# Patient Record
Sex: Male | Born: 1970 | Race: Black or African American | Hispanic: No | Smoking: Former smoker
Health system: Southern US, Community
[De-identification: ages and names within clinical notes are randomized; demographics above are authoritative.]

## PROBLEM LIST (undated history)

## (undated) DIAGNOSIS — Z8669 Personal history of other diseases of the nervous system and sense organs: Secondary | ICD-10-CM

## (undated) DIAGNOSIS — F329 Major depressive disorder, single episode, unspecified: Secondary | ICD-10-CM

## (undated) DIAGNOSIS — F419 Anxiety disorder, unspecified: Secondary | ICD-10-CM

## (undated) DIAGNOSIS — F32A Depression, unspecified: Secondary | ICD-10-CM

## (undated) DIAGNOSIS — T7840XA Allergy, unspecified, initial encounter: Secondary | ICD-10-CM

## (undated) DIAGNOSIS — K219 Gastro-esophageal reflux disease without esophagitis: Secondary | ICD-10-CM

## (undated) DIAGNOSIS — G4733 Obstructive sleep apnea (adult) (pediatric): Secondary | ICD-10-CM

## (undated) DIAGNOSIS — E7800 Pure hypercholesterolemia, unspecified: Secondary | ICD-10-CM

## (undated) DIAGNOSIS — F3342 Major depressive disorder, recurrent, in full remission: Secondary | ICD-10-CM

## (undated) DIAGNOSIS — I10 Essential (primary) hypertension: Secondary | ICD-10-CM

## (undated) DIAGNOSIS — R079 Chest pain, unspecified: Secondary | ICD-10-CM

## (undated) HISTORY — DX: Major depressive disorder, single episode, unspecified: F32.9

## (undated) HISTORY — DX: Depression, unspecified: F32.A

## (undated) HISTORY — DX: Major depressive disorder, recurrent, in full remission: F33.42

## (undated) HISTORY — DX: Allergy, unspecified, initial encounter: T78.40XA

## (undated) HISTORY — DX: Anxiety disorder, unspecified: F41.9

## (undated) HISTORY — DX: Obstructive sleep apnea (adult) (pediatric): G47.33

## (undated) HISTORY — PX: TONSILLECTOMY: SUR1361

## (undated) HISTORY — DX: Chest pain, unspecified: R07.9

## (undated) HISTORY — DX: Gastro-esophageal reflux disease without esophagitis: K21.9

## (undated) HISTORY — DX: Personal history of other diseases of the nervous system and sense organs: Z86.69

---

## 1998-03-12 ENCOUNTER — Encounter: Admission: RE | Admit: 1998-03-12 | Discharge: 1998-03-12 | Payer: Self-pay | Admitting: *Deleted

## 1998-03-14 ENCOUNTER — Encounter: Admission: RE | Admit: 1998-03-14 | Discharge: 1998-03-14 | Payer: Self-pay | Admitting: *Deleted

## 2000-07-05 HISTORY — PX: FOOT SURGERY: SHX648

## 2010-08-11 ENCOUNTER — Ambulatory Visit: Payer: Self-pay | Admitting: Internal Medicine

## 2011-08-10 ENCOUNTER — Ambulatory Visit: Payer: Self-pay | Admitting: Internal Medicine

## 2012-07-25 ENCOUNTER — Ambulatory Visit: Payer: Self-pay | Admitting: Internal Medicine

## 2015-02-03 ENCOUNTER — Emergency Department
Admission: EM | Admit: 2015-02-03 | Discharge: 2015-02-03 | Disposition: A | Discharge status: Home / Self Care | Attending: Emergency Medicine | Admitting: Emergency Medicine

## 2015-02-03 ENCOUNTER — Other Ambulatory Visit: Payer: Self-pay

## 2015-02-03 ENCOUNTER — Encounter: Payer: Self-pay | Admitting: *Deleted

## 2015-02-03 ENCOUNTER — Emergency Department

## 2015-02-03 DIAGNOSIS — R110 Nausea: Secondary | ICD-10-CM | POA: Insufficient documentation

## 2015-02-03 DIAGNOSIS — I10 Essential (primary) hypertension: Secondary | ICD-10-CM | POA: Diagnosis not present

## 2015-02-03 DIAGNOSIS — R079 Chest pain, unspecified: Secondary | ICD-10-CM | POA: Diagnosis not present

## 2015-02-03 HISTORY — DX: Essential (primary) hypertension: I10

## 2015-02-03 HISTORY — DX: Pure hypercholesterolemia, unspecified: E78.00

## 2015-02-03 LAB — BASIC METABOLIC PANEL
Anion gap: 10 (ref 5–15)
BUN: 6 mg/dL (ref 6–20)
CO2: 24 mmol/L (ref 22–32)
Calcium: 8.5 mg/dL — ABNORMAL LOW (ref 8.9–10.3)
Chloride: 99 mmol/L — ABNORMAL LOW (ref 101–111)
Creatinine, Ser: 1.04 mg/dL (ref 0.61–1.24)
GFR calc Af Amer: >60 mL/min (ref >60)
GFR calc non Af Amer: >60 mL/min (ref >60)
Glucose, Bld: 97 mg/dL (ref 65–99)
Potassium: 3.2 mmol/L — ABNORMAL LOW (ref 3.5–5.1)
Sodium: 133 mmol/L — ABNORMAL LOW (ref 135–145)

## 2015-02-03 LAB — CBC
HCT: 39.5 % — ABNORMAL LOW (ref 40.0–52.0)
Hemoglobin: 12.8 g/dL — ABNORMAL LOW (ref 13.0–18.0)
MCH: 21.5 pg — ABNORMAL LOW (ref 26.0–34.0)
MCHC: 32.4 g/dL (ref 32.0–36.0)
MCV: 66.3 fL — ABNORMAL LOW (ref 80.0–100.0)
Platelets: 293 10*3/uL (ref 150–440)
RBC: 5.96 MIL/uL — ABNORMAL HIGH (ref 4.40–5.90)
RDW: 15.5 % — ABNORMAL HIGH (ref 11.5–14.5)
WBC: 13.2 10*3/uL — ABNORMAL HIGH (ref 3.8–10.6)

## 2015-02-03 LAB — TROPONIN I: Troponin I: <0.03 ng/mL (ref <0.031)

## 2015-02-03 MED ORDER — CLONIDINE HCL 0.1 MG PO TABS
0.1000 mg | ORAL_TABLET | Freq: Once | ORAL | Status: AC
  Administered 2015-02-03: 0.1 mg via ORAL
  Filled 2015-02-03: qty 1

## 2015-02-03 MED ORDER — AZILSARTAN-CHLORTHALIDONE 40-25 MG PO TABS: 1.0000 | ORAL_TABLET | Freq: Every day | ORAL | Status: DC

## 2015-02-03 MED ORDER — RANITIDINE HCL 75 MG PO TABS
75.0000 mg | ORAL_TABLET | Freq: Two times a day (BID) | ORAL | Status: DC
Start: 2015-02-03 — End: 2018-03-27

## 2015-02-03 NOTE — Discharge Instructions (Signed)

## 2015-02-03 NOTE — ED Notes (Signed)
Patient with no complaints at this time. Respirations even and unlabored. Skin warm/dry. Discharge instructions reviewed with patient at this time. Patient given opportunity to voice concerns/ask questions. Patient discharged at this time and left Emergency Department with steady gait.   

## 2015-02-03 NOTE — ED Notes (Signed)
Pt states that for several months he has had mid chest/left sided chest pain that has been "on and off". Pt has had nausea with the cp. Denies dizziness or sob.

## 2015-02-03 NOTE — ED Provider Notes (Signed)
[facility] Emergency Department Provider Note  ____________________________________________  Time seen: Approximately 9:20 PM  I have reviewed the triage vital signs and the nursing notes.   HISTORY  Chief Complaint Chest Pain    HPI Joseph Wilcox is a 44 y.o. male with a history of hypertension and high cholesterol who presents with a chief complaint of chest pain which is been ongoing for the past several months. He says that the pain is sharp and can last anywhere from several minutes to several hours. It is on the left lower part of his chest. It is not exertional or worsened by movement. He says that he has been working out and engaging in physical activity without any chest pain or shortness of breath. He says that it can come on at random times, such as when he is sitting watching television. He also says that he thinks he has a history of reflux. He tries taking Tums for relief of his pain, but still wakes up in the middle the night with a burning type pain usually in his throat. He says that this is worsened when he drinks orange juice or has tomato sauce. Has had nausea but no vomiting. No diaphoresis. No shortness of breath. No history of cardiac disease in his family. Does have a remote smoking history but has not smoked in 2 years. Says that he sees Dr. Doy Hutching at the [place] [facility]. However, he has not taken his medications in one week because he says he is out of them. He is asymptomatic at this time. He said he came to the emergency department today because his coworkers as well as his wife encouraged him to because of his chest pain which has been ongoing for a long time. He has no history of cardiac disease in his family.  Past Medical History  Diagnosis Date  . Hypertension   . High cholesterol     There are no active problems to display for this patient.   Past Surgical History  Procedure Laterality Date  . Foot surgery    . Tonsillectomy       Current Outpatient Rx  Name  Route  Sig  Dispense  Refill  . Azilsartan-Chlorthalidone (EDARBYCLOR) 40-12.5 MG TABS   Oral   Take by mouth.           Allergies Review of patient's allergies indicates no known allergies.  No family history on file.  Social History History  Substance Use Topics  . Smoking status: Never Smoker   . Smokeless tobacco: Not on file  . Alcohol Use: Yes    Review of Systems Constitutional: No fever/chills Eyes: No visual changes. ENT: No sore throat. Cardiovascular: As above Respiratory: Denies shortness of breath. Gastrointestinal: No abdominal pain.  , no vomiting.  No diarrhea.  No constipation. Genitourinary: Negative for dysuria. Musculoskeletal: Negative for back pain. Skin: Negative for rash. Neurological: Negative for headaches, focal weakness or numbness.  10-point ROS otherwise negative.  ____________________________________________   PHYSICAL EXAM:  VITAL SIGNS: ED Triage Vitals  Enc Vitals Group     BP 02/03/15 1848 151/106 mmHg     Pulse Rate 02/03/15 1848 75     Resp 02/03/15 1858 18     Temp 02/03/15 1848 98.2 F (36.8 C)     Temp Source 02/03/15 1848 Oral     SpO2 02/03/15 1848 95 %     Weight 02/03/15 1848 240 lb (108.863 kg)     Height 02/03/15 1848 6'  1" (1.854 m)     Head Cir --      Peak Flow --      Pain Score 02/03/15 1846 7     Pain Loc --      Pain Edu? --      Excl. in [place]? --     Constitutional: Alert and oriented. Well appearing and in no acute distress. Eyes: Conjunctivae are normal. PERRL. EOMI. Head: Atraumatic. Nose: No congestion/rhinnorhea. Mouth/Throat: Mucous membranes are moist.  Oropharynx non-erythematous. Neck: No stridor.   Cardiovascular: Normal rate, regular rhythm. Grossly normal heart sounds.  Good peripheral circulation. reproducible chest pain over the left lateral chest. Air is no crepitus.   Respiratory: Normal respiratory effort.  No retractions. Lungs  CTAB. Gastrointestinal: Soft and nontender. No distention. No abdominal bruits. No CVA tenderness. Musculoskeletal: No lower extremity tenderness nor edema.  No joint effusions. Neurologic:  Normal speech and language. No gross focal neurologic deficits are appreciated. No gait instability. Skin:  Skin is warm, dry and intact. No rash noted. Psychiatric: Mood and affect are normal. Speech and behavior are normal.  ____________________________________________   LABS (all labs ordered are listed, but only abnormal results are displayed)  Labs Reviewed  BASIC METABOLIC PANEL - Abnormal; Notable for the following:    Sodium 133 (*)    Potassium 3.2 (*)    Chloride 99 (*)    Calcium 8.5 (*)    All other components within normal limits  CBC - Abnormal; Notable for the following:    WBC 13.2 (*)    RBC 5.96 (*)    Hemoglobin 12.8 (*)    HCT 39.5 (*)    MCV 66.3 (*)    MCH 21.5 (*)    RDW 15.5 (*)    All other components within normal limits  TROPONIN I   ____________________________________________  EKG  ED ECG REPORT I, Doran Stabler, the attending physician, personally viewed and interpreted this ECG.   Date: 02/03/2015  EKG Time: 1847  Rate: 80  Rhythm: normal sinus rhythm  Axis: Normal axis  Intervals: Incomplete right bundle-branch block.   ST&T Change:  No ST elevations or depressions. No abnormal T-wave inversions.  ____________________________________________  RADIOLOGYminimal chronic bronchitic changes. No acute abdomen. I personally reviewed these images. ____________________________________________   PROCEDURES    ____________________________________________   INITIAL IMPRESSION / ASSESSMENT AND PLAN / ED COURSE  Pertinent labs & imaging results that were available during my care of the patient were reviewed by me and considered in my medical decision making (see chart for details). patient chest pain atypical for cardiac chest pain. We will  refill the patient's medication as well as given a prescription for antacids. Encouraged the patient that he needs to follow-up with his primary care doctor as well as cardiology. Given numbers on the discharge instructions. Heart score of 2 which makes the patient appropriate for outpatient follow-up. _________________________________________   FINAL CLINICAL IMPRESSION(S) / ED DIAGNOSES  Acute chest pain. Initial visit.    Orbie Pyo, MD 02/03/15 2138

## 2015-06-04 ENCOUNTER — Ambulatory Visit: Payer: Self-pay | Admitting: Unknown Physician Specialty

## 2015-06-14 ENCOUNTER — Ambulatory Visit
Admission: EM | Admit: 2015-06-14 | Discharge: 2015-06-14 | Disposition: A | Discharge status: Home / Self Care | Attending: Family Medicine | Admitting: Family Medicine

## 2015-06-14 ENCOUNTER — Encounter: Payer: Self-pay | Admitting: Gynecology

## 2015-06-14 DIAGNOSIS — H6691 Otitis media, unspecified, right ear: Secondary | ICD-10-CM | POA: Diagnosis not present

## 2015-06-14 DIAGNOSIS — J209 Acute bronchitis, unspecified: Secondary | ICD-10-CM

## 2015-06-14 DIAGNOSIS — J0111 Acute recurrent frontal sinusitis: Secondary | ICD-10-CM

## 2015-06-14 DIAGNOSIS — H6592 Unspecified nonsuppurative otitis media, left ear: Secondary | ICD-10-CM | POA: Diagnosis not present

## 2015-06-14 MED ORDER — ALBUTEROL SULFATE HFA 108 (90 BASE) MCG/ACT IN AERS: 1.0000 | INHALATION_SPRAY | Freq: Four times a day (QID) | RESPIRATORY_TRACT | Status: DC | PRN

## 2015-06-14 MED ORDER — BENZONATATE 200 MG PO CAPS: 200.0000 mg | ORAL_CAPSULE | Freq: Three times a day (TID) | ORAL | Status: DC | PRN

## 2015-06-14 MED ORDER — FLUTICASONE PROPIONATE 50 MCG/ACT NA SUSP: 1.0000 | Freq: Two times a day (BID) | NASAL | Status: DC

## 2015-06-14 MED ORDER — AMOXICILLIN-POT CLAVULANATE 875-125 MG PO TABS: 1.0000 | ORAL_TABLET | Freq: Two times a day (BID) | ORAL | Status: AC

## 2015-06-14 MED ORDER — IPRATROPIUM-ALBUTEROL 0.5-2.5 (3) MG/3ML IN SOLN
3.0000 mL | Freq: Four times a day (QID) | RESPIRATORY_TRACT | Status: DC
  Administered 2015-06-14: 3 mL via RESPIRATORY_TRACT

## 2015-06-14 MED ORDER — SALINE SPRAY 0.65 % NA SOLN: 2.0000 | NASAL | Status: DC

## 2015-06-14 MED ORDER — PREDNISONE 20 MG PO TABS: 40.0000 mg | ORAL_TABLET | Freq: Every day | ORAL | Status: AC

## 2015-06-14 NOTE — ED Provider Notes (Signed)
CSN: [id number]     Arrival date & time 06/14/15  1242 History   First MD Initiated Contact with Patient 06/14/15 1406     Chief Complaint  Patient presents with  . 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  Hx asthma as a child no inhaler at home now.  Denied sick contacts headache right temple chills at night started Monday 5 Dec along with vomiting after coughing and decreased appetite has tried mucinex without an relief of symptoms and blood pressure has been up  FHx: HTN  PCM Dr Golden Pop  The history is provided by the patient.    Past Medical History  Diagnosis Date  . Hypertension   . High cholesterol    Past Surgical History  Procedure Laterality Date  . Foot surgery    . Tonsillectomy     History reviewed. No pertinent family history. Social History  Substance Use Topics  . Smoking status: Never Smoker   . Smokeless tobacco: None  . Alcohol Use: Yes    Review of Systems  Constitutional: Positive for chills and appetite change. Negative for fever, diaphoresis, activity change, fatigue and unexpected weight change.  HENT: Positive for congestion, postnasal drip and sinus pressure. Negative for dental problem, drooling, ear discharge, ear pain, facial swelling, hearing loss, mouth sores, nosebleeds, rhinorrhea, sneezing, sore throat, tinnitus, trouble swallowing and voice change.   Eyes: Negative for photophobia, pain, discharge, redness, itching and visual disturbance.  Respiratory: Positive for cough, chest tightness and wheezing. Negative for choking, shortness of breath and stridor.   Cardiovascular: Negative for chest pain, palpitations and leg swelling.  Gastrointestinal: Positive for vomiting. Negative for nausea, abdominal pain, diarrhea, constipation, blood in stool  and abdominal distention.  Endocrine: Negative for cold intolerance and heat intolerance.  Genitourinary: Negative for dysuria.  Musculoskeletal: Negative for myalgias, back pain, joint swelling, arthralgias, gait problem, neck pain and neck stiffness.  Skin: Negative for color change, pallor, rash and wound.  Allergic/Immunologic: Positive for environmental allergies. Negative for food allergies and immunocompromised state.  Neurological: Positive for headaches. Negative for dizziness, tremors, seizures, syncope, facial asymmetry, speech difficulty, weakness, light-headedness and numbness.  Hematological: Negative for adenopathy. Does not bruise/bleed easily.  Psychiatric/Behavioral: Positive for sleep disturbance. Negative for behavioral problems, confusion and agitation.    Allergies  Review of patient's allergies indicates no known allergies.  Home Medications   Prior to Admission medications   Medication Sig Start Date End Date Taking? Authorizing Provider  Azilsartan-Chlorthalidone (EDARBYCLOR) 40-25 MG TABS Take 1 tablet by mouth daily. 02/03/15  Yes Orbie Pyo, MD  albuterol (PROVENTIL HFA;VENTOLIN HFA) 108 (90 BASE) MCG/ACT inhaler Inhale 1-2 puffs into the lungs every 6 (six) hours as needed for wheezing or shortness of breath. 06/14/15   Olen Cordial, NP  amoxicillin-clavulanate (AUGMENTIN) 875-125 MG tablet Take 1 tablet by mouth every 12 (twelve) hours. 06/16/15 06/25/15  Olen Cordial, NP  benzonatate (TESSALON) 200 MG capsule Take 1 capsule (200 mg total) by mouth 3 (three) times daily as needed for cough. 06/14/15   Olen Cordial, NP  fluticasone (FLONASE) 50 MCG/ACT nasal spray Place 1 spray into both nostrils 2 (two) times daily. 06/14/15 07/05/15  Olen Cordial, NP  predniSONE (DELTASONE) 20 MG tablet Take 2 tablets (40 mg total) by mouth daily with breakfast. 06/15/15 06/19/15  Olen Cordial, NP  sodium chloride (OCEAN)  0.65 % SOLN nasal  spray Place 2 sprays into both nostrils every 2 (two) hours while awake. 06/14/15   Olen Cordial, NP   Meds Ordered and Administered this Visit   Medications - No data to display  BP 145/103 mmHg  Pulse 70  Temp(Src) 98.4 F (36.9 C) (Oral)  Resp 18  Ht 6\' 1"  (1.854 m)  Wt 235 lb (106.595 kg)  BMI 31.01 kg/m2  SpO2 100% No data found.   Physical Exam  Constitutional: He is oriented to person, place, and time. Vital signs are normal. He appears well-developed and well-nourished. He is active and cooperative.  Non-toxic appearance. He does not have a sickly appearance. He appears ill. No distress.  HENT:  Head: Normocephalic and atraumatic.  Right Ear: Hearing, external ear and ear canal normal. Tympanic membrane is injected, erythematous and bulging. Tympanic membrane is not perforated. A middle ear effusion is present.  Left Ear: Hearing, external ear and ear canal normal. Tympanic membrane is bulging. Tympanic membrane is not injected, not perforated and not erythematous. A middle ear effusion is present.  Nose: Mucosal edema and rhinorrhea present. No nose lacerations, sinus tenderness, nasal deformity, septal deviation or nasal septal hematoma. No epistaxis.  No foreign bodies. Right sinus exhibits frontal sinus tenderness. Right sinus exhibits no maxillary sinus tenderness. Left sinus exhibits frontal sinus tenderness. Left sinus exhibits no maxillary sinus tenderness.  Mouth/Throat: Uvula is midline and mucous membranes are normal. Mucous membranes are not pale, not dry and not cyanotic. He does not have dentures. No oral lesions. No trismus in the jaw. Normal dentition. No dental abscesses, uvula swelling, lacerations or dental caries. Posterior oropharyngeal edema and posterior oropharyngeal erythema present. No oropharyngeal exudate or tonsillar abscesses.  Cobblestoning posterior pharynx; right TM bulging with erythema/injection slight opacity air fluid level; bilateral nasal  turbinates with edema/bogginess clear/yellow discharge/congestion; left TM slight opacity air fluid level bulging  Eyes: Conjunctivae, EOM and lids are normal. Pupils are equal, round, and reactive to light. Right eye exhibits no chemosis, no discharge, no exudate and no hordeolum. No foreign body present in the right eye. Left eye exhibits no chemosis, no discharge, no exudate and no hordeolum. No foreign body present in the left eye. Right conjunctiva is not injected. Right conjunctiva has no hemorrhage. Left conjunctiva is not injected. Left conjunctiva has no hemorrhage. No scleral icterus. Right eye exhibits normal extraocular motion and no nystagmus. Left eye exhibits normal extraocular motion and no nystagmus. Right pupil is round and reactive. Left pupil is round and reactive. Pupils are equal.  Neck: Trachea normal and normal range of motion. Neck supple. No tracheal tenderness, no spinous process tenderness and no muscular tenderness present. No rigidity. No tracheal deviation, no edema, no erythema and normal range of motion present. No thyroid mass and no thyromegaly present.  Cardiovascular: Normal rate, regular rhythm, S1 normal, S2 normal, normal heart sounds and intact distal pulses.  PMI is not displaced.  Exam reveals no gallop and no friction rub.   No murmur heard. Pulmonary/Chest: Effort normal. No stridor. No respiratory distress. He has decreased breath sounds in the right lower field and the left lower field. He has wheezes in the right upper field and the left upper field. He has rhonchi in the right middle field and the left middle field. He has no rales.  Fine inspiratory wheeze; short rhonchi clear with cough negative egophany all fields  Abdominal: Soft. Bowel sounds are normal. He exhibits no shifting dullness, no  distension, no pulsatile liver, no fluid wave, no abdominal bruit, no ascites, no pulsatile midline mass and no mass. There is no tenderness. There is no rigidity, no  guarding, no tenderness at McBurney's point and negative Murphy's sign. Hernia confirmed negative in the ventral area.  Dull to percussion x 4 quads  Musculoskeletal: Normal range of motion. He exhibits no edema or tenderness.       Right shoulder: Normal.       Left shoulder: Normal.       Right elbow: Normal.      Left elbow: Normal.       Right knee: Normal.       Left knee: Normal.       Right ankle: Normal.       Left ankle: Normal.       Right hand: Normal.       Left hand: Normal.  Lymphadenopathy:       Head (right side): No submental, no submandibular, no tonsillar, no preauricular, no posterior auricular and no occipital adenopathy present.       Head (left side): No submental, no submandibular, no tonsillar, no preauricular, no posterior auricular and no occipital adenopathy present.    He has no cervical adenopathy.       Right cervical: No superficial cervical, no deep cervical and no posterior cervical adenopathy present.      Left cervical: No superficial cervical, no deep cervical and no posterior cervical adenopathy present.  Neurological: He is alert and oriented to person, place, and time. He displays no atrophy and no tremor. No cranial nerve deficit or sensory deficit. He exhibits normal muscle tone. He displays no seizure activity. Coordination and gait normal. GCS eye subscore is 4. GCS verbal subscore is 5. GCS motor subscore is 6.  Skin: Skin is warm, dry and intact. No abrasion, no bruising, no burn, no ecchymosis, no laceration, no lesion, no petechiae and no rash noted. He is not diaphoretic. No cyanosis or erythema. No pallor. Nails show no clubbing.  Psychiatric: He has a normal mood and affect. His speech is normal and behavior is normal. Judgment and thought content normal. Cognition and memory are normal.  Nursing note and vitals reviewed.   ED Course  Procedures (including critical care time)  Labs Review Labs Reviewed - No data to display  Imaging  Review No results found. 1454 duoneb administered by RN Tula Nakayama  [phone] feels less chest congestion after duoneb; phlegm looser still has fine wheeze and occasional rhonchi  sp02  98% room air BP 142/85 HR 74   Patient to monitor blood pressure at home and follow up with PCM discussed goal 140/90 or less avoid sudafed/motrin/aleve/advil/naproxen as counteracts his blood pressure medication  Discussed albuterol can cause hypokalemia along with his blood pressure medication discussed potassium rich foods signs/symptoms hypokalemia and to follow up for re-evaluation if these symptoms occur.  Patient verbalized understanding of information/instructions, agreed with plan of care and had no further questions at this time.   MDM   1. Acute recurrent frontal sinusitis   2. Acute bronchitis, unspecified organism   3. Acute right otitis media, recurrence not specified, unspecified otitis media type   4. Otitis media with effusion, left    Start augmentin 875mg  po BID x 10 days for otitis media right.  Supportive treatment.   No evidence of invasive bacterial infection, non toxic and well hydrated.  This is most likely self limiting viral infection.  I do not see  where any further testing or imaging is necessary at this time.   I will suggest supportive care, rest, good hygiene and encourage the patient to take adequate fluids.  The patient is to return to clinic or EMERGENCY ROOM if symptoms worsen or change significantly e.g. ear pain, fever, purulent discharge from ears or bleeding.  Exitcare handout on otitis media with effusion and otitis media given to patient.  Patient verbalized agreement and understanding of treatment plan.    Suspect Viral illness: no evidence of invasive bacterial infection, non toxic and well hydrated.  This is most likely self limiting viral infection.  I do not see where any further testing or imaging is necessary at this time.   I will suggest supportive care, rest, good  hygiene and encourage the patient to take adequate fluids.  Does not require work excuse.  Rx flonase 1 spray each nostril BID prn, nasal saline 1-2 sprays each nostril prn q2h, tylenol 1000mg  po QID prn pain/fever.  Avoid motrin/naproxen as counteracts blood pressure medications.  Tessalon pearles 200mg  po TID prn cough.  Prednisone 40mg  po daily x 5 days history of asthma as child.  Albuterol 1-2 puffs po q4-6h prn cough/wheezing/chest tightness.  Discussed honey with lemon and salt water gargles for comfort also.  The patient is to return to clinic or EMERGENCY ROOM if symptoms worsen or change significantly e.g. fever, lethargy, SOB, wheezing.  Exitcare handout on viral illness given to patient.  Patient verbalized agreement and understanding of treatment plan.    Start flonase if no relief in symptoms x 48 hours please contact me.No evidence of systemic bacterial infection, non toxic and well hydrated.  I do not see where any further testing or imaging is necessary at this time.   I will suggest supportive care, rest, good hygiene and encourage the patient to take adequate fluids.  The patient is to return to clinic or EMERGENCY ROOM if symptoms worsen or change significantly.  Exitcare handout on sinusitis given to patient.  Patient verbalized agreement and understanding of treatment plan and had no further questions at this time.   P2:  Hand washing and cover cough  Rx albuterol inhaler demonstrated use with patient good relief from duoneb and start prednisone 40mg  po with breakfast tomorrow x 5 days.  Bronchitis simple, community acquired, may have started as viral (probably respiratory syncytial, parainfluenza, influenza, or adenovirus), but now evidence of acute purulent bronchitis with resultant bronchial edema and mucus formation.  Viruses are the most common cause of bronchial inflammation in otherwise healthy adults with acute bronchitis.  The appearance of sputum is not predictive of whether a  bacterial infection is present.  Purulent sputum is most often caused by viral infections.  There are a small portion of those caused by non-viral agents being Mycoplamsa pneumonia.  Microscopic examination or C&S of sputum in the healthy adult with acute bronchitis is generally not helpful (usually negative or normal respiratory flora) other considerations being cough from upper respiratory tract infections, sinusitis or allergic syndromes (mild asthma or viral pneumonia).  Differential Diagnosis:  reactive airway disease (asthma, allergic aspergillosis (eosinophilia), chronic bronchitis, respiratory infection (Sinusitis, Common cold, pneumonia), congestive heart failure, reflux esophagitis, bronchogenic tumor, aspiration syndromes and/or exposure irritants/tobacco smoke.  In this case, there is no evidence of any invasive bacterial illness.  Most likely viral etiology so will hold on antibiotic treatment.  Advise supportive care with rest, encourage fluids, good hygiene and watch for any worsening symptoms.  If they were  to develop:  come back to the office or go to the emergency room if after hours.  Without high fever, severe dyspnea, lack of physical findings or other risk factors, I will hold on a chest radiograph and CBC at this time.  I discussed that approximately 50% of patients with acute bronchitis have a cough that lasts up to three weeks, and 25% for over a month.  Tylenol, one to two tablets every four hours as needed for fever or myalgias.   No aspirin.  Patient instructed to follow up in one week or sooner if symptoms worsen. Patient verbalized agreement and understanding of treatment plan.  P2:  hand washing and cover cough   Olen Cordial, NP 06/15/15 [phone]

## 2015-06-14 NOTE — ED Notes (Signed)
Patient c/o cough / congestion / chills / nasal drainage x 2 weeks

## 2015-06-14 NOTE — Discharge Instructions (Signed)
Acute Bronchitis °Bronchitis is inflammation of the airways that extend from the windpipe into the lungs (bronchi). The inflammation often causes mucus to develop. This leads to a cough, which is the most common symptom of bronchitis.  °In acute bronchitis, the condition usually develops suddenly and goes away over time, usually in a couple weeks. Smoking, allergies, and asthma can make bronchitis worse. Repeated episodes of bronchitis may cause further lung problems.  °CAUSES °Acute bronchitis is most often caused by the same virus that causes a cold. The virus can spread from person to person (contagious) through coughing, sneezing, and touching contaminated objects. °SIGNS AND SYMPTOMS  °· Cough.   °· Fever.   °· Coughing up mucus.   °· Body aches.   °· Chest congestion.   °· Chills.   °· Shortness of breath.   °· Sore throat.   °DIAGNOSIS  °Acute bronchitis is usually diagnosed through a physical exam. Your health care provider will also ask you questions about your medical history. Tests, such as chest X-rays, are sometimes done to rule out other conditions.  °TREATMENT  °Acute bronchitis usually goes away in a couple weeks. Oftentimes, no medical treatment is necessary. Medicines are sometimes given for relief of fever or cough. Antibiotic medicines are usually not needed but may be prescribed in certain situations. In some cases, an inhaler may be recommended to help reduce shortness of breath and control the cough. A cool mist vaporizer may also be used to help thin bronchial secretions and make it easier to clear the chest.  °HOME CARE INSTRUCTIONS °· Get plenty of rest.   °· Drink enough fluids to keep your urine clear or pale yellow (unless you have a medical condition that requires fluid restriction). Increasing fluids may help thin your respiratory secretions (sputum) and reduce chest congestion, and it will prevent dehydration.   °· Take medicines only as directed by your health care provider. °· If  you were prescribed an antibiotic medicine, finish it all even if you start to feel better. °· Avoid smoking and secondhand smoke. Exposure to cigarette smoke or irritating chemicals will make bronchitis worse. If you are a smoker, consider using nicotine gum or skin patches to help control withdrawal symptoms. Quitting smoking will help your lungs heal faster.   °· Reduce the chances of another bout of acute bronchitis by washing your hands frequently, avoiding people with cold symptoms, and trying not to touch your hands to your mouth, nose, or eyes.   °· Keep all follow-up visits as directed by your health care provider.   °SEEK MEDICAL CARE IF: °Your symptoms do not improve after 1 week of treatment.  °SEEK IMMEDIATE MEDICAL CARE IF: °· You develop an increased fever or chills.   °· You have chest pain.   °· You have severe shortness of breath. °· You have bloody sputum.   °· You develop dehydration. °· You faint or repeatedly feel like you are going to pass out. °· You develop repeated vomiting. °· You develop a severe headache. °MAKE SURE YOU:  °· Understand these instructions. °· Will watch your condition. °· Will get help right away if you are not doing well or get worse. °  °This information is not intended to replace advice given to you by your health care provider. Make sure you discuss any questions you have with your health care provider. °  °Document Released: 07/29/2004 Document Revised: 07/12/2014 Document Reviewed: 12/12/2012 °Elsevier Interactive Patient Education ©2016 [employer] °Sinusitis, Adult °Sinusitis is redness, soreness, and inflammation of the paranasal sinuses. Paranasal sinuses are air pockets within the   bones of your face. They are located beneath your eyes, in the middle of your forehead, and above your eyes. In healthy paranasal sinuses, mucus is able to drain out, and air is able to circulate through them by way of your nose. However, when your paranasal sinuses are inflamed,  mucus and air can become trapped. This can allow bacteria and other germs to grow and cause infection. °Sinusitis can develop quickly and last only a short time (acute) or continue over a long period (chronic). Sinusitis that lasts for more than 12 weeks is considered chronic. °CAUSES °Causes of sinusitis include: °· Allergies. °· Structural abnormalities, such as displacement of the cartilage that separates your nostrils (deviated septum), which can decrease the air flow through your nose and sinuses and affect sinus drainage. °· Functional abnormalities, such as when the small hairs (cilia) that line your sinuses and help remove mucus do not work properly or are not present. °SIGNS AND SYMPTOMS °Symptoms of acute and chronic sinusitis are the same. The primary symptoms are pain and pressure around the affected sinuses. Other symptoms include: °· Upper toothache. °· Earache. °· Headache. °· Bad breath. °· Decreased sense of smell and taste. °· A cough, which worsens when you are lying flat. °· Fatigue. °· Fever. °· Thick drainage from your nose, which often is green and may contain pus (purulent). °· Swelling and warmth over the affected sinuses. °DIAGNOSIS °Your health care provider will perform a physical exam. During your exam, your health care provider may perform any of the following to help determine if you have acute sinusitis or chronic sinusitis: °· Look in your nose for signs of abnormal growths in your nostrils (nasal polyps). °· Tap over the affected sinus to check for signs of infection. °· View the inside of your sinuses using an imaging device that has a light attached (endoscope). °If your health care provider suspects that you have chronic sinusitis, one or more of the following tests may be recommended: °· Allergy tests. °· Nasal culture. A sample of mucus is taken from your nose, sent to a lab, and screened for bacteria. °· Nasal cytology. A sample of mucus is taken from your nose and examined by  your health care provider to determine if your sinusitis is related to an allergy. °TREATMENT °Most cases of acute sinusitis are related to a viral infection and will resolve on their own within 10 days. Sometimes, medicines are prescribed to help relieve symptoms of both acute and chronic sinusitis. These may include pain medicines, decongestants, nasal steroid sprays, or saline sprays. °However, for sinusitis related to a bacterial infection, your health care provider will prescribe antibiotic medicines. These are medicines that will help kill the bacteria causing the infection. °Rarely, sinusitis is caused by a fungal infection. In these cases, your health care provider will prescribe antifungal medicine. °For some cases of chronic sinusitis, surgery is needed. Generally, these are cases in which sinusitis recurs more than 3 times per year, despite other treatments. °HOME CARE INSTRUCTIONS °· Drink plenty of water. Water helps thin the mucus so your sinuses can drain more easily. °· Use a humidifier. °· Inhale steam 3-4 times a day (for example, sit in the bathroom with the shower running). °· Apply a warm, moist washcloth to your face 3-4 times a day, or as directed by your health care provider. °· Use saline nasal sprays to help moisten and clean your sinuses. °· Take medicines only as directed by your health care provider. °· If   you were prescribed either an antibiotic or antifungal medicine, finish it all even if you start to feel better. °SEEK IMMEDIATE MEDICAL CARE IF: °· You have increasing pain or severe headaches. °· You have nausea, vomiting, or drowsiness. °· You have swelling around your face. °· You have vision problems. °· You have a stiff neck. °· You have difficulty breathing. °  °This information is not intended to replace advice given to you by your health care provider. Make sure you discuss any questions you have with your health care provider. °  °Document Released: 06/21/2005 Document  Revised: 07/12/2014 Document Reviewed: 07/06/2011 °Elsevier Interactive Patient Education ©2016 Else[employer] Inc[employer] °Otitis Media With Effusion °Otitis media with effusion is the presence of fluid in the middle ear. This is a common problem in children, which often follows ear infections. It may be present for weeks or longer after the infection. Unlike an acute ear infection, otitis media with effusion refers only to fluid behind the ear drum and not infection. Children with repeated ear and sinus infections and allergy problems are the most likely to get otitis media with effusion. °CAUSES  °The most frequent cause of the fluid buildup is dysfunction of the eustachian tubes. These are the tubes that drain fluid in the ears to the back of the nose (nasopharynx). °SYMPTOMS  °· The main symptom of this condition is hearing loss. As a result, you or your child may: °· Listen to the TV at a loud volume. °· Not respond to questions. °· Ask "what" often when spoken to. °· Mistake or confuse one sound or word for another. °· There may be a sensation of fullness or pressure but usually not pain. °DIAGNOSIS  °· Your health care provider will diagnose this condition by examining you or your child's ears. °· Your health care provider may test the pressure in you or your child's ear with a tympanometer. °· A hearing test may be conducted if the problem persists. °TREATMENT  °· Treatment depends on the duration and the effects of the effusion. °· Antibiotics, decongestants, nose drops, and cortisone-type drugs (tablets or nasal spray) may not be helpful. °· Children with persistent ear effusions may have delayed language or behavioral problems. Children at risk for developmental delays in hearing, learning, and speech may require referral to a specialist earlier than children not at risk. °· You or your child's health care provider may suggest a referral to an ear, nose, and throat surgeon for treatment. The following may help  restore normal hearing: °· Drainage of fluid. °· Placement of ear tubes (tympanostomy tubes). °· Removal of adenoids (adenoidectomy). °HOME CARE INSTRUCTIONS  °· Avoid secondhand smoke. °· Infants who are breastfed are less likely to have this condition. °· Avoid feeding infants while they are lying flat. °· Avoid known environmental allergens. °· Avoid people who are sick. °SEEK MEDICAL CARE IF:  °· Hearing is not better in 3 months. °· Hearing is worse. °· Ear pain. °· Drainage from the ear. °· Dizziness. °MAKE SURE YOU:  °· Understand these instructions. °· Will watch your condition. °· Will get help right away if you are not doing well or get worse. °  °This information is not intended to replace advice given to you by your health care provider. Make sure you discuss any questions you have with your health care provider. °  °Document Released: 07/29/2004 Document Revised: 07/12/2014 Document Reviewed: 01/16/2013 °Elsevier Interactive Patient Education ©2016 Elsevier Inc. ° °

## 2015-10-09 ENCOUNTER — Encounter: Payer: Self-pay | Admitting: Family Medicine

## 2015-10-09 ENCOUNTER — Ambulatory Visit (INDEPENDENT_AMBULATORY_CARE_PROVIDER_SITE_OTHER): Admitting: Family Medicine

## 2015-10-09 VITALS — BP 140/90 | HR 80 | Resp 16 | Ht 73.0 in | Wt 244.0 lb

## 2015-10-09 DIAGNOSIS — F419 Anxiety disorder, unspecified: Secondary | ICD-10-CM

## 2015-10-09 DIAGNOSIS — R12 Heartburn: Secondary | ICD-10-CM | POA: Diagnosis not present

## 2015-10-09 DIAGNOSIS — F329 Major depressive disorder, single episode, unspecified: Secondary | ICD-10-CM

## 2015-10-09 DIAGNOSIS — F3342 Major depressive disorder, recurrent, in full remission: Secondary | ICD-10-CM

## 2015-10-09 DIAGNOSIS — K219 Gastro-esophageal reflux disease without esophagitis: Secondary | ICD-10-CM | POA: Insufficient documentation

## 2015-10-09 DIAGNOSIS — I10 Essential (primary) hypertension: Secondary | ICD-10-CM | POA: Insufficient documentation

## 2015-10-09 DIAGNOSIS — F32A Depression, unspecified: Secondary | ICD-10-CM | POA: Insufficient documentation

## 2015-10-09 DIAGNOSIS — I159 Secondary hypertension, unspecified: Secondary | ICD-10-CM

## 2015-10-09 DIAGNOSIS — F3341 Major depressive disorder, recurrent, in partial remission: Secondary | ICD-10-CM | POA: Insufficient documentation

## 2015-10-09 MED ORDER — PRAVASTATIN SODIUM 20 MG PO TABS: 20.0000 mg | ORAL_TABLET | Freq: Every day | ORAL | Status: DC

## 2015-10-09 MED ORDER — AZILSARTAN-CHLORTHALIDONE 40-25 MG PO TABS: 1.0000 | ORAL_TABLET | Freq: Every day | ORAL | Status: DC

## 2015-10-09 NOTE — Addendum Note (Signed)
Addended by: Theresia Majors A on: 10/09/2015 03:50 PM   Modules accepted: Orders

## 2015-10-09 NOTE — Progress Notes (Signed)
Name: Joseph Wilcox   MRN: [id number]    DOB: Jan 03, 1971   Date:10/09/2015       Progress Note  Subjective  Chief Complaint  Chief Complaint  Patient presents with  . Establish Care    Transfer from [place] at [facility]  . Anxiety  . Depression    HPI Here to establish care.  Has issues with depression and anxiety over past year.  Getting much worse over past 2 weeks.  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  His in the [employer].  He is feeling very anxious about being deployed back to [place] that is planned in 5 months.  Her has had brief fleeting thought of suicide, but made no plans.  Appetite ok now.  Trouble sleeping 2-3 years, getting worse past few weeks.  Sex drive ok.  Feels anxiuos, and sad and blue. Has had some panic episodes recently. No problem-specific assessment & plan notes found for this encounter.   Past Medical History  Diagnosis Date  . Hypertension   . High cholesterol   . Allergy   . Depression   . GERD (gastroesophageal reflux disease)   . Anxiety   . Hx of migraines     Past Surgical History  Procedure Laterality Date  . Foot surgery  2002    Bone spur  . Tonsillectomy      Family History  Problem Relation Age of Onset  . Diabetes Mother     Social History   Social History  . Marital Status: Unknown    Spouse Name: N/A  . Number of Children: N/A  . Years of Education: N/A   Occupational History  . Not on file.   Social History Main Topics  . Smoking status: Former Smoker    Quit date: 07/05/2010  . Smokeless tobacco: Not on file  . Alcohol Use: Yes  . Drug Use: No  . Sexual Activity: Not on file   Other Topics Concern  . Not on file   Social History Narrative     Current outpatient prescriptions:  .  Azilsartan-Chlorthalidone (EDARBYCLOR) 40-25 MG TABS, Take 1 tablet by mouth daily., Disp: 30 tablet, Rfl: 0 .  pravastatin (PRAVACHOL) 20 MG tablet, Take 20 mg by mouth daily., Disp: , Rfl:   .  [DISCONTINUED] ranitidine (ZANTAC 75) 75 MG tablet, Take 1 tablet (75 mg total) by mouth 2 (two) times daily., Disp: 60 tablet, Rfl: 0  No Known Allergies   Review of Systems  Constitutional: Negative for fever, chills, weight loss and malaise/fatigue.  HENT: Negative for hearing loss.   Eyes: Negative for blurred vision and double vision.  Respiratory: Negative for cough, shortness of breath and wheezing.   Cardiovascular: Negative for chest pain, palpitations and leg swelling.  Gastrointestinal: Negative for heartburn, abdominal pain and blood in stool.  Genitourinary: Negative for dysuria, urgency and frequency.  Musculoskeletal: Negative for myalgias, back pain and neck pain.  Skin: Negative for rash.  Neurological: Negative for weakness and headaches.      Objective  Filed Vitals:   10/09/15 1423 10/09/15 1506  BP: 141/97 140/90  Pulse: 80   Resp: 16   Height: 6\' 1"  (1.854 m)   Weight: 244 lb (110.678 kg)     Physical Exam  Constitutional: He is oriented to person, place, and time and well-developed, well-nourished, and in no distress. No distress.  HENT:  Head: Normocephalic and atraumatic.  Eyes: Conjunctivae and EOM are normal. Pupils are  equal, round, and reactive to light. No scleral icterus.  Neck: Normal range of motion. Neck supple. Carotid bruit is not present. No thyromegaly present.  Cardiovascular: Normal rate and regular rhythm.  Exam reveals friction rub. Exam reveals no gallop.   No murmur heard. Pulmonary/Chest: Effort normal and breath sounds normal. No respiratory distress. He has no wheezes. He has no rales.  Abdominal: Soft. Bowel sounds are normal. He exhibits no distension, no abdominal bruit and no mass. There is no tenderness.  Musculoskeletal: He exhibits no edema.  Lymphadenopathy:    He has no cervical adenopathy.  Neurological: He is alert and oriented to person, place, and time.  Psychiatric:  Affect mildly anxious and depressed.   PHQ-9 score of 20.  GAD score of 21  Vitals reviewed.      No results found for this or any previous visit (from the past 2160 hour(s)).   Assessment & Plan  Problem List Items Addressed This Visit      Cardiovascular and Mediastinum   High blood pressure   Relevant Medications   pravastatin (PRAVACHOL) 20 MG tablet     Other   Heartburn   Depression - Primary   Relevant Orders   Ambulatory referral to Psychiatry   Acute anxiety   Relevant Orders   Ambulatory referral to Psychiatry      Meds ordered this encounter  Medications  . pravastatin (PRAVACHOL) 20 MG tablet    Sig: Take 20 mg by mouth daily.   1. Depression  - Ambulatory referral to Psychiatry  2. Heartburn   3. Secondary hypertension, unspecified Cont meds.  4. Acute anxiety  - Ambulatory referral to Psychiatry

## 2015-10-14 ENCOUNTER — Telehealth: Payer: Self-pay | Admitting: Family Medicine

## 2015-10-14 NOTE — Telephone Encounter (Signed)
Referral has been changed to [place]

## 2015-10-14 NOTE — Telephone Encounter (Signed)
Joseph Wilcox is not taking [employer] so he needs a referral to [facility].  They require 2 office visits worth of notes so he scheduled an appt on May 20th with Dr. Luan Pulling.  His call back number is [phone]

## 2015-10-23 ENCOUNTER — Encounter: Payer: Self-pay | Admitting: Family Medicine

## 2015-10-23 ENCOUNTER — Ambulatory Visit (INDEPENDENT_AMBULATORY_CARE_PROVIDER_SITE_OTHER): Admitting: Family Medicine

## 2015-10-23 VITALS — BP 114/74 | HR 82 | Resp 16 | Ht 72.0 in | Wt 243.0 lb

## 2015-10-23 DIAGNOSIS — F329 Major depressive disorder, single episode, unspecified: Secondary | ICD-10-CM

## 2015-10-23 DIAGNOSIS — F32A Depression, unspecified: Secondary | ICD-10-CM

## 2015-10-23 DIAGNOSIS — F419 Anxiety disorder, unspecified: Secondary | ICD-10-CM | POA: Diagnosis not present

## 2015-10-23 MED ORDER — DULOXETINE HCL 30 MG PO CPEP: 30.0000 mg | ORAL_CAPSULE | Freq: Every day | ORAL | Status: DC

## 2015-10-23 NOTE — Progress Notes (Signed)
Name: Joseph Wilcox   MRN: [id number]    DOB: 02/12/1971   Date:10/23/2015       Progress Note  Subjective  Chief Complaint  Chief Complaint  Patient presents with  . Anxiety    Appt with [facility]  10/30/2015    HPI For f/u of anxiety and depression.  Has not seen Psych yet.  Has appt. At [facility] next week for his official psychiatric evaluation..  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  He has not had any serious suicidal thoughts.  Wishes "everyting would go away".    No problem-specific assessment & plan notes found for this encounter.   Past Medical History  Diagnosis Date  . Hypertension   . High cholesterol   . Allergy   . Depression   . GERD (gastroesophageal reflux disease)   . Anxiety   . Hx of migraines     Past Surgical History  Procedure Laterality Date  . Foot surgery  2002    Bone spur  . Tonsillectomy      Family History  Problem Relation Age of Onset  . Diabetes Mother     Social History   Social History  . Marital Status: Unknown    Spouse Name: N/A  . Number of Children: N/A  . Years of Education: N/A   Occupational History  . Not on file.   Social History Main Topics  . Smoking status: Former Smoker    Quit date: 07/05/2010  . Smokeless tobacco: Not on file  . Alcohol Use: Yes  . Drug Use: No  . Sexual Activity: Not on file   Other Topics Concern  . Not on file   Social History Narrative     Current outpatient prescriptions:  .  Azilsartan-Chlorthalidone (EDARBYCLOR) 40-25 MG TABS, Take 1 tablet by mouth daily., Disp: 30 tablet, Rfl: 6 .  pravastatin (PRAVACHOL) 20 MG tablet, Take 1 tablet (20 mg total) by mouth daily., Disp: 30 tablet, Rfl: 6 .  DULoxetine (CYMBALTA) 30 MG capsule, Take 1 capsule (30 mg total) by mouth daily., Disp: 30 capsule, Rfl: 3 .  [DISCONTINUED] ranitidine (ZANTAC 75) 75 MG tablet, Take 1 tablet (75 mg total) by mouth 2 (two) times daily., Disp: 60 tablet, Rfl:  0  Not on File   Review of Systems  Constitutional: Positive for malaise/fatigue. Negative for fever, chills and weight loss.  HENT: Negative for hearing loss.   Eyes: Negative for blurred vision and double vision.  Respiratory: Negative for cough, shortness of breath and wheezing.   Cardiovascular: Negative for chest pain, palpitations and leg swelling.  Gastrointestinal: Negative for heartburn, abdominal pain and blood in stool.  Genitourinary: Negative for dysuria, urgency and frequency.  Musculoskeletal: Negative for myalgias and joint pain.  Neurological: Negative for dizziness, tingling, tremors, weakness and headaches.  Psychiatric/Behavioral: Positive for depression. The patient is nervous/anxious and has insomnia.       Objective  Filed Vitals:   10/23/15 1608  BP: 114/74  Pulse: 82  Resp: 16  Height: 6' (1.829 m)  Weight: 243 lb (110.224 kg)    Physical Exam  Constitutional: He is oriented to person, place, and time and well-developed, well-nourished, and in no distress. No distress.  HENT:  Head: Normocephalic and atraumatic.  Eyes: Conjunctivae and EOM are normal. Pupils are equal, round, and reactive to light. No scleral icterus.  Neck: Normal range of motion. Neck supple. No thyromegaly present.  Cardiovascular: Normal rate,  regular rhythm and normal heart sounds.  Exam reveals no gallop and no friction rub.   No murmur heard. Pulmonary/Chest: Effort normal and breath sounds normal. No respiratory distress. He has no wheezes. He has no rales.  Abdominal: Soft. Bowel sounds are normal. He exhibits no distension and no mass. There is no tenderness.  Musculoskeletal: He exhibits no edema.  Lymphadenopathy:    He has no cervical adenopathy.  Neurological: He is alert and oriented to person, place, and time.  Psychiatric:  PHQ_-9 score of 19.  Affect mildly depressed today with hx. Of anxiety  Vitals reviewed.      No results found for this or any  previous visit (from the past 2160 hour(s)).   Assessment & Plan  Problem List Items Addressed This Visit      Other   Depression - Primary   Relevant Medications   DULoxetine (CYMBALTA) 30 MG capsule   Acute anxiety   Relevant Medications   DULoxetine (CYMBALTA) 30 MG capsule      Meds ordered this encounter  Medications  . DULoxetine (CYMBALTA) 30 MG capsule    Sig: Take 1 capsule (30 mg total) by mouth daily.    Dispense:  30 capsule    Refill:  3   1. Depression  - DULoxetine (CYMBALTA) 30 MG capsule; Take 1 capsule (30 mg total) by mouth daily.  Dispense: 30 capsule; Refill: 3 Keep appt. With [facility]. next week  2. Acute anxiety  - DULoxetine (CYMBALTA) 30 MG capsule; Take 1 capsule (30 mg total) by mouth daily.  Dispense: 30 capsule; Refill: 3

## 2015-10-30 ENCOUNTER — Ambulatory Visit (INDEPENDENT_AMBULATORY_CARE_PROVIDER_SITE_OTHER): Admitting: Licensed Clinical Social Worker

## 2015-10-30 ENCOUNTER — Encounter: Payer: Self-pay | Admitting: Licensed Clinical Social Worker

## 2015-10-30 DIAGNOSIS — F4010 Social phobia, unspecified: Secondary | ICD-10-CM | POA: Diagnosis not present

## 2015-10-30 DIAGNOSIS — F411 Generalized anxiety disorder: Secondary | ICD-10-CM | POA: Insufficient documentation

## 2015-10-30 DIAGNOSIS — F332 Major depressive disorder, recurrent severe without psychotic features: Secondary | ICD-10-CM | POA: Insufficient documentation

## 2015-10-30 NOTE — Progress Notes (Signed)
Comprehensive Clinical Assessment (CCA) Note  10/30/2015 Joseph Wilcox [id number]  Visit Diagnosis:      ICD-9-CM ICD-10-CM   1. Generalized anxiety disorder, panic attack specifier 300.02 F41.1   2. Severe episode of recurrent major depressive disorder, without psychotic features ([place]) 296.33 F33.2   3. Social anxiety disorder 300.23 F40.10      CCA Part One  Part One has been completed on paper by the patient.  (See scanned document in Chart Review)  CCA Part Two A  Intake/Chief Complaint:  CCA Intake With Chief Complaint CCA Part Two Date: 10/30/15 CCA Part Two Time: 1344 Chief Complaint/Presenting Problem: He is in the [employer] and he was doing training. His anxiety level was going through the roof where he was having panic attacks. He couldn't focus and function that led to withdrawing from school. He has had anxiety all his life but worse this year. Panic attacks are more recent. He always have anxiety and stage fright. He has the heavy breathing and can't remember things. The really bad panic attacks happened in March Patients Currently Reported Symptoms/Problems: Trouble sleeping, trouble focusing, when he walks around the house he has to remember where things are and it is like he has been gone for awhile. Everything gives him anxiety. He works on the computer and phone so every time he has a phone call he gets anxiety, anytime he gets an e-mail he gets anxious. Lack of interest in things that used to interest him.  Collateral Involvement: no Individual's Strengths: Get along with anyone, dedicated, making sure the job gets done, probably to a fault Individual's Preferences: therapy, medication management Individual's Abilities: Dedicated and making sue the job gets done. Patient is nice Type of Services Patient Feels Are Needed: therapy, medication management Initial Clinical Notes/Concerns: Seeing a counselor at work and he feels it is helping, no other prior  treatment  Mental Health Symptoms Depression:  Depression: Change in energy/activity, Difficulty Concentrating, Fatigue, Hopelessness, Increase/decrease in appetite, Irritability, Sleep (too much or little), Tearfulness, Worthlessness (not suicidal but would like for everything to go away, denies SI, Past SA, frustrated when couldn't function at school and felt like punching himself )  Mania:  Mania: N/A  Anxiety:   Anxiety: Difficulty concentrating, Fatigue, Irritability, Restlessness, Sleep, Tension, Worrying. Worrying is daily and interfering with functioning  Psychosis:  Psychosis: N/A  Trauma:  Trauma: N/A  Obsessions:  Obsessions: N/A  Compulsions:  Compulsions: N/A  Inattention:  Inattention: Disorganized, Forgetful, Loses things, Symptoms before age 75, Symptoms present in 2 or more settings (Diagnosed with ADD before age 41, on Ritalin, diagnosed at vocational rehab at 78)  Hyperactivity/Impulsivity:  Hyperactivity/Impulsivity: N/A  Oppositional/Defiant Behaviors:     Borderline Personality:  Emotional Irregularity: N/A  Other Mood/Personality Symptoms:  Other Mood/Personality Symptoms: Panic attacks-feels like shortness of breath, startled, rapid heart beat, jittery, crying, chest pain or discomfort, abdominal distress, light headed, Only a few minutes. Last one 2 weeks ago. Usually some stressor brings it on. One is being in front of people. He always had stage fright but started to problematic . Anytime he is being looked and judged this will bring on anxiety in different social setting.     Mental Status Exam Appearance and self-care  Stature:  Stature: Tall  Weight:  Weight: Average weight  Clothing:  Clothing: Casual  Grooming:  Grooming: Normal  Cosmetic use:  Cosmetic Use: None  Posture/gait:  Posture/Gait: Normal  Motor activity:  Motor Activity: Not Remarkable  Sensorium  Attention:  Attention: Normal  Concentration:  Concentration: Normal  Orientation:  Orientation:  Object, Person, Place, Situation, Time  Recall/memory:  Recall/Memory: Normal  Affect and Mood  Affect:  Affect: Appropriate  Mood:  Mood: Anxious, Depressed  Relating  Eye contact:  Eye Contact: Normal  Facial expression:  Facial Expression: Responsive  Attitude toward examiner:  Attitude Toward Examiner: Cooperative  Thought and Language  Speech flow: Speech Flow: Normal  Thought content:  Thought Content: Appropriate to mood and circumstances  Preoccupation:     Hallucinations:     Organization:     Transport planner of Knowledge:  Fund of Knowledge: Average  Intelligence:  Intelligence: Average  Abstraction:  Abstraction: Normal  Judgement:  Judgement: Fair  Art therapist:  Reality Testing: Realistic  Insight:  Insight: Fair  Decision Making:  Decision Making: Paralyzed  Social Functioning  Social Maturity:  Social Maturity: Responsible  Social Judgement:  Social Judgement: Normal  Stress  Stressors:  Stressors: Work Contractor, unknown where he is going, 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)  Coping Ability:  Coping Ability: Software engineer, Research officer, political party Deficits:     Supports:      Family and Psychosocial History: Family history Marital status: Married Number of Years Married: 65 What types of issues is patient dealing with in the relationship?: none Additional relationship information: Patient lives with wife and 83 year old Are you sexually active?: Yes What is your sexual orientation?: heterosexual Has your sexual activity been affected by drugs, alcohol, medication, or emotional stress?: no Does patient have children?: Yes How many children?: 3 How is patient's relationship with their children?: 21, 20, 16-good relationship  Childhood History:  Childhood History By whom was/is the patient raised?: Mother Additional childhood history information: he thinks it was good. He grew up with mom, aunts raised him, grandfather did not want  anything to do with him, he was born out of wedlock and father a Theme park manager and didn't want anything to do with him. There was no relationship Description of patient's relationship with caregiver when they were a child: mom-great, dad-no relationship.  Patient's description of current relationship with people who raised him/her: mom-great, social issues mean she does not see as much, talks to mom about 2-3 times a year, father sees 2-3 times a year, relationship with father is aright How were you disciplined when you got in trouble as a child/adolescent?: talking to was enough, when he got in trouble he had to hear it from everybody. With grandfather they tried not to discipline because grandfather went overboard.  Does patient have siblings?: Yes Number of Siblings: 2 Description of patient's current relationship with siblings: 2 half brothers on fathers side, younger, it is an okay relationship Did patient suffer any verbal/emotional/physical/sexual abuse as a child?: No Did patient suffer from severe childhood neglect?: No Has patient ever been sexually abused/assaulted/raped as an adolescent or adult?: No Was the patient ever a victim of a crime or a disaster?: No Witnessed domestic violence?: No Has patient been effected by domestic violence as an adult?: No  CCA Part Two B  Employment/Work Situation: Employment / Work Copywriter, advertising Employment situation: Employed Where is patient currently employed?: [employer] How long has patient been employed?: 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 He is all over the place, he puts stuff off because he  feels that he can't deal with then, so does something else and comes back to it. What is the longest time patient has a held a job?: 10 years  Where was the patient employed at that time?:  [employer] Has patient ever been in the [employer]?: Yes (Describe in comment) Has patient ever served in combat?: Yes Patient description of combat service: deployed to [place] 2009-2010 Did You Receive Any Psychiatric Treatment/Services While in the [employer]?: No Are There Guns or Other Weapons in [place]?: Yes Types of Guns/Weapons: 1 gun Are These Psychologist, educational?: Yes  Education: Education School Currently Attending: He was going to a school required from [employer] and he had to drop out Last Grade Completed: 11 (has a GED) Name of [place]: [place] Did Teacher, adult education From [employer]?: Yes Did You Attend College?: Yes What Type of College Degree Do you Have?: Novelty Did You Attend Graduate School?: No What Was Your Major?: business Did You Have Any Special Interests In School?: none Did You Have An Individualized Education Program (IIEP): No Did You Have Any Difficulty At School?: Yes (ADD) Were Any Medications Ever Prescribed For These Difficulties?: Yes Medications Prescribed For School Difficulties?: Ritalin  Religion: Religion/Spirituality Are You A Religious Person?: Yes What is Your Religious Affiliation?: Non-Denominational How Might This Affect Treatment?: affect in a positive way  Leisure/Recreation: Leisure / Recreation Leisure and Hobbies: reading, Chief Operating Officer read, drink coffee, likes to ride around looking at house or real estate, travel, spend time with family, movies  Exercise/Diet: Exercise/Diet Do You Exercise?: Yes What Type of Exercise Do You Do?: Weight Training, Run/Walk How Many Times a Week Do You Exercise?: 1-3 times a week Have You Gained or Lost A Significant Amount of Weight in the Past Six Months?: No Do You Follow a Special Diet?: No Do You Have Any Trouble Sleeping?: Yes Explanation of Sleeping Difficulties: falling asleep, hard to stay asleep,    CCA Part Two C  Alcohol/Drug Use: Alcohol / Drug Use Pain Medications: n/a Prescriptions: see med list Over the Counter: see med list History of alcohol / drug use?: No history of alcohol / drug abuse                      CCA Part Three  ASAM's:  Six Dimensions of Multidimensional Assessment  Dimension 1:  Acute Intoxication and/or Withdrawal Potential:     Dimension 2:  Biomedical Conditions and Complications:     Dimension 3:  Emotional, Behavioral, or Cognitive Conditions and Complications:     Dimension 4:  Readiness to Change:     Dimension 5:  Relapse, Continued use, or Continued Problem Potential:     Dimension 6:  Recovery/Living Environment:      Substance use Disorder (SUD)    Social Function:  Social Functioning Social Maturity: Responsible Social Judgement: Normal  Stress:  Stress Stressors: Work Contractor, unknown where he is going, 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) Coping Ability: Overwhelmed, Exhausted Patient Takes Medications The Way The Doctor Instructed?: Yes Priority Risk: Low Acuity  Risk Assessment- Self-Harm Potential: Risk Assessment For Self-Harm Potential Thoughts of Self-Harm: No current thoughts Method: No plan Availability of Means: Have close by  Risk Assessment -Dangerous to Others Potential: Risk Assessment For Dangerous to Others Potential Method: No Plan Availability of Means: No access or NA Intent: Vague intent or NA Notification Required: No need or identified person  DSM5 Diagnoses: Patient Active Problem List   Diagnosis Date Noted  . Generalized anxiety disorder, panic attack specifier 10/30/2015  . Major depressive disorder, recurrent episode, severe ([place]) 10/30/2015  . Social anxiety disorder 10/30/2015  . Depression 10/09/2015  . Heartburn 10/09/2015  . High blood pressure 10/09/2015  . Acute anxiety 10/09/2015    Patient Centered Plan: Patient is on the following Treatment Plan(s):   Anxiety, Depression and Low Self-Esteem  Recommendations for Services/Supports/Treatments: Recommendations for Services/Supports/Treatments Recommendations For Services/Supports/Treatments: Individual Therapy, Medication Management  Treatment Plan Summary: Patient is a 45 year old married male who is in the [employer] and scheduled for deployment in 5-6 months. His anxiety level was going through the roof where he was having panic attacks. He couldn't focus and function that led to withdrawing from school. He has had anxiety all his life but worse this year. 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 He reports current symptoms as trouble sleeping, trouble focusing, when he walks around the house he has to remember where things are and it is like he has been gone for awhile. Everything gives him anxiety and specifically anxiety related to work duties. He has lack of interest in things that used to interest him. Additional anxiety symptoms include difficulty concentrating, fatigue, irritability, restlessness, tension,and worry daily that is interfering with his functioning. He describes symptoms of a panic attack that include shortness of breath, startled, rapid heart beat, jittery, crying, chest pain or discomfort, abdominal distress, light headed, they last only a few minutes. The last one was 2 weeks ago. Usually some stressor brings it on. One of the stressors is being in front of people. He always had stage fright but recently the panic has started to be problematic . Anytime he is being looked and judged this will bring on anxiety in different social settings. Additional depressive symptoms include difficulty concentrating, fatigue, hopelessness, problems with appetite, Irritability, sleep problems, tearfulness, and worthlessness. Denies SI but would like for everything to go away, denies SI, past SA, and frustrated when couldn't function at school that led to withdrawing and "I felt like  punching myself" but did not act on thoughts. Patient is recommended for individual therapy to work on coping skills for stressors and strategies to manage mental health symptoms as well as medication management.         Referrals to Alternative Service(s): Referred to Alternative Service(s):   Place:   Date:   Time:    Referred to Alternative Service(s):   Place:   Date:   Time:    Referred to Alternative Service(s):   Place:   Date:   Time:    Referred to Alternative Service(s):   Place:   Date:   Time:     Tekesha Almgren A

## 2015-11-11 ENCOUNTER — Ambulatory Visit (INDEPENDENT_AMBULATORY_CARE_PROVIDER_SITE_OTHER): Admitting: Licensed Clinical Social Worker

## 2015-11-11 DIAGNOSIS — F411 Generalized anxiety disorder: Secondary | ICD-10-CM | POA: Diagnosis not present

## 2015-11-11 DIAGNOSIS — F332 Major depressive disorder, recurrent severe without psychotic features: Secondary | ICD-10-CM | POA: Diagnosis not present

## 2015-11-11 DIAGNOSIS — F4010 Social phobia, unspecified: Secondary | ICD-10-CM

## 2015-11-11 NOTE — Progress Notes (Signed)
   THERAPIST PROGRESS NOTE  Session Time: 3:00 PM-3:55 PM  Participation Level: Active  Behavioral Response: CasualAlertDysphoric  Type of Therapy: Individual Therapy  Treatment Goals addressed: Anxiety, Coping and Diagnosis: MDD, recurrent, severe, Generalized Anxiety Disorder, Social Anxiety, improve mood, challenge unhelpful thought patterns, strategies to manage anxiety  Interventions: CBT, Supportive and Other: psychoeducation on panic, coping of panic, mindfulness  Summary: Joseph Wilcox is a 45 y.o. male who presents with saying that it has been about the same. He relates that last Thursday was particularly bad. 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 He describes his symptoms as anxiety being high, tightness in the chest, trouble breathing, and these anxiety symptoms did not subside until Friday. He has trouble sleeping, and even when he went out with family he looked forward to coming home. He is realizing that the work environment is a main cause of stress and people are starting to notice that people are leaving because of the stress in the work environment. He is drained when he gets home. Co-worker is going through it too. Discussed ways to help manage stress such as doing something enjoyable and something he can look forward too.. Patient said he enjoys spending time with family but now he just wants to go home if they go out to do something.  Yesterday depressed and today better so the medicine is helping. He also realizes that he is getting better about little things that use to cause him anxiety. He sees that his thoughts are triggers and anxiety trigger for panic attacks. Patient related that he is always thinking about the future and what he has to do next. He talked to a therapist at work who encouraged him to think about present. He also realizes that he has to push back and let other people do some of the work. Patient discussed possible changes that will make  his work environment better. It is recommended that he not be deployed and this has helped with stress. He also has found when he faces things it isn't as bad as he thought. Discussed that he does use breathing technique when he starts to feel worked up. Patient summarized session and related that he is learning techniques to help with anxiety and panic, strategies to manage depression,  better understanding of panic and letting go of unhelpful thoughts.   Suicidal/Homicidal: No  Therapist Response: Therapist encouraged patent to look at causes of anxiety  that include his stressful environment that contribute to anxiety. Recognizing his work environment as stressful helped validate patient's feelings and patient was also encouraged to implement activities into his weekly schedule that he enjoys. Therapist educated patient on the causes of panic and started techniques to manage panic and prevent symptoms from escalating. This includes not focusing on internal symptoms, not catastrophizing the symptoms, not avoiding, not practicing in safety behaviors and developing a healthy attitude toward panic. Therapist encouraged patient to challenge unhelpful thoughts. Therapist introduced relaxation and mindfulness. Completed treatment plan with patient and his focus is to decrease anxiety and depression.     Plan: Return again in 2week.2.Patient read handout on mindfulness. 3.Patient monitor thoughts and challenge unhelpful thoughts  Diagnosis: Axis I: Generalized Anxiety Disorder, Major Depression, Recurrent severe and Social Anxiety    Axis II: n/a    Daaiel Starlin A, LCSW 11/11/2015

## 2015-11-13 ENCOUNTER — Ambulatory Visit: Admitting: Family Medicine

## 2015-11-19 ENCOUNTER — Encounter: Payer: Self-pay | Admitting: Psychiatry

## 2015-11-19 ENCOUNTER — Ambulatory Visit (INDEPENDENT_AMBULATORY_CARE_PROVIDER_SITE_OTHER): Admitting: Psychiatry

## 2015-11-19 VITALS — BP 122/82 | HR 92 | Temp 97.9°F | Ht 72.0 in | Wt 245.2 lb

## 2015-11-19 DIAGNOSIS — F32A Depression, unspecified: Secondary | ICD-10-CM

## 2015-11-19 DIAGNOSIS — F331 Major depressive disorder, recurrent, moderate: Secondary | ICD-10-CM

## 2015-11-19 DIAGNOSIS — F329 Major depressive disorder, single episode, unspecified: Secondary | ICD-10-CM

## 2015-11-19 DIAGNOSIS — F419 Anxiety disorder, unspecified: Secondary | ICD-10-CM

## 2015-11-19 MED ORDER — DULOXETINE HCL 60 MG PO CPEP: 60.0000 mg | ORAL_CAPSULE | Freq: Every day | ORAL | Status: DC

## 2015-11-19 NOTE — Progress Notes (Signed)
Psychiatric Initial Adult Assessment   Patient Identification: Joseph Wilcox MRN:  [id number] Date of Evaluation:  11/19/2015 Referral Source: [facility]  Chief Complaint:   Chief Complaint    Establish Care; Anxiety; Panic Attack; Stress; Fatigue; Other     Visit Diagnosis:    ICD-9-CM ICD-10-CM   1. MDD (major depressive disorder), recurrent episode, moderate (HCC) 296.32 F33.1   2. Depression 311 F32.9 DULoxetine (CYMBALTA) 60 MG capsule  3. Acute anxiety 300.00 F41.9 DULoxetine (CYMBALTA) 60 MG capsule    History of Present Illness:    Patient is a 45 year old married male who is currently in [employer] presented for initial assessment. He was referred by his primary care physician Dr. Georgiann Cocker. Patient reported that he has been having panic attacks since March. He reported that he has history of depression and anxiety and has been feeling overwhelmed at work. He reported that he feels stressed out and overwhelmed. He went to [place] last year and he did not enjoy this trip. He stated that he was spending most of the time in his hotel room after the conference. He stated that he was also sent to a work-related trip in [place] but he was unable to complete the conference. Patient reported that he has been in the [employer] for the past 17 years. Patient reported that he started having panic attacks for the past couple of months. He feels that his chest is hurting and has breathing difficulties. He was started on Cymbalta 30 mg by his primary care physician Dr. Georgiann Cocker. He also started having fleeting suicidal ideations and sometimes think about shooting himself with a gun. He currently owns a gun and it is locked in his cabinet. He reported that he has access to the gun. He reported that he was having these thoughts for 1-2 times per week. Last time he thought about it was Monday. However he has never acted on his thoughts. He feels that the Cymbalta is helping him and his depression  and suicidal thoughts are getting better. Patient currently denied having any suicidal ideations at this time. He reported that he is going to give away his gun and will call his friend at work as soon as he believes or practices. He does not want to discuss with his wife as she will get worried and anxious. He denied having any perceptual disturbances. He denied using any drugs or alcohol at this time. Associated Signs/Symptoms: Depression Symptoms:  depressed mood, insomnia, fatigue, difficulty concentrating, hopelessness, anxiety, panic attacks, loss of energy/fatigue, disturbed sleep, weight loss, (Hypo) Manic Symptoms:  Distractibility, Irritable Mood, Anxiety Symptoms:  Excessive Worry, Panic Symptoms, Psychotic Symptoms:  none PTSD Symptoms: Negative NA  Past Psychiatric History:   He  reported that he has never seen a psychiatrist in the past. He was never admitted to a psychiatric hospital. He has just started taking the Cymbalta.  He reported that he was also diagnosed with ADHD when he had the evaluation done by the vocational rehabilitation in the past. He was taking Ritalin  However he was not prescribed medication for many years and was looking for the prescription. He is currently following with a therapist Patty at the [facility]. He is seeing her on a weekly basis. He reported that he is improving with his therapy and she is helpful.  Previous Psychotropic Medications:  Recently started on Cymbalta.  Substance Abuse History in the last 12 months:  Yes.   Drinks  alcohol socially.  Consequences of Substance Abuse:  Negative NA  Past Medical History:  Past Medical History  Diagnosis Date  . Hypertension   . High cholesterol   . Allergy   . Depression   . GERD (gastroesophageal reflux disease)   . Anxiety   . Hx of migraines     Past Surgical History  Procedure Laterality Date  . Foot surgery  2002    Bone spur  . Tonsillectomy      Family  Psychiatric History:  Patient denied any family history of psychiatric illness.  Family History:  Family History  Problem Relation Age of Onset  . Diabetes Mother     Social History:   Social History   Social History  . Marital Status: Unknown    Spouse Name: N/A  . Number of Children: N/A  . Years of Education: N/A   Social History Main Topics  . Smoking status: Former Smoker    Quit date: 07/05/2010  . Smokeless tobacco: Never Used  . Alcohol Use: No  . Drug Use: No  . Sexual Activity: Yes    Birth Control/ Protection: None   Other Topics Concern  . None   Social History Narrative    Additional Social History:  She is currently married for the past 17 years. He has 3 children ages 2120 and 45. 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 He was posted in [place] and [place] in the past. 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 He is again going to be required so on in October to [place], [place] or [place] and is worried about the same  Allergies:  No Known Allergies  Metabolic Disorder Labs: No results found for: HGBA1C, MPG No results found for: PROLACTIN No results found for: CHOL, TRIG, HDL, CHOLHDL, VLDL, LDLCALC   Current Medications: Current Outpatient Prescriptions  Medication Sig Dispense Refill  . Azilsartan-Chlorthalidone (EDARBYCLOR) 40-25 MG TABS Take 1 tablet by mouth daily. 30 tablet 6  . DULoxetine (CYMBALTA) 60 MG capsule Take 1 capsule (60 mg total) by mouth daily. 30 capsule 0  . pravastatin (PRAVACHOL) 20 MG tablet Take 1 tablet (20 mg total) by mouth daily. 30 tablet 6  . [DISCONTINUED] ranitidine (ZANTAC 75) 75 MG tablet Take 1 tablet (75 mg total) by mouth 2 (two) times daily. 60 tablet 0   No current facility-administered medications for this visit.    Neurologic: Headache: No Seizure:  No Paresthesias:No  Musculoskeletal: Strength & Muscle Tone: within normal limits Gait & Station: normal Patient leans: N/A  Psychiatric Specialty Exam: ROS  Blood pressure 122/82, pulse 92, temperature 97.9 F (36.6 C), temperature source Tympanic, height 6' (1.829 m), weight 245 lb 3.2 oz (111.222 kg), SpO2 96 %.Body mass index is 33.25 kg/(m^2).  General Appearance: Meticulous  Eye Contact:  Fair  Speech:  Clear and Coherent and Normal Rate  Volume:  Normal  Mood:  Anxious  Affect:  Congruent  Thought Process:  Coherent and Goal Directed  Orientation:  Full (Time, Place, and Person)  Thought Content:  WDL  Suicidal Thoughts:  No  Homicidal Thoughts:  No  Memory:  Immediate;   Fair  Judgement:  Fair  Insight:  Fair  Psychomotor Activity:  Normal  Concentration:  Fair  Recall:  [employer] of Knowledge:Fair  Language: Fair  Akathisia:  No  Handed:  Right  AIMS (if indicated):    Assets:  Communication Skills Desire for Improvement Physical Health Social Support Talents/Skills Transportation  ADL's:  Intact  Cognition: WNL  Sleep:      Treatment Plan Summary: Medication management  Patient reported that he is taking over-the-counter sleeping aid to help him sleep at night. It is effective. He will be started on Cymbalta 60 mg daily to help with his depression and anxiety symptoms. We discussed at length about removing the guns from the house and he reported that he does not want his wife or his family members to be called. Patient will go to his house this morning and will remove the gun. He will call us within a few hours to let us know that he has removed the gun. He will also sign a release of information for [place] to contact Patty  his therapist.  Patient will follow up in 2 weeks or earlier depending on his symptoms   More than 50% of the time spent in psychoeducation, counseling and coordination of care.    This note was generated in part or whole with  voice recognition software. Voice regonition is usually quite accurate but there are transcription errors that can and very often do occur. I apologize for any typographical errors that were not detected and corrected.    Rainey Pines, MD 5/17/20179:40 AM

## 2015-11-25 ENCOUNTER — Ambulatory Visit: Admitting: Licensed Clinical Social Worker

## 2015-11-28 ENCOUNTER — Ambulatory Visit (INDEPENDENT_AMBULATORY_CARE_PROVIDER_SITE_OTHER): Admitting: Licensed Clinical Social Worker

## 2015-11-28 DIAGNOSIS — F331 Major depressive disorder, recurrent, moderate: Secondary | ICD-10-CM

## 2015-12-03 ENCOUNTER — Encounter: Payer: Self-pay | Admitting: Psychiatry

## 2015-12-03 ENCOUNTER — Ambulatory Visit (INDEPENDENT_AMBULATORY_CARE_PROVIDER_SITE_OTHER): Admitting: Psychiatry

## 2015-12-03 VITALS — BP 142/98 | HR 98 | Temp 97.0°F | Ht 72.0 in | Wt 253.2 lb

## 2015-12-03 DIAGNOSIS — F32A Depression, unspecified: Secondary | ICD-10-CM

## 2015-12-03 DIAGNOSIS — F329 Major depressive disorder, single episode, unspecified: Secondary | ICD-10-CM | POA: Diagnosis not present

## 2015-12-03 DIAGNOSIS — F331 Major depressive disorder, recurrent, moderate: Secondary | ICD-10-CM | POA: Diagnosis not present

## 2015-12-03 DIAGNOSIS — F411 Generalized anxiety disorder: Secondary | ICD-10-CM | POA: Diagnosis not present

## 2015-12-03 DIAGNOSIS — F419 Anxiety disorder, unspecified: Secondary | ICD-10-CM | POA: Diagnosis not present

## 2015-12-03 MED ORDER — DULOXETINE HCL 60 MG PO CPEP: 60.0000 mg | ORAL_CAPSULE | Freq: Every day | ORAL | Status: DC

## 2015-12-03 MED ORDER — TRAZODONE HCL 100 MG PO TABS: 100.0000 mg | ORAL_TABLET | Freq: Every evening | ORAL | Status: DC | PRN

## 2015-12-03 NOTE — Progress Notes (Deleted)
[facility] MD/PA/NP OP Progress Note  12/03/2015 9:03 AM Joseph Wilcox  MRN:  [id number]  Chief Complaint:  Chief Complaint    Follow-up; Medication Refill     Subjective:  *** HPI: *** Visit Diagnosis: No diagnosis found.  Past Psychiatric History: ***  Past Medical History:  Past Medical History  Diagnosis Date  . Hypertension   . High cholesterol   . Allergy   . Depression   . GERD (gastroesophageal reflux disease)   . Anxiety   . Hx of migraines     Past Surgical History  Procedure Laterality Date  . Foot surgery  2002    Bone spur  . Tonsillectomy      Family Psychiatric History: ***  Family History:  Family History  Problem Relation Age of Onset  . Diabetes Mother     Social History:  Social History   Social History  . Marital Status: Unknown    Spouse Name: N/A  . Number of Children: N/A  . Years of Education: N/A   Social History Main Topics  . Smoking status: Former Smoker    Quit date: 07/05/2010  . Smokeless tobacco: Never Used  . Alcohol Use: No  . Drug Use: No  . Sexual Activity: Yes    Birth Control/ Protection: None   Other Topics Concern  . None   Social History Narrative    Allergies: No Known Allergies  Metabolic Disorder Labs: No results found for: HGBA1C, MPG No results found for: PROLACTIN No results found for: CHOL, TRIG, HDL, CHOLHDL, VLDL, LDLCALC   Current Medications: Current Outpatient Prescriptions  Medication Sig Dispense Refill  . Azilsartan-Chlorthalidone (EDARBYCLOR) 40-25 MG TABS Take 1 tablet by mouth daily. 30 tablet 6  . DULoxetine (CYMBALTA) 60 MG capsule Take 1 capsule (60 mg total) by mouth daily. 30 capsule 0  . pravastatin (PRAVACHOL) 20 MG tablet Take 1 tablet (20 mg total) by mouth daily. 30 tablet 6  . [DISCONTINUED] ranitidine (ZANTAC 75) 75 MG tablet Take 1 tablet (75 mg total) by mouth 2 (two) times daily. 60 tablet 0   No current facility-administered medications for this visit.     Neurologic: Headache: {BHH YES OR NO:22294} Seizure: {BHH YES OR NO:22294} Paresthesias: {BHH YES OR NO:22294}  Musculoskeletal: Strength & Muscle Tone: {desc; muscle tone:32375} Gait & Station: {PE GAIT ED NATL:22525} Patient leans: {Patient Leans:21022755}  Psychiatric Specialty Exam: ROS  Blood pressure 142/98, pulse 98, temperature 97 F (36.1 C), temperature source Tympanic, height 6' (1.829 m), weight 253 lb 3.2 oz (114.851 kg), SpO2 94 %.Body mass index is 34.33 kg/(m^2).  General Appearance: {Appearance:22683}  Eye Contact:  {BHH EYE CONTACT:22684}  Speech:  {Speech:22685}  Volume:  {Volume (PAA):22686}  Mood:  {BHH MOOD:22306}  Affect:  {Affect (PAA):22687}  Thought Process:  {Thought Process (PAA):22688}  Orientation:  {BHH ORIENTATION (PAA):22689}  Thought Content: {Thought Content:22690}   Suicidal Thoughts:  {ST/HT (PAA):22692}  Homicidal Thoughts:  {ST/HT (PAA):22692}  Memory:  {BHH MEMORY:22881}  Judgement:  {Judgement (PAA):22694}  Insight:  {Insight (PAA):22695}  Psychomotor Activity:  {Psychomotor (PAA):22696}  Concentration:  {Concentration:21399}  Recall:  {BHH GOOD/FAIR/POOR:22877}  Fund of Knowledge: {BHH GOOD/FAIR/POOR:22877}  Language: {BHH GOOD/FAIR/POOR:22877}  Akathisia:  {BHH YES OR NO:22294}  Handed:  {Handed:22697}  AIMS (if indicated):  ***  Assets:  {Assets (PAA):22698}  ADL's:  {BHH TW:9249394  Cognition: {chl bhh cognition:304700322}  Sleep:  ***     Treatment Plan Summary:{CHL AMB BH MD TX OC:[id number]   Rainey Pines, MD 12/03/2015,  9:03 AM  Psychiatric Initial Adult Assessment   Patient Identification: Joseph Wilcox MRN:  [id number] Date of Evaluation:  12/03/2015 Referral Source: [facility]  Chief Complaint:   Chief Complaint    Follow-up; Medication Refill     Visit Diagnosis:    ICD-9-CM ICD-10-CM   1. MDD (major depressive disorder), recurrent episode, moderate (HCC) 296.32 F33.1   2. Generalized  anxiety disorder 300.02 F41.1     History of Present Illness:    Patient is a 45 year old married male who is currently in [employer] presented for initial assessment. He was referred by his primary care physician Dr. Georgiann Cocker. Patient reported that he has been having panic attacks since March. He reported that he has history of depression and anxiety and has been feeling overwhelmed at work. He reported that he feels stressed out and overwhelmed. He went to [place] last year and he did not enjoy this trip. He stated that he was spending most of the time in his hotel room after the conference. He stated that he was also sent to a work-related trip in [place] but he was unable to complete the conference. Patient reported that he has been in the [employer] for the past 17 years. Patient reported that he started having panic attacks for the past couple of months. He feels that his chest is hurting and has breathing difficulties. He was started on Cymbalta 30 mg by his primary care physician Dr. Georgiann Cocker. He also started having fleeting suicidal ideations and sometimes think about shooting himself with a gun. He currently owns a gun and it is locked in his cabinet. He reported that he has access to the gun. He reported that he was having these thoughts for 1-2 times per week. Last time he thought about it was Monday. However he has never acted on his thoughts. He feels that the Cymbalta is helping him and his depression and suicidal thoughts are getting better. Patient currently denied having any suicidal ideations at this time. He reported that he is going to give away his gun and will call his friend at work as soon as he believes or practices. He does not want to discuss with his wife as she will get worried and anxious. He denied having any perceptual disturbances. He denied using any drugs or alcohol at this time. Associated Signs/Symptoms: Depression Symptoms:  depressed  mood, insomnia, fatigue, difficulty concentrating, hopelessness, anxiety, panic attacks, loss of energy/fatigue, disturbed sleep, weight loss, (Hypo) Manic Symptoms:  Distractibility, Irritable Mood, Anxiety Symptoms:  Excessive Worry, Panic Symptoms, Psychotic Symptoms:  none PTSD Symptoms: Negative NA  Past Psychiatric History:   He  reported that he has never seen a psychiatrist in the past. He was never admitted to a psychiatric hospital. He has just started taking the Cymbalta.  He reported that he was also diagnosed with ADHD when he had the evaluation done by the vocational rehabilitation in the past. He was taking Ritalin  However he was not prescribed medication for many years and was looking for the prescription. He is currently following with a therapist Patty at the [facility]. He is seeing her on a weekly basis. He reported that he is improving with his therapy and she is helpful.  Previous Psychotropic Medications:  Recently started on Cymbalta.  Substance Abuse History in the last 12 months:  Yes.   Drinks  alcohol socially.  Consequences of Substance Abuse: Negative NA  Past Medical History:  Past Medical History  Diagnosis Date  .  Hypertension   . High cholesterol   . Allergy   . Depression   . GERD (gastroesophageal reflux disease)   . Anxiety   . Hx of migraines     Past Surgical History  Procedure Laterality Date  . Foot surgery  2002    Bone spur  . Tonsillectomy      Family Psychiatric History:  Patient denied any family history of psychiatric illness.  Family History:  Family History  Problem Relation Age of Onset  . Diabetes Mother     Social History:   Social History   Social History  . Marital Status: Unknown    Spouse Name: N/A  . Number of Children: N/A  . Years of Education: N/A   Social History Main Topics  . Smoking status: Former Smoker    Quit date: 07/05/2010  . Smokeless tobacco: Never Used  . Alcohol  Use: No  . Drug Use: No  . Sexual Activity: Yes    Birth Control/ Protection: None   Other Topics Concern  . None   Social History Narrative    Additional Social History:  She is currently married for the past 17 years. He has 3 children ages 2120 and 64. 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 He was posted in [place] and [place] in the past. 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 He is again going to be required so on in October to [place], [place] or [place] and is worried about the same  Allergies:  No Known Allergies  Metabolic Disorder Labs: No results found for: HGBA1C, MPG No results found for: PROLACTIN No results found for: CHOL, TRIG, HDL, CHOLHDL, VLDL, LDLCALC   Current Medications: Current Outpatient Prescriptions  Medication Sig Dispense Refill  . Azilsartan-Chlorthalidone (EDARBYCLOR) 40-25 MG TABS Take 1 tablet by mouth daily. 30 tablet 6  . DULoxetine (CYMBALTA) 60 MG capsule Take 1 capsule (60 mg total) by mouth daily. 30 capsule 0  . pravastatin (PRAVACHOL) 20 MG tablet Take 1 tablet (20 mg total) by mouth daily. 30 tablet 6  . [DISCONTINUED] ranitidine (ZANTAC 75) 75 MG tablet Take 1 tablet (75 mg total) by mouth 2 (two) times daily. 60 tablet 0   No current facility-administered medications for this visit.    Neurologic: Headache: No Seizure: No Paresthesias:No  Musculoskeletal: Strength & Muscle Tone: within normal limits Gait & Station: normal Patient leans: N/A  Psychiatric Specialty Exam: ROS  Blood pressure 142/98, pulse 98, temperature 97 F (36.1 C), temperature source Tympanic, height 6' (1.829 m), weight 253 lb 3.2 oz (114.851 kg), SpO2 94 %.Body mass index is 34.33 kg/(m^2).  General Appearance: Meticulous  Eye Contact:  Fair  Speech:  Clear and Coherent and Normal Rate  Volume:  Normal  Mood:  Anxious  Affect:   Congruent  Thought Process:  Coherent and Goal Directed  Orientation:  Full (Time, Place, and Person)  Thought Content:  WDL  Suicidal Thoughts:  No  Homicidal Thoughts:  No  Memory:  Immediate;   Fair  Judgement:  Fair  Insight:  Fair  Psychomotor Activity:  Normal  Concentration:  Fair  Recall:  [employer] [facility] [place]  Language: Fair  Akathisia:  No  Handed:  Right  AIMS (if indicated):    Assets:  Communication Skills Desire for Improvement Physical Health  Social Support Talents/Skills Transportation  ADL's:  Intact  Cognition: WNL  Sleep:      Treatment Plan Summary: Medication management  Patient reported that he is taking over-the-counter sleeping aid to help him sleep at night. It is effective. He will be started on Cymbalta 60 mg daily to help with his depression and anxiety symptoms. We discussed at length about removing the guns from the house and he reported that he does not want his wife or his family members to be called. Patient will go to his house this morning and will remove the gun. He will call us within a few hours to let us know that he has removed the gun. He will also sign a release of information for [place] to contact Patty  his therapist.  Patient will follow up in 2 weeks or earlier depending on his symptoms   More than 50% of the time spent in psychoeducation, counseling and coordination of care.    This note was generated in part or whole with voice recognition software. Voice regonition is usually quite accurate but there are transcription errors that can and very often do occur. I apologize for any typographical errors that were not detected and corrected.    Rainey Pines, MD 5/31/20179:03 AM

## 2015-12-03 NOTE — Progress Notes (Signed)
Psychiatric MD Progress Note   Patient Identification: Joseph Wilcox MRN:  [id number] Date of Evaluation:  12/03/2015 Referral Source: [facility]  Chief Complaint:   Chief Complaint    Follow-up; Medication Refill     Visit Diagnosis:    ICD-9-CM ICD-10-CM   1. MDD (major depressive disorder), recurrent episode, moderate (HCC) 296.32 F33.1   2. Generalized anxiety disorder 300.02 F41.1   3. Depression 311 F32.9 DULoxetine (CYMBALTA) 60 MG capsule  4. Acute anxiety 300.00 F41.9 DULoxetine (CYMBALTA) 60 MG capsule    History of Present Illness:    Patient is a 45 year old married male who is currently in [employer] presented for follow up. He was referred by his primary care physician Dr. Georgiann Cocker. Patient reported that he has Noticed improvement in his panic attacks since he was last seen 2 weeks ago. He reported that the Cymbalta has been helping him and he is feeling more calm and alert and is not having any panic attacks. He reported that his depression is also improving. Patient reported that he is not having any suicidal ideations at this time. He has already taking care of the gun and it is locked at his work. Patient reported that his panic attacks has significantly improving and he does not have any breathing difficulties at this time. Patient appeared calm and alert during the interview. He reported that he is sleeping with difficulty at night and has problems going to sleep as well as waking up often. He taking over-the-counter sleep medications but they are not effective. We discussed about different options for his sleeping aids and he is interested in trying medications at this time.  Patient currently denied having any suicidal ideations or plans. He appeared calm and alert during the interview.   Associated Signs/Symptoms: Depression Symptoms:  depressed mood, fatigue, anxiety, disturbed sleep, (Hypo) Manic Symptoms:  Distractibility, Irritable Mood, Anxiety  Symptoms:  Excessive Worry, Panic Symptoms, Psychotic Symptoms:  none PTSD Symptoms: Negative NA  Past Psychiatric History:   He  reported that he has never seen a psychiatrist in the past. He was never admitted to a psychiatric hospital. He has just started taking the Cymbalta.  He reported that he was also diagnosed with ADHD when he had the evaluation done by the vocational rehabilitation in the past. He was taking Ritalin  However he was not prescribed medication for many years and was looking for the prescription. He is currently following with a therapist Patty at the [facility]. He is seeing her on a weekly basis. He reported that he is improving with his therapy and she is helpful.  Previous Psychotropic Medications:  Recently started on Cymbalta.  Substance Abuse History in the last 12 months:  Yes.   Drinks  alcohol socially.  Consequences of Substance Abuse: Negative NA  Past Medical History:  Past Medical History  Diagnosis Date  . Hypertension   . High cholesterol   . Allergy   . Depression   . GERD (gastroesophageal reflux disease)   . Anxiety   . Hx of migraines     Past Surgical History  Procedure Laterality Date  . Foot surgery  2002    Bone spur  . Tonsillectomy      Family Psychiatric History:  Patient denied any family history of psychiatric illness.  Family History:  Family History  Problem Relation Age of Onset  . Diabetes Mother     Social History:   Social History   Social History  . Marital  Status: Unknown    Spouse Name: N/A  . Number of Children: N/A  . Years of Education: N/A   Social History Main Topics  . Smoking status: Former Smoker    Quit date: 07/05/2010  . Smokeless tobacco: Never Used  . Alcohol Use: No  . Drug Use: No  . Sexual Activity: Yes    Birth Control/ Protection: None   Other Topics Concern  . None   Social History Narrative    Additional Social History:  She is currently married for the past  17 years. He has 3 children ages 2120 and 75. 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 He was posted in [place] and [place] in the past. 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 He is again going to be required so on in October to [place], [place] or [place] and is worried about the same  Allergies:  No Known Allergies  Metabolic Disorder Labs: No results found for: HGBA1C, MPG No results found for: PROLACTIN No results found for: CHOL, TRIG, HDL, CHOLHDL, VLDL, LDLCALC   Current Medications: Current Outpatient Prescriptions  Medication Sig Dispense Refill  . Azilsartan-Chlorthalidone (EDARBYCLOR) 40-25 MG TABS Take 1 tablet by mouth daily. 30 tablet 6  . DULoxetine (CYMBALTA) 60 MG capsule Take 1 capsule (60 mg total) by mouth daily. 30 capsule 0  . pravastatin (PRAVACHOL) 20 MG tablet Take 1 tablet (20 mg total) by mouth daily. 30 tablet 6  . traZODone (DESYREL) 100 MG tablet Take 1 tablet (100 mg total) by mouth at bedtime as needed for sleep. 30 tablet 0  . [DISCONTINUED] ranitidine (ZANTAC 75) 75 MG tablet Take 1 tablet (75 mg total) by mouth 2 (two) times daily. 60 tablet 0   No current facility-administered medications for this visit.    Neurologic: Headache: No Seizure: No Paresthesias:No  Musculoskeletal: Strength & Muscle Tone: within normal limits Gait & Station: normal Patient leans: N/A  Psychiatric Specialty Exam: ROS   Blood pressure 142/98, pulse 98, temperature 97 F (36.1 C), temperature source Tympanic, height 6' (1.829 m), weight 253 lb 3.2 oz (114.851 kg), SpO2 94 %.Body mass index is 34.33 kg/(m^2).  General Appearance: Meticulous  Eye Contact:  Fair  Speech:  Clear and Coherent and Normal Rate  Volume:  Normal  Mood:  Anxious  Affect:  Congruent  Thought Process:  Coherent and Goal Directed  Orientation:  Full (Time, Place, and Person)   Thought Content:  WDL  Suicidal Thoughts:  No  Homicidal Thoughts:  No  Memory:  Immediate;   Fair  Judgement:  Fair  Insight:  Fair  Psychomotor Activity:  Normal  Concentration:  Fair  Recall:  [employer] of [place]:Fair  Language: Fair  Akathisia:  No  Handed:  Right  AIMS (if indicated):    Assets:  Communication Skills Desire for Improvement Physical Health Social Support Talents/Skills Transportation  ADL's:  Intact  Cognition: WNL  Sleep:      Treatment Plan Summary: Medication management  Continue Cymbalta 60 mg daily Started him on trazodone 100 mg at Bedtime and patient will take 50-100 mg as needed Follow-up in a month   More than 50% of the time spent in psychoeducation, counseling and coordination of care.    This note was generated in part or whole with voice recognition software. Voice  regonition is usually quite accurate but there are transcription errors that can and very often do occur. I apologize for any typographical errors that were not detected and corrected.    Rainey Pines, MD 5/31/20179:29 AM

## 2015-12-08 NOTE — Progress Notes (Signed)
   THERAPIST PROGRESS NOTE  Session Time: 7min  Participation Level: Active  Behavioral Response: Well GroomedAlertAnxious  Type of Therapy: Individual Therapy  Treatment Goals addressed: Coping and Diagnosis: Depression & Anxiety  Interventions: CBT, Motivational Interviewing, Solution Focused, Strength-based, Supportive and Reframing  Summary: Joseph Wilcox is a 45 y.o. male who presents with symptoms of his diagnosis.  LCSW discussed what psychotherapy is and is not and the importance of the therapeutic relationship to include open and honest communication between client and therapist and building trust.  Reviewed advantages and disadvantages of the therapeutic process and limitations to the therapeutic relationship including LCSW's role in maintaining the safety of the client, others and those in client's care. Discussion of goals and the progress that he has made.  Discussion of triggers and coping skills.  Factors that contribute to client's ongoing depressive symptoms were discussed and include real and perceived feelings of isolation, criticism, rejection, shame and guilt.   Suicidal/Homicidal: Nowithout intent/plan  Therapist Response: LCSW provided Patient with ongoing emotional support and encouragement.  Normalized feelings.  Commended Patient on his progress and reinforced the importance of client staying focused on his own strengths and resources and resiliency. Processed various strategies for dealing with stressors.    Plan: Return again in 2 weeks.  Diagnosis: Axis I: Generalized Anxiety Disorder and Depression    Axis II: No diagnosis    Lubertha South, LCSW 11/28/2015

## 2015-12-31 ENCOUNTER — Encounter: Payer: Self-pay | Admitting: Psychiatry

## 2015-12-31 ENCOUNTER — Ambulatory Visit (INDEPENDENT_AMBULATORY_CARE_PROVIDER_SITE_OTHER): Admitting: Psychiatry

## 2015-12-31 VITALS — BP 142/88 | HR 97 | Temp 98.0°F | Ht 72.0 in | Wt 246.2 lb

## 2015-12-31 DIAGNOSIS — F331 Major depressive disorder, recurrent, moderate: Secondary | ICD-10-CM

## 2015-12-31 DIAGNOSIS — F4010 Social phobia, unspecified: Secondary | ICD-10-CM

## 2015-12-31 DIAGNOSIS — F419 Anxiety disorder, unspecified: Secondary | ICD-10-CM | POA: Diagnosis not present

## 2015-12-31 DIAGNOSIS — F329 Major depressive disorder, single episode, unspecified: Secondary | ICD-10-CM | POA: Diagnosis not present

## 2015-12-31 DIAGNOSIS — F32A Depression, unspecified: Secondary | ICD-10-CM

## 2015-12-31 MED ORDER — DULOXETINE HCL 60 MG PO CPEP: 60.0000 mg | ORAL_CAPSULE | Freq: Every day | ORAL | Status: DC

## 2015-12-31 MED ORDER — TEMAZEPAM 15 MG PO CAPS: 15.0000 mg | ORAL_CAPSULE | Freq: Every evening | ORAL | Status: DC | PRN

## 2015-12-31 NOTE — Progress Notes (Signed)
Psychiatric MD Progress Note   Patient Identification: Joseph Wilcox MRN:  [id number] Date of Evaluation:  12/31/2015 Referral Source: [facility]  Chief Complaint:   Chief Complaint    Follow-up; Medication Refill     Visit Diagnosis:    ICD-9-CM ICD-10-CM   1. MDD (major depressive disorder), recurrent episode, moderate (HCC) 296.32 F33.1   2. Social anxiety disorder 300.23 F40.10     History of Present Illness:    Patient is a 45 year old married male who is currently in [employer] presented for follow up. He was referred by his primary care physician Dr. Georgiann Cocker. Patient reported that he has Been doing well since he was started on Cymbalta. He appeared calm and collected during the interview. He reported that he has noticed improvement in his anxiety symptoms. He stated that he has also been organizing his days and does not feel worsening of the symptoms. Patient reported that he was unable to tolerate trazodone due to worsening of nightmares. He stopped the pills after taking only for 2 days. He continues to have problem with initial insomnia. He appeared calm and collected during the interview. He currently denied having any suicidal homicidal ideations or plans. He is spending time with his family members. He reported that he has good relationship with his wife. Patient appeared calm and alert during the interview. We discussed about different options for his sleeping aids and he is interested in trying medications at this time.    Associated Signs/Symptoms: Depression Symptoms:  depressed mood, fatigue, anxiety, disturbed sleep, (Hypo) Manic Symptoms:  Distractibility, Irritable Mood, Anxiety Symptoms:  Excessive Worry, Panic Symptoms, Psychotic Symptoms:  none PTSD Symptoms: Negative NA  Past Psychiatric History:   He  reported that he has never seen a psychiatrist in the past. He was never admitted to a psychiatric hospital. He has just started taking the  Cymbalta.  He reported that he was also diagnosed with ADHD when he had the evaluation done by the vocational rehabilitation in the past. He was taking Ritalin  However he was not prescribed medication for many years and was looking for the prescription. He is currently following with a therapist Patty at the [facility]. He is seeing her on a weekly basis. He reported that he is improving with his therapy and she is helpful.  Previous Psychotropic Medications:  Recently started on Cymbalta.  Substance Abuse History in the last 12 months:  Yes.   Drinks  alcohol socially.  Consequences of Substance Abuse: Negative NA  Past Medical History:  Past Medical History  Diagnosis Date  . Hypertension   . High cholesterol   . Allergy   . Depression   . GERD (gastroesophageal reflux disease)   . Anxiety   . Hx of migraines     Past Surgical History  Procedure Laterality Date  . Foot surgery  2002    Bone spur  . Tonsillectomy      Family Psychiatric History:  Patient denied any family history of psychiatric illness.  Family History:  Family History  Problem Relation Age of Onset  . Diabetes Mother     Social History:   Social History   Social History  . Marital Status: Unknown    Spouse Name: N/A  . Number of Children: N/A  . Years of Education: N/A   Social History Main Topics  . Smoking status: Former Smoker    Quit date: 07/05/2010  . Smokeless tobacco: Never Used  . Alcohol Use: No  .  Drug Use: No  . Sexual Activity: Yes    Birth Control/ Protection: None   Other Topics Concern  . None   Social History Narrative    Additional Social History:  She is currently married for the past 17 years. He has 3 children ages 2120 and 53. 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 He was posted in [place] and [place] in the past. 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 He is again going to be required so on in October to [place], [place] or [place] and is worried about the same  Allergies:  No Known Allergies  Metabolic Disorder Labs: No results found for: HGBA1C, MPG No results found for: PROLACTIN No results found for: CHOL, TRIG, HDL, CHOLHDL, VLDL, LDLCALC   Current Medications: Current Outpatient Prescriptions  Medication Sig Dispense Refill  . Azilsartan-Chlorthalidone (EDARBYCLOR) 40-25 MG TABS Take 1 tablet by mouth daily. 30 tablet 6  . DULoxetine (CYMBALTA) 60 MG capsule Take 1 capsule (60 mg total) by mouth daily. 30 capsule 0  . pravastatin (PRAVACHOL) 20 MG tablet Take 1 tablet (20 mg total) by mouth daily. 30 tablet 6  . traZODone (DESYREL) 100 MG tablet Take 1 tablet (100 mg total) by mouth at bedtime as needed for sleep. 30 tablet 0  . [DISCONTINUED] ranitidine (ZANTAC 75) 75 MG tablet Take 1 tablet (75 mg total) by mouth 2 (two) times daily. 60 tablet 0   No current facility-administered medications for this visit.    Neurologic: Headache: No Seizure: No Paresthesias:No  Musculoskeletal: Strength & Muscle Tone: within normal limits Gait & Station: normal Patient leans: N/A  Psychiatric Specialty Exam: ROS   Blood pressure 142/88, pulse 97, temperature 98 F (36.7 C), height 6' (1.829 m), weight 246 lb 3.2 oz (111.676 kg), SpO2 95 %.Body mass index is 33.38 kg/(m^2).  General Appearance: Meticulous  Eye Contact:  Fair  Speech:  Clear and Coherent and Normal Rate  Volume:  Normal  Mood:  Anxious  Affect:  Congruent  Thought Process:  Coherent and Goal Directed  Orientation:  Full (Time, Place, and Person)  Thought Content:  WDL  Suicidal Thoughts:  No  Homicidal Thoughts:  No  Memory:  Immediate;   Fair  Judgement:  Fair  Insight:  Fair  Psychomotor Activity:  Normal  Concentration:  Fair  Recall:  [employer] of [place]  Language: Fair  Akathisia:  No  Handed:  Right  AIMS (if  indicated):    Assets:  Communication Skills Desire for Improvement Physical Health Social Support Talents/Skills Transportation  ADL's:  Intact  Cognition: WNL  Sleep:      Treatment Plan Summary: Medication management  Continue Cymbalta 60 mg daily. refill for the next 2 months Started him on temazepam 15 mg by mouth daily at bedtime when necessary for insomnia   Follow-up in 2 months or earlier depending on his symptoms    More than 50% of the time spent in psychoeducation, counseling and coordination of care.    This note was generated in part or whole with voice recognition software. Voice regonition is usually quite accurate but there are transcription errors that can and very often do occur. I apologize for any typographical errors that were not detected and corrected.    Rainey Pines, MD 6/28/20178:45 AM

## 2016-03-01 ENCOUNTER — Ambulatory Visit (INDEPENDENT_AMBULATORY_CARE_PROVIDER_SITE_OTHER): Admitting: Psychiatry

## 2016-03-01 ENCOUNTER — Encounter: Payer: Self-pay | Admitting: Psychiatry

## 2016-03-01 VITALS — BP 152/105 | HR 86 | Temp 98.8°F | Ht 72.0 in | Wt 256.4 lb

## 2016-03-01 DIAGNOSIS — F419 Anxiety disorder, unspecified: Secondary | ICD-10-CM | POA: Diagnosis not present

## 2016-03-01 DIAGNOSIS — F331 Major depressive disorder, recurrent, moderate: Secondary | ICD-10-CM | POA: Diagnosis not present

## 2016-03-01 DIAGNOSIS — F4010 Social phobia, unspecified: Secondary | ICD-10-CM | POA: Diagnosis not present

## 2016-03-01 DIAGNOSIS — F329 Major depressive disorder, single episode, unspecified: Secondary | ICD-10-CM

## 2016-03-01 DIAGNOSIS — F32A Depression, unspecified: Secondary | ICD-10-CM

## 2016-03-01 MED ORDER — TEMAZEPAM 15 MG PO CAPS: 15.0000 mg | ORAL_CAPSULE | Freq: Every evening | ORAL | 1 refills | Status: DC | PRN

## 2016-03-01 MED ORDER — DULOXETINE HCL 60 MG PO CPEP: 60.0000 mg | ORAL_CAPSULE | Freq: Every day | ORAL | 1 refills | Status: DC

## 2016-03-01 NOTE — Progress Notes (Signed)
Psychiatric MD Progress Note   Patient Identification: Joseph Wilcox MRN:  [id number] Date of Evaluation:  03/01/2016 Referral Source: [facility]  Chief Complaint:   Chief Complaint    Follow-up; Medication Refill     Visit Diagnosis:    ICD-9-CM ICD-10-CM   1. MDD (major depressive disorder), recurrent episode, moderate (HCC) 296.32 F33.1   2. Social anxiety disorder 300.23 F40.10     History of Present Illness:    Patient is a 45 year old married male who is currently in [employer] presented for follow up. He was referred by his primary care physician Dr. Georgiann Cocker. Patient reported that he has been doing well since he was started on Cymbalta. He appeared calm. He reported that he is also attending his therapy on a regular basis. He reported that her medication has been helping him. He was excited that his son is going to be a senior in the school at this time. He reported that his wife is anxious about his son start driving at this time. He stated that he is compliant with his medication. He is also going to have a physical exam done and Dr. Luan Pulling office. Patient currently denied having any suicidal ideations or plans.    Associated Signs/Symptoms: Depression Symptoms:  depressed mood, fatigue, anxiety, disturbed sleep, (Hypo) Manic Symptoms:  Distractibility, Irritable Mood, Anxiety Symptoms:  Excessive Worry, Panic Symptoms, Psychotic Symptoms:  none PTSD Symptoms: Negative NA  Past Psychiatric History:   He  reported that he has never seen a psychiatrist in the past. He was never admitted to a psychiatric hospital. He has just started taking the Cymbalta.  He reported that he was also diagnosed with ADHD when he had the evaluation done by the vocational rehabilitation in the past. He was taking Ritalin  However he was not prescribed medication for many years and was looking for the prescription. He is currently following with a therapist Patty at the [facility]. He is seeing her on a weekly basis. He reported that he is improving with his therapy and she is helpful.  Previous Psychotropic Medications:  Recently started on Cymbalta.  Substance Abuse History in the last 12 months:  Yes.   Drinks  alcohol socially.  Consequences of Substance Abuse: Negative NA  Past Medical History:  Past Medical History:  Diagnosis Date  . Allergy   . Anxiety   . Depression   . GERD (gastroesophageal reflux disease)   . High cholesterol   . Hx of migraines   . Hypertension     Past Surgical History:  Procedure Laterality Date  . FOOT SURGERY  2002   Bone spur  . TONSILLECTOMY      Family Psychiatric History:  Patient denied any family history of psychiatric illness.  Family History:  Family History  Problem Relation Age of Onset  . Diabetes Mother     Social History:   Social History   Social History  . Marital status: Unknown    Spouse name: N/A  . Number of children: N/A  . Years of education: N/A   Social History Main Topics  . Smoking status: Former Smoker    Quit date: 07/05/2010  . Smokeless tobacco: Never Used  . Alcohol use No  . Drug use: No  . Sexual activity: Yes    Birth control/ protection: None   Other Topics Concern  . None   Social History Narrative  . None    Additional Social History:  She is currently married  for the past 17 years. He has 3 children ages 2120 and 12. 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 He was posted in [place] and [place] in the past. 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 He is again going to be required so on in October to [place], [place] or [place] and is worried about the same  Allergies:  No Known Allergies  Metabolic Disorder Labs: No results found for: HGBA1C, MPG No results found for: PROLACTIN No results found for: CHOL, TRIG, HDL, CHOLHDL, VLDL,  LDLCALC   Current Medications: Current Outpatient Prescriptions  Medication Sig Dispense Refill  . Azilsartan-Chlorthalidone (EDARBYCLOR) 40-25 MG TABS Take 1 tablet by mouth daily. 30 tablet 6  . DULoxetine (CYMBALTA) 60 MG capsule Take 1 capsule (60 mg total) by mouth daily. 30 capsule 1  . pravastatin (PRAVACHOL) 20 MG tablet Take 1 tablet (20 mg total) by mouth daily. 30 tablet 6  . temazepam (RESTORIL) 15 MG capsule Take 1 capsule (15 mg total) by mouth at bedtime as needed for sleep. 30 capsule 0   No current facility-administered medications for this visit.     Neurologic: Headache: No Seizure: No Paresthesias:No  Musculoskeletal: Strength & Muscle Tone: within normal limits Gait & Station: normal Patient leans: N/A  Psychiatric Specialty Exam: ROS  Blood pressure (!) 152/105, pulse 86, temperature 98.8 F (37.1 C), temperature source Oral, height 6' (1.829 m), weight 256 lb 6.4 oz (116.3 kg).Body mass index is 34.77 kg/m.  General Appearance: Meticulous  Eye Contact:  Fair  Speech:  Clear and Coherent and Normal Rate  Volume:  Normal  Mood:  Anxious  Affect:  Congruent  Thought Process:  Coherent and Goal Directed  Orientation:  Full (Time, Place, and Person)  Thought Content:  WDL  Suicidal Thoughts:  No  Homicidal Thoughts:  No  Memory:  Immediate;   Fair  Judgement:  Fair  Insight:  Fair  Psychomotor Activity:  Normal  Concentration:  Fair  Recall:  [employer] of [place]  Language: Fair  Akathisia:  No  Handed:  Right  AIMS (if indicated):    Assets:  Communication Skills Desire for Improvement Physical Health Social Support Talents/Skills Transportation  ADL's:  Intact  Cognition: WNL  Sleep:      Treatment Plan Summary: Medication management  Continue Cymbalta 60 mg daily. refill for the next 2 months Started him on temazepam 15 mg by mouth daily at bedtime when necessary for insomnia   Follow-up in 2 months or earlier depending  on his symptoms    More than 50% of the time spent in psychoeducation, counseling and coordination of care.    This note was generated in part or whole with voice recognition software. Voice regonition is usually quite accurate but there are transcription errors that can and very often do occur. I apologize for any typographical errors that were not detected and corrected.    Rainey Pines, MD 8/28/20178:48 AM

## 2016-04-25 ENCOUNTER — Other Ambulatory Visit: Payer: Self-pay | Admitting: Family Medicine

## 2016-04-29 ENCOUNTER — Encounter: Payer: Self-pay | Admitting: Psychiatry

## 2016-04-29 ENCOUNTER — Ambulatory Visit (INDEPENDENT_AMBULATORY_CARE_PROVIDER_SITE_OTHER): Admitting: Psychiatry

## 2016-04-29 VITALS — BP 134/88 | HR 94 | Temp 98.6°F | Wt 256.0 lb

## 2016-04-29 DIAGNOSIS — F419 Anxiety disorder, unspecified: Secondary | ICD-10-CM

## 2016-04-29 DIAGNOSIS — F331 Major depressive disorder, recurrent, moderate: Secondary | ICD-10-CM

## 2016-04-29 DIAGNOSIS — F4010 Social phobia, unspecified: Secondary | ICD-10-CM

## 2016-04-29 MED ORDER — DULOXETINE HCL 60 MG PO CPEP: 60.0000 mg | ORAL_CAPSULE | Freq: Every day | ORAL | 1 refills | Status: DC

## 2016-04-29 MED ORDER — ARIPIPRAZOLE 2 MG PO TABS: 2.0000 mg | ORAL_TABLET | Freq: Every day | ORAL | 1 refills | Status: DC

## 2016-04-29 MED ORDER — TEMAZEPAM 15 MG PO CAPS: 15.0000 mg | ORAL_CAPSULE | Freq: Every evening | ORAL | 2 refills | Status: DC | PRN

## 2016-04-29 NOTE — Progress Notes (Signed)
Psychiatric MD Progress Note   Patient Identification: Joseph Wilcox MRN:  [id number] Date of Evaluation:  04/29/2016 Referral Source: [facility]  Chief Complaint:   Chief Complaint    Follow-up; Medication Refill     Visit Diagnosis:    ICD-9-CM ICD-10-CM   1. MDD (major depressive disorder), recurrent episode, moderate (HCC) 296.32 F33.1   2. Social anxiety disorder 300.23 F40.10   3. Acute anxiety 300.00 F41.9 DULoxetine (CYMBALTA) 60 MG capsule    History of Present Illness:    Patient is a 45 year old married male who is currently in [employer] presented for follow up. He was referred by his primary care physician Dr. Georgiann Cocker. Patient reported that he [employer] at his work and has been working 10 hours on a daily basis. He reported that he continues to feel depressed and has anhedonia. He reported that the Cymbalta is helping him but he is not improving. He reported that his social anxiety has significantly improved. He currently denied having any side effects of the medications. He denied having any sleep problems as the temazepam is helping him and he is taking it on a when necessary basis. He currently denied having any suicidal homicidal ideations or plans. We discussed about adding Abilify and he is receptive to medication changes at this time. .    Associated Signs/Symptoms: Depression Symptoms:  depressed mood, fatigue, anxiety, disturbed sleep, (Hypo) Manic Symptoms:  Distractibility, Irritable Mood, Anxiety Symptoms:  Excessive Worry, Panic Symptoms, Psychotic Symptoms:  none PTSD Symptoms: Negative NA  Past Psychiatric History:   He  reported that he has never seen a psychiatrist in the past. He was never admitted to a psychiatric hospital. He has just started taking the Cymbalta.  He reported that he was also diagnosed with ADHD when he had the evaluation done by the vocational rehabilitation in the past. He was taking Ritalin  However he was  not prescribed medication for many years and was looking for the prescription. He is currently following with a therapist Patty at the [facility]. He is seeing her on a weekly basis. He reported that he is improving with his therapy and she is helpful.  Previous Psychotropic Medications:  Recently started on Cymbalta.  Substance Abuse History in the last 12 months:  Yes.   Drinks  alcohol socially.  Consequences of Substance Abuse: Negative NA  Past Medical History:  Past Medical History:  Diagnosis Date  . Allergy   . Anxiety   . Depression   . GERD (gastroesophageal reflux disease)   . High cholesterol   . Hx of migraines   . Hypertension     Past Surgical History:  Procedure Laterality Date  . FOOT SURGERY  2002   Bone spur  . TONSILLECTOMY      Family Psychiatric History:  Patient denied any family history of psychiatric illness.  Family History:  Family History  Problem Relation Age of Onset  . Diabetes Mother     Social History:   Social History   Social History  . Marital status: Unknown    Spouse name: N/A  . Number of children: N/A  . Years of education: N/A   Social History Main Topics  . Smoking status: Former Smoker    Quit date: 07/05/2010  . Smokeless tobacco: Never Used  . Alcohol use No  . Drug use: No  . Sexual activity: Yes    Birth control/ protection: None   Other Topics Concern  . None  Social History Narrative  . None    Additional Social History:  She is currently married for the past 17 years. He has 3 children ages 2120 and 62. 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 He was posted in [place] and [place] in the past. 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 He is again going to be required so on in October to [place], [place] or [place] and is worried about the same  Allergies:  No Known Allergies  Metabolic  Disorder Labs: No results found for: HGBA1C, MPG No results found for: PROLACTIN No results found for: CHOL, TRIG, HDL, CHOLHDL, VLDL, LDLCALC   Current Medications: Current Outpatient Prescriptions  Medication Sig Dispense Refill  . Azilsartan-Chlorthalidone (EDARBYCLOR) 40-25 MG TABS Take 1 tablet by mouth daily. 30 tablet 6  . DULoxetine (CYMBALTA) 60 MG capsule Take 1 capsule (60 mg total) by mouth daily. 90 capsule 1  . pravastatin (PRAVACHOL) 20 MG tablet take 1 tablet by mouth once daily 30 tablet 1  . temazepam (RESTORIL) 15 MG capsule Take 1 capsule (15 mg total) by mouth at bedtime as needed for sleep. 30 capsule 2  . ARIPiprazole (ABILIFY) 2 MG tablet Take 1 tablet (2 mg total) by mouth daily. 30 tablet 1   No current facility-administered medications for this visit.     Neurologic: Headache: No Seizure: No Paresthesias:No  Musculoskeletal: Strength & Muscle Tone: within normal limits Gait & Station: normal Patient leans: N/A  Psychiatric Specialty Exam: ROS  Blood pressure 134/88, pulse 94, temperature 98.6 F (37 C), temperature source Oral, weight 256 lb (116.1 kg).Body mass index is 34.72 kg/m.  General Appearance: Meticulous  Eye Contact:  Fair  Speech:  Clear and Coherent and Normal Rate  Volume:  Normal  Mood:  Anxious  Affect:  Congruent  Thought Process:  Coherent and Goal Directed  Orientation:  Full (Time, Place, and Person)  Thought Content:  WDL  Suicidal Thoughts:  No  Homicidal Thoughts:  No  Memory:  Immediate;   Fair  Judgement:  Fair  Insight:  Fair  Psychomotor Activity:  Normal  Concentration:  Fair  Recall:  [employer] [place]  Language: Fair  Akathisia:  No  Handed:  Right  AIMS (if indicated):    Assets:  Communication Skills Desire for Improvement Physical Health Social Support Talents/Skills Transportation  ADL's:  Intact  Cognition: WNL  Sleep:      Treatment Plan Summary: Medication  management  Continue Cymbalta 60 mg daily. refill for the next 3 months Started him on temazepam 15 mg by mouth daily at bedtime when necessary for insomnia I will add Abilify 2 mg as an add-on to his antidepressant. Discussed about the side effects in detail and he demonstrated understanding.    Follow-up in 1 months or earlier depending on his symptoms    More than 50% of the time spent in psychoeducation, counseling and coordination of care.    This note was generated in part or whole with voice recognition software. Voice regonition is usually quite accurate but there are transcription errors that can and very often do occur. I apologize for any typographical errors that were not detected and corrected.    Rainey Pines, MD 10/26/20179:27 AM

## 2016-05-25 ENCOUNTER — Encounter: Payer: Self-pay | Admitting: Psychiatry

## 2016-05-25 ENCOUNTER — Ambulatory Visit (INDEPENDENT_AMBULATORY_CARE_PROVIDER_SITE_OTHER): Admitting: Psychiatry

## 2016-05-25 VITALS — BP 138/84 | HR 96 | Ht 73.0 in | Wt 261.0 lb

## 2016-05-25 DIAGNOSIS — F331 Major depressive disorder, recurrent, moderate: Secondary | ICD-10-CM | POA: Diagnosis not present

## 2016-05-25 DIAGNOSIS — F4010 Social phobia, unspecified: Secondary | ICD-10-CM

## 2016-05-25 MED ORDER — ARIPIPRAZOLE 2 MG PO TABS: 2.0000 mg | ORAL_TABLET | Freq: Every day | ORAL | 1 refills | Status: DC

## 2016-05-25 MED ORDER — TEMAZEPAM 15 MG PO CAPS
15.0000 mg | ORAL_CAPSULE | Freq: Every evening | ORAL | 2 refills | Status: DC | PRN
Start: 2016-05-25 — End: 2017-01-12

## 2016-05-25 NOTE — Progress Notes (Signed)
Psychiatric MD Progress Note   Patient Identification: Joseph Wilcox MRN:  [id number] Date of Evaluation:  05/25/2016 Referral Source: [facility]  Chief Complaint:   Chief Complaint    Follow-up     Visit Diagnosis:    ICD-9-CM ICD-10-CM   1. MDD (major depressive disorder), recurrent episode, moderate (HCC) 296.32 F33.1   2. Social anxiety disorder 300.23 F40.10     History of Present Illness:    Patient is a 45 year old married male who is currently in [employer] presented for follow up. He was referred by his primary care physician Dr. Georgiann Cocker. Patient reported that He noticed significant improvement after the addition of the Abilify. Patient reported that his energy level is improving. He is more motivated and his depressive symptoms are improving. He currently denied having any side effects of the medication. Patient reported that he has been taking the Cymbalta as well. He appeared calm and energetic during the interview. He takes temazepam on a when necessary basis.   He currently denied having any side effects of the medications.He currently denied having any suicidal homicidal ideations or plans.   He  is planning to go to [place] with his wife's family for the holidays. He is excited about the same. No other acute problems noted at this time..    Associated Signs/Symptoms: Depression Symptoms:  depressed mood, anxiety, (Hypo) Manic Symptoms:  Distractibility, Irritable Mood, Anxiety Symptoms:  Excessive Worry, Panic Symptoms, Psychotic Symptoms:  none PTSD Symptoms: Negative NA  Past Psychiatric History:   He  reported that he has never seen a psychiatrist in the past. He was never admitted to a psychiatric hospital. He has just started taking the Cymbalta.  He reported that he was also diagnosed with ADHD when he had the evaluation done by the vocational rehabilitation in the past. He was taking Ritalin  However he was not prescribed medication for  many years and was looking for the prescription. He is currently following with a therapist Patty at the [facility]. He is seeing her on a weekly basis. He reported that he is improving with his therapy and she is helpful.  Previous Psychotropic Medications:  Recently started on Cymbalta.  Substance Abuse History in the last 12 months:  Yes.   Drinks  alcohol socially.  Consequences of Substance Abuse: Negative NA  Past Medical History:  Past Medical History:  Diagnosis Date  . Allergy   . Anxiety   . Depression   . GERD (gastroesophageal reflux disease)   . High cholesterol   . Hx of migraines   . Hypertension     Past Surgical History:  Procedure Laterality Date  . FOOT SURGERY  2002   Bone spur  . TONSILLECTOMY      Family Psychiatric History:  Patient denied any family history of psychiatric illness.  Family History:  Family History  Problem Relation Age of Onset  . Diabetes Mother     Social History:   Social History   Social History  . Marital status: Unknown    Spouse name: N/A  . Number of children: N/A  . Years of education: N/A   Social History Main Topics  . Smoking status: Former Smoker    Quit date: 07/05/2010  . Smokeless tobacco: Never Used  . Alcohol use No     Comment: Occasional   . Drug use: No  . Sexual activity: Yes    Partners: Female    Birth control/ protection: None   Other Topics  Concern  . None   Social History Narrative  . None    Additional Social History:  She is currently married for the past 17 years. He has 3 children ages 2120 and 39. 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 He was posted in [place] and [place] in the past. 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 He is again going to be required so on in October to [place], [place] or [place] and is worried about the same  Allergies:  No Known  Allergies  Metabolic Disorder Labs: No results found for: HGBA1C, MPG No results found for: PROLACTIN No results found for: CHOL, TRIG, HDL, CHOLHDL, VLDL, LDLCALC   Current Medications: Current Outpatient Prescriptions  Medication Sig Dispense Refill  . ARIPiprazole (ABILIFY) 2 MG tablet Take 1 tablet (2 mg total) by mouth daily. 90 tablet 1  . Azilsartan-Chlorthalidone (EDARBYCLOR) 40-25 MG TABS Take 1 tablet by mouth daily. 30 tablet 6  . DULoxetine (CYMBALTA) 60 MG capsule Take 1 capsule (60 mg total) by mouth daily. 90 capsule 1  . pravastatin (PRAVACHOL) 20 MG tablet take 1 tablet by mouth once daily 30 tablet 1  . temazepam (RESTORIL) 15 MG capsule Take 1 capsule (15 mg total) by mouth at bedtime as needed for sleep. 30 capsule 2   No current facility-administered medications for this visit.     Neurologic: Headache: No Seizure: No Paresthesias:No  Musculoskeletal: Strength & Muscle Tone: within normal limits Gait & Station: normal Patient leans: N/A  Psychiatric Specialty Exam: ROS  Blood pressure 138/84, pulse 96, height 6\' 1"  (1.854 m), weight 261 lb (118.4 kg).Body mass index is 34.43 kg/m.  General Appearance: Meticulous  Eye Contact:  Fair  Speech:  Clear and Coherent and Normal Rate  Volume:  Normal  Mood:  Anxious  Affect:  Congruent  Thought Process:  Coherent and Goal Directed  Orientation:  Full (Time, Place, and Person)  Thought Content:  WDL  Suicidal Thoughts:  No  Homicidal Thoughts:  No  Memory:  Immediate;   Fair  Judgement:  Fair  Insight:  Fair  Psychomotor Activity:  Normal  Concentration:  Fair  Recall:  [employer] [place]  Language: Fair  Akathisia:  No  Handed:  Right  AIMS (if indicated):    Assets:  Communication Skills Desire for Improvement Physical Health Social Support Talents/Skills Transportation  ADL's:  Intact  Cognition: WNL  Sleep:      Treatment Plan Summary: Medication management  Continue  Cymbalta 60 mg daily. refill for the next 3 months Continue temazepam 15 mg by mouth daily at bedtime when necessary for insomnia Continue  Abilify 2 mg as an add-on to his antidepressant.- Refill for the next 3 months.   Follow-up in 3 months or earlier depending on his symptoms    More than 50% of the time spent in psychoeducation, counseling and coordination of care.    This note was generated in part or whole with voice recognition software. Voice regonition is usually quite accurate but there are transcription errors that can and very often do occur. I apologize for any typographical errors that were not detected and corrected.    Rainey Pines, MD 11/21/20179:01 AM

## 2016-06-08 ENCOUNTER — Ambulatory Visit
Admission: EM | Admit: 2016-06-08 | Discharge: 2016-06-08 | Disposition: A | Discharge status: Home / Self Care | Attending: Family Medicine | Admitting: Family Medicine

## 2016-06-08 DIAGNOSIS — M7711 Lateral epicondylitis, right elbow: Secondary | ICD-10-CM

## 2016-06-08 MED ORDER — MELOXICAM 15 MG PO TABS: 15.0000 mg | ORAL_TABLET | Freq: Every day | ORAL | 0 refills | Status: DC

## 2016-06-08 NOTE — Discharge Instructions (Signed)
Mobic daily for the next 1-2 weeks.  Follow up closely with your PCP.  Take care  Dr. Lacinda Axon

## 2016-06-08 NOTE — ED Triage Notes (Signed)
Patient complains of right elbow pain that started 3 weeks ago. Patient has had no known injury to the area. Patient states that he has pain everytime he moves his hands. Patient states that he has some pressure tenderness in one area as well as some pain that radiates into forearm.

## 2016-06-08 NOTE — ED Provider Notes (Signed)
[facility]    CSN: [id number] Arrival date & time: 06/08/16  1625  History   Chief Complaint Chief Complaint  Patient presents with  . Elbow Pain    Right   HPI  45 year old male presents with complaints of right elbow pain.  Patient reports that he's had right elbow pain for the past 3 weeks. Mild to moderate in severity. He does not recall any fall, trauma, injury. He is very physically active as he is in the [employer]. Exacerbated by movement of the hand and wrist. No medications or interventions tried. No associated swelling. No redness. No other associated symptoms. No other complaints this time.  Past Medical History:  Diagnosis Date  . Allergy   . Anxiety   . Depression   . GERD (gastroesophageal reflux disease)   . High cholesterol   . Hx of migraines   . Hypertension     Patient Active Problem List   Diagnosis Date Noted  . Generalized anxiety disorder 10/30/2015  . Major depressive disorder, recurrent episode, severe ([place]) 10/30/2015  . Social anxiety disorder 10/30/2015  . Depression 10/09/2015  . Heartburn 10/09/2015  . High blood pressure 10/09/2015  . Acute anxiety 10/09/2015    Past Surgical History:  Procedure Laterality Date  . FOOT SURGERY  2002   Bone spur  . TONSILLECTOMY      Home Medications    Prior to Admission medications   Medication Sig Start Date End Date Taking? Authorizing Provider  ARIPiprazole (ABILIFY) 2 MG tablet Take 1 tablet (2 mg total) by mouth daily. 05/25/16  Yes Rainey Pines, MD  Azilsartan-Chlorthalidone (EDARBYCLOR) 40-25 MG TABS Take 1 tablet by mouth daily. 10/09/15  Yes Arlis Porta., MD  DULoxetine (CYMBALTA) 60 MG capsule Take 1 capsule (60 mg total) by mouth daily. 04/29/16  Yes Rainey Pines, MD  pravastatin (PRAVACHOL) 20 MG tablet take 1 tablet by mouth once daily 04/26/16  Yes Arlis Porta., MD  temazepam (RESTORIL) 15 MG capsule Take 1 capsule (15 mg total) by mouth at bedtime as needed  for sleep. 05/25/16  Yes Rainey Pines, MD  meloxicam (MOBIC) 15 MG tablet Take 1 tablet (15 mg total) by mouth daily. 06/08/16   Coral Spikes, DO    Family History Family History  Problem Relation Age of Onset  . Diabetes Mother     Social History Social History  Substance Use Topics  . Smoking status: Former Smoker    Quit date: 07/05/2010  . Smokeless tobacco: Never Used  . Alcohol use 0.0 oz/week     Comment: Occasional    Allergies   Patient has no known allergies.  Review of Systems Review of Systems  Constitutional: Negative.   Musculoskeletal:       Right elbow pain.   Physical Exam Triage Vital Signs ED Triage Vitals  Enc Vitals Group     BP 06/08/16 1701 (!) 142/95     Pulse Rate 06/08/16 1701 78     Resp 06/08/16 1701 17     Temp 06/08/16 1701 98.2 F (36.8 C)     Temp Source 06/08/16 1701 Oral     SpO2 06/08/16 1701 99 %     Weight 06/08/16 1700 236 lb (107 kg)     Height 06/08/16 1700 6\' 1"  (1.854 m)     Head Circumference --      Peak Flow --      Pain Score 06/08/16 1700 6     Pain  Loc --      Pain Edu? --      Excl. in [place]? --    Updated Vital Signs BP (!) 142/95 (BP Location: Left Arm)   Pulse 78   Temp 98.2 F (36.8 C) (Oral)   Resp 17   Ht 6\' 1"  (1.854 m)   Wt 236 lb (107 kg)   SpO2 99%   BMI 31.14 kg/m    Physical Exam  Constitutional: He is oriented to person, place, and time. He appears well-developed. No distress.  Pulmonary/Chest: Effort normal.  Musculoskeletal:  Right elbow with tenderness at the lateral epicondyle. No erythema. No swelling.  Neurological: He is alert and oriented to person, place, and time.  Psychiatric: He has a normal mood and affect.  Vitals reviewed.  UC Treatments / Results  Labs (all labs ordered are listed, but only abnormal results are displayed) Labs Reviewed - No data to display  EKG  EKG Interpretation None       Radiology No results found.  Procedures Procedures (including critical  care time)  Medications Ordered in UC Medications - No data to display  Initial Impression / Assessment and Plan / UC Course  I have reviewed the triage vital signs and the nursing notes.  Pertinent labs & imaging results that were available during my care of the patient were reviewed by me and considered in my medical decision making (see chart for details).  Clinical Course   45 year old male presents with signs and symptoms of lateral epicondylitis. Treating with meloxicam and compression.  Final Clinical Impressions(s) / UC Diagnoses   Final diagnoses:  Lateral epicondylitis of right elbow   New Prescriptions Discharge Medication List as of 06/08/2016  5:24 PM    START taking these medications   Details  meloxicam (MOBIC) 15 MG tablet Take 1 tablet (15 mg total) by mouth daily., Starting Tue 06/08/2016, Normal         Coral Spikes, DO 06/08/16 1731

## 2016-06-11 ENCOUNTER — Telehealth: Payer: Self-pay

## 2016-06-11 NOTE — Telephone Encounter (Signed)
Courtesy call back completed today for patient's recent visit at [facility]. Patient did not answer, left message on machine to call back with any questions or concerns.   

## 2016-06-24 ENCOUNTER — Other Ambulatory Visit: Payer: Self-pay | Admitting: Family Medicine

## 2016-07-18 ENCOUNTER — Ambulatory Visit
Admission: EM | Admit: 2016-07-18 | Discharge: 2016-07-18 | Disposition: A | Discharge status: Home / Self Care | Attending: Emergency Medicine | Admitting: Emergency Medicine

## 2016-07-18 ENCOUNTER — Ambulatory Visit (INDEPENDENT_AMBULATORY_CARE_PROVIDER_SITE_OTHER)

## 2016-07-18 DIAGNOSIS — S82841A Displaced bimalleolar fracture of right lower leg, initial encounter for closed fracture: Secondary | ICD-10-CM

## 2016-07-18 MED ORDER — HYDROCODONE-ACETAMINOPHEN 5-325 MG PO TABS: 1.0000 | ORAL_TABLET | Freq: Four times a day (QID) | ORAL | 0 refills | Status: DC | PRN

## 2016-07-18 NOTE — ED Triage Notes (Signed)
Pt jumped from a retaining wall last night approx 6 feet down and landed wrong and it started to hurt immediately. Right ankle

## 2016-07-18 NOTE — ED Provider Notes (Signed)
CSN: [id number]     Arrival date & time 07/18/16  1240 History   None    Chief Complaint  Patient presents with  . Ankle Pain   (Consider location/radiation/quality/duration/timing/severity/associated sxs/prior Treatment) HPI  A 46 year old male who presents with right lateral ankle pain. He states that today he jumped over a retaining wall landing approximately 6 feet down. Sitting immediate pain. He thinks he may have twisted it inward. He was Trying to flag down a Psychologist, sport and exercise. Since the Accident he has had difficulty walking. He does have swelling over the lateral malleolus.      Past Medical History:  Diagnosis Date  . Allergy   . Anxiety   . Depression   . GERD (gastroesophageal reflux disease)   . High cholesterol   . Hx of migraines   . Hypertension    Past Surgical History:  Procedure Laterality Date  . FOOT SURGERY  2002   Bone spur  . TONSILLECTOMY     Family History  Problem Relation Age of Onset  . Diabetes Mother    Social History  Substance Use Topics  . Smoking status: Former Smoker    Quit date: 07/05/2010  . Smokeless tobacco: Never Used  . Alcohol use 0.0 oz/week     Comment: Occasional     Review of Systems  Constitutional: Positive for activity change. Negative for chills, fatigue and fever.  Musculoskeletal: Positive for arthralgias and myalgias.  Skin: Positive for color change. Negative for wound.  All other systems reviewed and are negative.   Allergies  Patient has no known allergies.  Home Medications   Prior to Admission medications   Medication Sig Start Date End Date Taking? Authorizing Provider  ARIPiprazole (ABILIFY) 2 MG tablet Take 1 tablet (2 mg total) by mouth daily. 05/25/16  Yes Rainey Pines, MD  Azilsartan-Chlorthalidone (EDARBYCLOR) 40-25 MG TABS Take 1 tablet by mouth daily. 10/09/15  Yes Arlis Porta., MD  DULoxetine (CYMBALTA) 60 MG capsule Take 1 capsule (60 mg total) by mouth daily. 04/29/16  Yes Rainey Pines, MD   pravastatin (PRAVACHOL) 20 MG tablet take 1 tablet by mouth once daily (PLEASE SCHEDULE APPOINTMENT FOR REFILLS) 06/24/16  Yes Arlis Porta., MD  temazepam (RESTORIL) 15 MG capsule Take 1 capsule (15 mg total) by mouth at bedtime as needed for sleep. 05/25/16  Yes Rainey Pines, MD  HYDROcodone-acetaminophen (NORCO/VICODIN) 5-325 MG tablet Take 1-2 tablets by mouth every 6 (six) hours as needed. 07/18/16   Lorin Picket, PA-C   Meds Ordered and Administered this Visit  Medications - No data to display  BP 131/81 (BP Location: Right Arm)   Pulse 99   Temp 98.8 F (37.1 C) (Oral)   Resp 18   Ht 6\' 1"  (1.854 m)   Wt 236 lb (107 kg)   SpO2 98%   BMI 31.14 kg/m  No data found.   Physical Exam  Constitutional: He is oriented to person, place, and time. He appears well-developed and well-nourished. No distress.  HENT:  Head: Normocephalic and atraumatic.  Eyes: EOM are normal. Pupils are equal, round, and reactive to light. Right eye exhibits no discharge. Left eye exhibits no discharge.  Neck: Normal range of motion. Neck supple.  Musculoskeletal: He exhibits edema and tenderness.  Examination of the right ankle and foot shows swelling over the lateral malleolus. Patient has decreased range of motion due to discomfort. There is also tenderness of the proximal proximal tarsals. Vascular function is intact distally.  Neurological: He is alert and oriented to person, place, and time.  Skin: Skin is warm and dry. He is not diaphoretic.  Psychiatric: He has a normal mood and affect. His behavior is normal. Judgment and thought content normal.  Nursing note and vitals reviewed.   Urgent Care Course   Clinical Course     Procedures (including critical care time)  Labs Review Labs Reviewed - No data to display  Imaging Review Dg Ankle Complete Right  Result Date: 07/18/2016 CLINICAL DATA:  Lateral RIGHT ankle pain after falling yesterday evening when he jumped off a retaining  wall, initial encounter EXAM: RIGHT ANKLE - COMPLETE 3+ VIEW COMPARISON:  None FINDINGS: Osseous mineralization normal. Lateral soft tissue swelling. Ankle joint alignment appears normal. Nondisplaced lateral malleolar fracture. Nondisplaced posterior malleolar fracture also identified. Medial malleolus appears intact. No additional fracture, dislocation, or bone destruction. IMPRESSION: Nondisplaced fractures of the lateral malleolus and posterior malleolus. Electronically Signed   By: Lavonia Dana M.D.   On: 07/18/2016 15:04     Visual Acuity Review  Right Eye Distance:   Left Eye Distance:   Bilateral Distance:    Right Eye Near:   Left Eye Near:    Bilateral Near:     Boot orthosis was applied to the right lower extremity and patient was given crutches with instructions for touchdown ambulation only.    MDM   1. Bimalleolar fracture of right ankle, closed, initial encounter   Undisplaced fracture of the distal fibula and posterior malleolus Discharge Medication List as of 07/18/2016  3:58 PM    Plan: 1. Test/x-ray results and diagnosis reviewed with patient 2. rx as per orders; risks, benefits, potential side effects reviewed with patient 3. Recommend supportive treatment with elevation; was instructed to  the patient. Ice  when he is out of the boot only for non-weightbearing times. We will send him for to orthopedics next week for evaluation and care. I've asked him to use Motrin for pain control and given a few Vicodin 4 times are more painful. He should use crutches at all times when he is Lipitor. 4. F/u prn if symptoms worsen or don't improve     Lorin Picket, PA-C 07/18/16 1652

## 2016-07-20 ENCOUNTER — Other Ambulatory Visit: Payer: Self-pay | Admitting: Family Medicine

## 2016-07-20 ENCOUNTER — Ambulatory Visit: Payer: Self-pay | Admitting: Family Medicine

## 2016-07-20 DIAGNOSIS — S82841A Displaced bimalleolar fracture of right lower leg, initial encounter for closed fracture: Secondary | ICD-10-CM

## 2016-07-21 ENCOUNTER — Other Ambulatory Visit: Payer: Self-pay | Admitting: Family Medicine

## 2016-08-25 ENCOUNTER — Ambulatory Visit: Admitting: Psychiatry

## 2016-09-15 ENCOUNTER — Other Ambulatory Visit: Payer: Self-pay | Admitting: Family Medicine

## 2016-09-15 ENCOUNTER — Ambulatory Visit (INDEPENDENT_AMBULATORY_CARE_PROVIDER_SITE_OTHER): Admitting: Psychiatry

## 2016-09-15 ENCOUNTER — Encounter: Payer: Self-pay | Admitting: Psychiatry

## 2016-09-15 VITALS — BP 171/121 | HR 91 | Temp 98.6°F | Wt 278.0 lb

## 2016-09-15 DIAGNOSIS — F419 Anxiety disorder, unspecified: Secondary | ICD-10-CM

## 2016-09-15 DIAGNOSIS — F331 Major depressive disorder, recurrent, moderate: Secondary | ICD-10-CM | POA: Diagnosis not present

## 2016-09-15 DIAGNOSIS — F4010 Social phobia, unspecified: Secondary | ICD-10-CM

## 2016-09-15 MED ORDER — ARIPIPRAZOLE 2 MG PO TABS: 2.0000 mg | ORAL_TABLET | Freq: Every day | ORAL | 1 refills | Status: DC

## 2016-09-15 MED ORDER — DULOXETINE HCL 60 MG PO CPEP: 60.0000 mg | ORAL_CAPSULE | Freq: Every day | ORAL | 1 refills | Status: DC

## 2016-09-15 NOTE — Telephone Encounter (Signed)
Pt needs a refill on azilsartan chlorthalidone sent to [facility] in [place].  He thinks this is not [facility].  His call back number is [phone]

## 2016-09-15 NOTE — Progress Notes (Signed)
Psychiatric MD Progress Note   Patient Identification: Joseph Wilcox MRN:  [id number] Date of Evaluation:  09/15/2016 Referral Source: [facility]  Chief Complaint:   Chief Complaint    Follow-up; Medication Refill     Visit Diagnosis:    ICD-9-CM ICD-10-CM   1. MDD (major depressive disorder), recurrent episode, moderate (HCC) 296.32 F33.1   2. Social anxiety disorder 300.23 F40.10     History of Present Illness:    Patient is a 46 year old married male who is currently in [employer] presented for follow up. He was referred by his primary care physician Dr. Georgiann Cocker. 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 He reported that he is working hard at this time. He reported that he is compliant with his medications. He stated that he injured his leg few months ago and was in the boots. Now he is improving. He will start doing the physical therapy soon. He reported that he has been compliant with his medications. He currently denied having any suicidal ideations or plans. He appeared calm and energetic as usual. He takes temazepam occasionally at night to help  with sleep.     Associated Signs/Symptoms: Depression Symptoms:  depressed mood, anxiety, (Hypo) Manic Symptoms:  Distractibility, Irritable Mood, Anxiety Symptoms:  Excessive Worry, Panic Symptoms, Psychotic Symptoms:  none PTSD Symptoms: Negative NA  Past Psychiatric History:   He  reported that he has never seen a psychiatrist in the past. He was never admitted to a psychiatric hospital. He has just started taking the Cymbalta.  He reported that he was also diagnosed with ADHD when he had the evaluation done by the vocational rehabilitation in the past. He was taking Ritalin  However he was not prescribed medication for many years and was looking for the prescription. He is currently following with a therapist Patty at the [facility]. He is seeing her on a  weekly basis. He reported that he is improving with his therapy and she is helpful.  Previous Psychotropic Medications:  Recently started on Cymbalta.  Substance Abuse History in the last 12 months:  Yes.   Drinks  alcohol socially.  Consequences of Substance Abuse: Negative NA  Past Medical History:  Past Medical History:  Diagnosis Date  . Allergy   . Anxiety   . Depression   . GERD (gastroesophageal reflux disease)   . High cholesterol   . Hx of migraines   . Hypertension     Past Surgical History:  Procedure Laterality Date  . FOOT SURGERY  2002   Bone spur  . TONSILLECTOMY      Family Psychiatric History:  Patient denied any family history of psychiatric illness.  Family History:  Family History  Problem Relation Age of Onset  . Diabetes Mother     Social History:   Social History   Social History  . Marital status: Unknown    Spouse name: N/A  . Number of children: N/A  . Years of education: N/A   Social History Main Topics  . Smoking status: Former Smoker    Quit date: 07/05/2010  . Smokeless tobacco: Never Used  . Alcohol use 0.0 oz/week     Comment: Occasional   . Drug use: No  . Sexual activity: Yes    Partners: Female    Birth control/ protection: None   Other Topics Concern  . None   Social History Narrative  . None  Additional Social History:  She is currently married for the past 17 years. He has 3 children ages 2120 and 50. 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 He was posted in [place] and [place] in the past. 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 He is again going to be required so on in October to [place], [place] or [place] and is worried about the same  Allergies:  No Known Allergies  Metabolic Disorder Labs: No results found for: HGBA1C, MPG No results found for: PROLACTIN No results found for: CHOL, TRIG,  HDL, CHOLHDL, VLDL, LDLCALC   Current Medications: Current Outpatient Prescriptions  Medication Sig Dispense Refill  . ARIPiprazole (ABILIFY) 2 MG tablet Take 1 tablet (2 mg total) by mouth daily. 90 tablet 1  . Azilsartan-Chlorthalidone (EDARBYCLOR) 40-25 MG TABS Take 1 tablet by mouth daily. 30 tablet 6  . DULoxetine (CYMBALTA) 60 MG capsule Take 1 capsule (60 mg total) by mouth daily. 90 capsule 1  . HYDROcodone-acetaminophen (NORCO/VICODIN) 5-325 MG tablet Take 1-2 tablets by mouth every 6 (six) hours as needed. 10 tablet 0  . pravastatin (PRAVACHOL) 20 MG tablet take 1 tablet by mouth once daily 30 tablet 4  . temazepam (RESTORIL) 15 MG capsule Take 1 capsule (15 mg total) by mouth at bedtime as needed for sleep. 30 capsule 2   No current facility-administered medications for this visit.     Neurologic: Headache: No Seizure: No Paresthesias:No  Musculoskeletal: Strength & Muscle Tone: within normal limits Gait & Station: normal Patient leans: N/A  Psychiatric Specialty Exam: ROS  Blood pressure (!) 171/121, pulse 91, temperature 98.6 F (37 C), temperature source Oral, weight 278 lb (126.1 kg).Body mass index is 36.68 kg/m.  General Appearance: Meticulous  Eye Contact:  Fair  Speech:  Clear and Coherent and Normal Rate  Volume:  Normal  Mood:  Anxious  Affect:  Congruent  Thought Process:  Coherent and Goal Directed  Orientation:  Full (Time, Place, and Person)  Thought Content:  WDL  Suicidal Thoughts:  No  Homicidal Thoughts:  No  Memory:  Immediate;   Fair  Judgement:  Fair  Insight:  Fair  Psychomotor Activity:  Normal  Concentration:  Fair  Recall:  [employer] [place]  Language: Fair  Akathisia:  No  Handed:  Right  AIMS (if indicated):    Assets:  Communication Skills Desire for Improvement Physical Health Social Support Talents/Skills Transportation  ADL's:  Intact  Cognition: WNL  Sleep:      Treatment Plan Summary: Medication  management  Continue Cymbalta 60 mg daily. refill for the next 6 months Continue  Abilify 2 mg as an add-on to his antidepressant.- Refill for the next 6 months. Has enough supply of temazepam.   Follow-up in 6 months or earlier depending on his symptoms    More than 50% of the time spent in psychoeducation, counseling and coordination of care.    This note was generated in part or whole with voice recognition software. Voice regonition is usually quite accurate but there are transcription errors that can and very often do occur. I apologize for any typographical errors that were not detected and corrected.    Rainey Pines, MD 3/14/20189:06 AM

## 2016-09-16 ENCOUNTER — Telehealth: Payer: Self-pay

## 2016-09-16 MED ORDER — AZILSARTAN-CHLORTHALIDONE 40-25 MG PO TABS: 1.0000 | ORAL_TABLET | Freq: Every day | ORAL | 2 refills | Status: DC

## 2016-09-16 NOTE — Telephone Encounter (Signed)
called to make sure that patient notified pt did call pcp and they did refil his medication he going today to pick up.

## 2017-01-12 ENCOUNTER — Ambulatory Visit (INDEPENDENT_AMBULATORY_CARE_PROVIDER_SITE_OTHER): Admitting: Family Medicine

## 2017-01-12 ENCOUNTER — Encounter: Payer: Self-pay | Admitting: Family Medicine

## 2017-01-12 VITALS — BP 156/89 | HR 85 | Temp 98.6°F | Resp 16 | Ht 73.0 in | Wt 280.6 lb

## 2017-01-12 DIAGNOSIS — E669 Obesity, unspecified: Secondary | ICD-10-CM | POA: Diagnosis not present

## 2017-01-12 DIAGNOSIS — R29818 Other symptoms and signs involving the nervous system: Secondary | ICD-10-CM | POA: Diagnosis not present

## 2017-01-12 DIAGNOSIS — K219 Gastro-esophageal reflux disease without esophagitis: Secondary | ICD-10-CM

## 2017-01-12 DIAGNOSIS — I10 Essential (primary) hypertension: Secondary | ICD-10-CM

## 2017-01-12 DIAGNOSIS — Z9989 Dependence on other enabling machines and devices: Secondary | ICD-10-CM

## 2017-01-12 DIAGNOSIS — G4733 Obstructive sleep apnea (adult) (pediatric): Secondary | ICD-10-CM | POA: Insufficient documentation

## 2017-01-12 DIAGNOSIS — Z87891 Personal history of nicotine dependence: Secondary | ICD-10-CM

## 2017-01-12 MED ORDER — AZILSARTAN-CHLORTHALIDONE 40-25 MG PO TABS: 1.0000 | ORAL_TABLET | Freq: Every day | ORAL | 3 refills | Status: DC

## 2017-01-12 NOTE — Assessment & Plan Note (Signed)
Congratulated Encouraged to continue smoking cessation with NRT gum, reviewed proper use and taper plan Unremarkable exam at this time, clinically no red flag symptoms Does not meet criteria for LDCT screening for lung CA Patient and wife concerns about smoking history, interested in testing  Plan: 1. Ordered future CXR can return for imaging at office anytime in next 3 months 2. Recommend scheduling nurse visit for Spirometry in office 3. Follow-up

## 2017-01-12 NOTE — Assessment & Plan Note (Signed)
Suspected chronic GERD with worsening due to trigger foods and former smoker - No GI red flag symptoms. Exam is unremarkable with benign abdomen without pain today - Chronic history of GERD, only intermittent inadequate treatments with OTC H2, PPI - History not suggestive of PUD  Plan: 1. Discussed diagnosis and management / complications of GERD therapy - per patient and wife, preference is to start with more formal GI evaluation, before rx meds 2. Diet modifications reduce GERD 3. Referral to AGI 4. Follow-up 3 months

## 2017-01-12 NOTE — Assessment & Plan Note (Signed)
New problem with clinical concern for suspected obstructive sleep apnea given reported symptoms with witnessed apnea, snoring and sleep disturbance, fatigue but not excessive sleepiness. - Screening: ESS score 0 / STOP-Bang Score 7 - Neck Circumference 19" - Comorbidity with HTN, Mood disorder  Plan: 1. Discussion on initial diagnosis and testing for OSA, risk factors, management, complications 2. Agree to proceed with sleep study testing based on clinical concerns - first advised patient to contact insurance to determine which route for sleep study, Sleep Center vs In Home, notify office once determined, and will place order at that time to initiate testing

## 2017-01-12 NOTE — Progress Notes (Signed)
Subjective:    Patient ID: Joseph Wilcox, male    DOB: Nov 24, 1970, 46 y.o.   MRN: [id number]  Joseph Wilcox is a 46 y.o. male presenting on 01/12/2017 for Hypertension  Patient is accompanied by his wife, Cordarro Spinnato. She provides additional history.  HPI   CHRONIC HTN: Reports not checking BP outside office. Did not follow-up as advised. Ran out of meds. Current Meds - Azilartan-Chlorthalidone 40-25mg  daily (out 3 weeks now, requesting refill)   Previously took regularly when had med Weight gain Denies CP, dyspnea, HA, edema, dizziness / lightheadedness  Suspected OSA This concern primarily raised by his wife, due to witnessed apnea events overnight, choking, gasping for air, snoring. She believes that he seems much more tired and fatigued during day. He is reluctant to admit this but does agree he feels more tired. - No prior sleep study. Asking about ordering one. - Denies excessive sleepiness during day  Obesity BMI >37 Admits significant weight gain over months. Poor lifestyle choices, limited exercise currently.  GERD / Nausae / Vomiting Additional complaint, with chronic history reported >2 years with uncontrolled GERD, describes frequent complaints of heartburn, esophageal burning, epigastric abdominal pain intermittently, sometimes nausea / vomiting. Prior trial on variety of OTC medications with mixed results and limited relief. His wife had prior EGD/Colonoscopy in past, and she is concerned for him that his symptoms never seem to fully resolve. Requesting GI referral today.  Former Smoker / History of Tobacco Abuse Reports smoking history for past >25 years, initially about 1ppd, then decreased last few years to 0.5ppd, he formally quit cigarettes in 2012 approx, then only used regular e-cigs and vaping for past >6 years. - He admits occasional coughing, otherwise no significant symptoms - Interested in screening for lung cancer and testing for COPD or  complications of smoking  History of ADD / Depression / Insomnia Additional complaint today - requesting new ADD med. - Chart review with previously followed by Dr Sharlot Gowda Last visit 09/2016, off Abilify and Temazepam. On Duloxetine still. He used to be treated with Ritalin as well. No longer has access to ADD testing.  ------------------------------------- Epworth Sleepiness Scale Total Score: 0 Sitting and reading - 0 Watching TV - 0 Sitting inactive in a public place - 0 As a passenger in a car for an hour without a break - 0 Lying down to rest in the afternoon when circumstances permit - 0 Sitting and talking to someone - 0 Sitting quietly after a lunch without alcohol - 0 In a car, while stopped for a few minutes in traffic - 0 ----------------------------------- 0-7: It is unlikely that you are abnormally sleepy.  STOP-Bang OSA scoring Snoring yes   Tiredness yes   Observed apneas yes   Pressure HTN yes   BMI > 35 kg/m2 yes   Age > 50  no   Neck (male >17 in; Male >16 in)  yes   Gender male yes   OSA risk low (0-2)  OSA risk intermediate (3-4)  OSA risk high (5+)  Total: 7     Social History  Substance Use Topics  . Smoking status: Former Smoker    Quit date: 07/05/2010  . Smokeless tobacco: Never Used  . Alcohol use 0.0 oz/week     Comment: Occasional     Review of Systems Per HPI unless specifically indicated above     Objective:    BP (!) 156/89 (BP Location: Right Arm, Patient Position: Sitting, Cuff Size: Normal)  Pulse 85   Temp 98.6 F (37 C) (Oral)   Resp 16   Ht 6\' 1"  (1.854 m)   Wt 280 lb 9.6 oz (127.3 kg)   BMI 37.02 kg/m   Wt Readings from Last 3 Encounters:  01/12/17 280 lb 9.6 oz (127.3 kg)  09/15/16 278 lb (126.1 kg)  07/18/16 236 lb (107 kg)    Physical Exam  Constitutional: He is oriented to person, place, and time. He appears well-developed and well-nourished. No distress.  Well-appearing, comfortable, cooperative, obese,  muscular build  HENT:  Head: Normocephalic and atraumatic.  Mouth/Throat: Oropharynx is clear and moist.  Frontal / maxillary sinuses non-tender. Nares patent without purulence or edema. Bilateral TMs clear without erythema, effusion or bulging. Oropharynx clear without erythema, exudates, edema or asymmetry.  S/p tonsillectomy.  Mallampati Score 2 - Complete visualization of uvula  Eyes: Conjunctivae are normal. Right eye exhibits no discharge. Left eye exhibits no discharge.  Neck: Normal range of motion. Neck supple. No thyromegaly present.  Neck circumference 19"  Cardiovascular: Normal rate, regular rhythm, normal heart sounds and intact distal pulses.   No murmur heard. Pulmonary/Chest: Effort normal and breath sounds normal. No respiratory distress. He has no wheezes. He has no rales.  Good air movement. Speaks full sentences. No focal abnormality.  Abdominal: Soft. Bowel sounds are normal. He exhibits no distension and no mass. There is no tenderness. There is no rebound.  Musculoskeletal: Normal range of motion. He exhibits no edema.  Lymphadenopathy:    He has no cervical adenopathy.  Neurological: He is alert and oriented to person, place, and time.  Skin: Skin is warm and dry. No rash noted. He is not diaphoretic. No erythema.  Psychiatric: He has a normal mood and affect. His behavior is normal.  Well groomed, good eye contact, normal speech and thoughts  Nursing note and vitals reviewed.   Results for orders placed or performed during the hospital encounter of 60/73/71  Basic metabolic panel  Result Value Ref Range   Sodium 133 (L) 135 - 145 mmol/L   Potassium 3.2 (L) 3.5 - 5.1 mmol/L   Chloride 99 (L) 101 - 111 mmol/L   CO2 24 22 - 32 mmol/L   Glucose, Bld 97 65 - 99 mg/dL   BUN 6 6 - 20 mg/dL   Creatinine, Ser 1.04 0.61 - 1.24 mg/dL   Calcium 8.5 (L) 8.9 - 10.3 mg/dL   GFR calc non Af Amer >60 >60 mL/min   GFR calc Af Amer >60 >60 mL/min   Anion gap 10 5 - 15    CBC  Result Value Ref Range   WBC 13.2 (H) 3.8 - 10.6 K/uL   RBC 5.96 (H) 4.40 - 5.90 MIL/uL   Hemoglobin 12.8 (L) 13.0 - 18.0 g/dL   HCT 39.5 (L) 40.0 - 52.0 %   MCV 66.3 (L) 80.0 - 100.0 fL   MCH 21.5 (L) 26.0 - 34.0 pg   MCHC 32.4 32.0 - 36.0 g/dL   RDW 15.5 (H) 11.5 - 14.5 %   Platelets 293 150 - 440 K/uL  Troponin I  Result Value Ref Range   Troponin I <0.03 <0.031 ng/mL      Assessment & Plan:   Problem List Items Addressed This Visit    Suspected sleep apnea    New problem with clinical concern for suspected obstructive sleep apnea given reported symptoms with witnessed apnea, snoring and sleep disturbance, fatigue but not excessive sleepiness. - Screening: ESS score 0 /  STOP-Bang Score 7 - Neck Circumference 19" - Comorbidity with HTN, Mood disorder  Plan: 1. Discussion on initial diagnosis and testing for OSA, risk factors, management, complications 2. Agree to proceed with sleep study testing based on clinical concerns - first advised patient to contact insurance to determine which route for sleep study, Sleep Center vs In Home, notify office once determined, and will place order at that time to initiate testing      Obesity (BMI 35.0-39.9 without comorbidity)    Abnormal weight gain, BMI >37 Attributed to poor lifestyle Encouraged improved diet/exercise Follow-up, discuss further options in future      GERD (gastroesophageal reflux disease)    Suspected chronic GERD with worsening due to trigger foods and former smoker - No GI red flag symptoms. Exam is unremarkable with benign abdomen without pain today - Chronic history of GERD, only intermittent inadequate treatments with OTC H2, PPI - History not suggestive of PUD  Plan: 1. Discussed diagnosis and management / complications of GERD therapy - per patient and wife, preference is to start with more formal GI evaluation, before rx meds 2. Diet modifications reduce GERD 3. Referral to AGI 4. Follow-up 3  months      Relevant Orders   Ambulatory referral to Gastroenterology   Former smoker    Congratulated Encouraged to continue smoking cessation with NRT gum, reviewed proper use and taper plan Unremarkable exam at this time, clinically no red flag symptoms Does not meet criteria for LDCT screening for lung CA Patient and wife concerns about smoking history, interested in testing  Plan: 1. Ordered future CXR can return for imaging at office anytime in next 3 months 2. Recommend scheduling nurse visit for Spirometry in office 3. Follow-up      Relevant Orders   DG Chest 2 View   Essential hypertension - Primary    Mildly elevated initial BP, repeat manual check not normal, attributed to off BP med >3 weeks. - Home BP readings: not available  No known complications - anticipate underlying OSA is contributing   Plan:  1. Continue current BP regimen - refilled Azilartan-Chlorthalidone 40-25mg  daily 90 day supply +3 refills (given handwritten rx due to temporary EHR downtime) 2. Encourage improved lifestyle - low sodium diet, regular exercise 3. Start monitor BP outside office, bring readings to next visit, if persistently >140/90 or new symptoms notify office sooner 4. Follow-up 3 months      Relevant Medications   Azilsartan-Chlorthalidone (EDARBYCLOR) 40-25 MG TABS    Other Visit Diagnoses    History of tobacco abuse       Relevant Orders   DG Chest 2 View      Deferred discussion and management of potential ADD at this time, advised will need to review prior ADD testing or proceed with referral to update ADD testing, and consider med options in future.  Meds ordered this encounter  Medications  .       .       . Azilsartan-Chlorthalidone (EDARBYCLOR) 40-25 MG TABS    Sig: Take 1 tablet by mouth daily. Patient needs appointment in April or May to continue to get medication.    Dispense:  90 tablet    Refill:  3    Follow up plan: Return in about 3 months (around  04/14/2017).  Nobie Putnam, DO [facility] 01/12/2017, 11:22 PM

## 2017-01-12 NOTE — Assessment & Plan Note (Signed)
Mildly elevated initial BP, repeat manual check not normal, attributed to off BP med >3 weeks. - Home BP readings: not available  No known complications - anticipate underlying OSA is contributing   Plan:  1. Continue current BP regimen - refilled Azilartan-Chlorthalidone 40-25mg  daily 90 day supply +3 refills (given handwritten rx due to temporary EHR downtime) 2. Encourage improved lifestyle - low sodium diet, regular exercise 3. Start monitor BP outside office, bring readings to next visit, if persistently >140/90 or new symptoms notify office sooner 4. Follow-up 3 months

## 2017-01-12 NOTE — Patient Instructions (Addendum)
Thank you for coming to the clinic today.  1. Refilled HTN med, check BP at home  2. Referral to [facility]  3. Call us back with preference of Sleep Study, sleep center or in home, based on ins - then we will order  Please schedule Nurse Only visit for Spirometry, also Chest X-ray anytime in next 3 months  Please schedule a Follow-up Appointment to: Return in about 3 months (around 04/14/2017).  If you have any other questions or concerns, please feel free to call the clinic or send a message through [facility]. You may also schedule an earlier appointment if necessary.  Additionally, you may be receiving a survey about your experience at our clinic within a few days to 1 week by e-mail or mail. We value your feedback.  Nobie Putnam, DO [place]

## 2017-01-12 NOTE — Assessment & Plan Note (Signed)
Abnormal weight gain, BMI >37 Attributed to poor lifestyle Encouraged improved diet/exercise Follow-up, discuss further options in future

## 2017-01-19 ENCOUNTER — Other Ambulatory Visit: Payer: Self-pay

## 2017-01-19 MED ORDER — PRAVASTATIN SODIUM 20 MG PO TABS: 20.0000 mg | ORAL_TABLET | Freq: Every day | ORAL | 6 refills | Status: DC

## 2017-02-21 ENCOUNTER — Other Ambulatory Visit: Payer: Self-pay | Admitting: Gastroenterology

## 2017-02-21 ENCOUNTER — Encounter: Payer: Self-pay | Admitting: Gastroenterology

## 2017-02-21 ENCOUNTER — Other Ambulatory Visit
Admission: RE | Admit: 2017-02-21 | Discharge: 2017-02-21 | Disposition: A | Discharge status: Home / Self Care | Source: Ambulatory Visit | Attending: Gastroenterology | Admitting: Gastroenterology

## 2017-02-21 ENCOUNTER — Ambulatory Visit (INDEPENDENT_AMBULATORY_CARE_PROVIDER_SITE_OTHER): Admitting: Gastroenterology

## 2017-02-21 VITALS — BP 150/114 | HR 103 | Temp 98.8°F | Ht 73.0 in | Wt 277.2 lb

## 2017-02-21 DIAGNOSIS — D509 Iron deficiency anemia, unspecified: Secondary | ICD-10-CM

## 2017-02-21 DIAGNOSIS — K219 Gastro-esophageal reflux disease without esophagitis: Secondary | ICD-10-CM

## 2017-02-21 LAB — CBC
HCT: 37.4 % — ABNORMAL LOW (ref 40.0–52.0)
Hemoglobin: 12.5 g/dL — ABNORMAL LOW (ref 13.0–18.0)
MCH: 22.4 pg — ABNORMAL LOW (ref 26.0–34.0)
MCHC: 33.5 g/dL (ref 32.0–36.0)
MCV: 66.7 fL — ABNORMAL LOW (ref 80.0–100.0)
Platelets: 350 10*3/uL (ref 150–440)
RBC: 5.61 MIL/uL (ref 4.40–5.90)
RDW: 15.7 % — ABNORMAL HIGH (ref 11.5–14.5)
WBC: 12.5 10*3/uL — ABNORMAL HIGH (ref 3.8–10.6)

## 2017-02-21 LAB — BASIC METABOLIC PANEL
Anion gap: 10 (ref 5–15)
BUN: 11 mg/dL (ref 6–20)
CO2: 29 mmol/L (ref 22–32)
Calcium: 9.5 mg/dL (ref 8.9–10.3)
Chloride: 100 mmol/L — ABNORMAL LOW (ref 101–111)
Creatinine, Ser: 1.26 mg/dL — ABNORMAL HIGH (ref 0.61–1.24)
GFR calc Af Amer: >60 mL/min (ref >60)
GFR calc non Af Amer: >60 mL/min (ref >60)
Glucose, Bld: 91 mg/dL (ref 65–99)
Potassium: 3.5 mmol/L (ref 3.5–5.1)
Sodium: 139 mmol/L (ref 135–145)

## 2017-02-21 LAB — HEPATIC FUNCTION PANEL
ALT: 28 U/L (ref 17–63)
AST: 30 U/L (ref 15–41)
Albumin: 4.3 g/dL (ref 3.5–5.0)
Alkaline Phosphatase: 59 U/L (ref 38–126)
Bilirubin, Direct: <0.1 mg/dL — ABNORMAL LOW (ref 0.1–0.5)
Total Bilirubin: 0.7 mg/dL (ref 0.3–1.2)
Total Protein: 7.2 g/dL (ref 6.5–8.1)

## 2017-02-21 LAB — IRON AND TIBC
Iron: 55 ug/dL (ref 45–182)
Saturation Ratios: 13 % — ABNORMAL LOW (ref 17.9–39.5)
TIBC: 410 ug/dL (ref 250–450)
UIBC: 355 ug/dL

## 2017-02-21 LAB — FERRITIN: Ferritin: 24 ng/mL (ref 24–336)

## 2017-02-21 MED ORDER — OMEPRAZOLE MAGNESIUM 20 MG PO TBEC: 20.0000 mg | DELAYED_RELEASE_TABLET | Freq: Two times a day (BID) | ORAL | 1 refills | Status: DC

## 2017-02-21 NOTE — Progress Notes (Signed)
Joseph Darby, MD [address]  [place]  [place], [place] [address]  Main: [phone]  Fax: [phone]    Gastroenterology Consultation  Referring Provider:     Nobie Wilcox * Primary Care Physician:  Joseph Hauser, DO Primary Gastroenterologist:  Dr. Cephas Wilcox Reason for Consultation:     GERD        HPI:   Joseph Wilcox is a 46 y.o. y/o male referred for consultation & management of GERD by Dr. Parks Wilcox, Joseph Doughty, DO .  Heart burn 5+ years Constant burning in the chest, regurgitation, vomiting, cough, choking spells, wakes up at night , denies dysphagia,  Fatty foods make symptoms worse Prilosec helped but wasn't consistently taking Tums help Gained weight 30lbs in last 65months after being immobilized due to broken leg after he jumped off a retainer wall Denies any other upper GI symptoms nor lower GI symptoms He denies taking NSAIDs He does have microcytic anemia based on labs from 02/2015 Drinks socially, smoking occasional, No IVDA, works for [employer] in [place] Mother GERD, no GI malignancy in the family  GI Procedures: None  Past Medical History:  Diagnosis Date  . Allergy   . Anxiety   . Depression   . GERD (gastroesophageal reflux disease)   . High cholesterol   . Hx of migraines   . Hypertension     Past Surgical History:  Procedure Laterality Date  . FOOT SURGERY  2002   Bone spur  . TONSILLECTOMY      Prior to Admission medications   Medication Sig Start Date End Date Taking? Authorizing Provider  ARIPiprazole (ABILIFY) 2 MG tablet Take 1 tablet (2 mg total) by mouth daily. 09/15/16  Yes Joseph Pines, MD  Azilsartan-Chlorthalidone (EDARBYCLOR) 40-25 MG TABS Take 1 tablet by mouth daily. Patient needs appointment in April or May to continue to get medication. 01/12/17  Yes Joseph Wilcox, Joseph Doughty, DO  DULoxetine (CYMBALTA) 60 MG capsule Take 1 capsule (60 mg total) by mouth daily. 09/15/16   Yes Joseph Pines, MD  pravastatin (PRAVACHOL) 20 MG tablet Take 1 tablet (20 mg total) by mouth daily. 01/19/17  Yes Joseph Wilcox, Joseph Doughty, DO    Family History  Problem Relation Age of Onset  . Diabetes Mother      Social History  Substance Use Topics  . Smoking status: Former Smoker    Quit date: 07/05/2010  . Smokeless tobacco: Never Used  . Alcohol use 0.0 oz/week     Comment: Occasional     Allergies as of 02/21/2017  . (No Known Allergies)    Review of Systems:    All systems reviewed and negative except where noted in HPI.   Physical Exam:  BP (!) 150/114   Pulse (!) 103   Temp 98.8 F (37.1 C) (Oral)   Ht 6\' 1"  (1.854 m)   Wt 125.7 kg (277 lb 3.2 oz)   BMI 36.57 kg/m  No LMP for male patient.  General:   Alert,  Well-developed, well-nourished, pleasant and cooperative in NAD Head:  Normocephalic and atraumatic. Eyes:  Sclera clear, no icterus.   Conjunctiva pink. Ears:  Normal auditory acuity. Nose:  No deformity, discharge, or lesions. Mouth:  No deformity or lesions,oropharynx pink & moist. Neck:  Supple; no masses or thyromegaly. Lungs:  Respirations even and unlabored.  Clear throughout to auscultation.   No wheezes, crackles, or rhonchi. No acute distress. Heart:  Regular rate and rhythm; no murmurs, clicks,  rubs, or gallops. Abdomen:  Normal bowel sounds.  No bruits.  Soft, non-tender and non-distended without masses, hepatosplenomegaly or hernias noted.  No guarding or rebound tenderness.   Rectal: Nor performed Msk:  Symmetrical without gross deformities. Good, equal movement & strength bilaterally. Pulses:  Normal pulses noted. Extremities:  No clubbing or edema.  No cyanosis. Neurologic:  Alert and oriented x3;  grossly normal neurologically. Skin:  Intact without significant lesions or rashes. No jaundice. Lymph Nodes:  No significant cervical adenopathy. Psych:  Alert and cooperative. Normal mood and affect.  Imaging Studies: Ultrasound  abdomen 07/25/2012 normal liver, spleen, CBD, gallbladder  Assessment and Plan:   Joseph Wilcox is a 46 y.o. y/o male with Chronic GERD, on Prilosec intermittently. He does have iron deficiency anemia.  1. Start prilosec 20mg  two times daily before meals 2. Encouraged to lose weight, follow low carb, low fat diet and daily exercise 3. Check CBC, LFTs, BMP, iron studies for microcytic anemia. If anemic, will perform EGD 4. Anti reflux measures   Follow up in 3 months   Joseph Darby, MD   Addendum:  Labs today revealed iron deficiency anemia. I will perform EGD and a colonoscopy, will contact patient  Joseph Darby, MD [address]  [place]  [place], [place] [address]  Main: [phone]  Fax: [phone] Pager: [phone]

## 2017-02-21 NOTE — Patient Instructions (Signed)
1. Start prilosec 20mg  two times daily before meals 2. Encourage to lose weight, follow low carb, low fat diet and daily exercise 3. Check CBC today and if anemic, will perform EGD 4. Anti reflux measures  Please call our office to speak with my nurse Driscilla Grammes at [phone] during business hours from 8am to 4pm if you have any questions/concerns. During after hours, you will be redirected to on call GI physician. For any emergency please call 911 or go the nearest emergency room.    Cephas Darby, MD [address]  [place]  [place], [place] [address]  Main: [phone]  Fax: [phone]

## 2017-02-23 ENCOUNTER — Telehealth: Payer: Self-pay

## 2017-02-23 ENCOUNTER — Other Ambulatory Visit: Payer: Self-pay

## 2017-02-23 DIAGNOSIS — D509 Iron deficiency anemia, unspecified: Secondary | ICD-10-CM

## 2017-02-23 DIAGNOSIS — K219 Gastro-esophageal reflux disease without esophagitis: Secondary | ICD-10-CM

## 2017-02-23 NOTE — Telephone Encounter (Signed)
Patient notified labs indicate iron defic. Anemia and has been asked to start iron 324mg  1-2 times daily.  He has been scheduled colonoscopy for 03/17/17 and EGD for GERD

## 2017-02-23 NOTE — Telephone Encounter (Signed)
Gastroenterology Pre-Procedure Review  Request Date: 03/17/17 Requesting Physician: Dr. Marius Ditch  PATIENT REVIEW QUESTIONS: The patient responded to the following health history questions as indicated:    1. Are you having any GI issues? yes (GERD ) 2. Do you have a personal history of Polyps? no 3. Do you have a family history of Colon Cancer or Polyps? no 4. Diabetes Mellitus? no 5. Joint replacements in the past 12 months?no 6. Major health problems in the past 3 months?no 7. Any artificial heart valves, MVP, or defibrillator?no    MEDICATIONS & ALLERGIES:    Patient reports the following regarding taking any anticoagulation/antiplatelet therapy:   Plavix, Coumadin, Eliquis, Xarelto, Lovenox, Pradaxa, Brilinta, or Effient? no Aspirin? no  Patient confirms/reports the following medications:  Current Outpatient Prescriptions  Medication Sig Dispense Refill  . ARIPiprazole (ABILIFY) 2 MG tablet Take 1 tablet (2 mg total) by mouth daily. 90 tablet 1  . Azilsartan-Chlorthalidone (EDARBYCLOR) 40-25 MG TABS Take 1 tablet by mouth daily. Patient needs appointment in April or May to continue to get medication. 90 tablet 3  . DULoxetine (CYMBALTA) 60 MG capsule Take 1 capsule (60 mg total) by mouth daily. 90 capsule 1  . omeprazole (PRILOSEC OTC) 20 MG tablet Take 1 tablet (20 mg total) by mouth 2 (two) times daily before lunch and supper. 180 tablet 1  . pravastatin (PRAVACHOL) 20 MG tablet Take 1 tablet (20 mg total) by mouth daily. 30 tablet 6   No current facility-administered medications for this visit.     Patient confirms/reports the following allergies:  No Known Allergies  No orders of the defined types were placed in this encounter.   AUTHORIZATION INFORMATION Primary Insurance: 1D#: Group #:  Secondary Insurance: 1D#: Group #:  SCHEDULE INFORMATION: Date: 03/17/17 Time: Location: [place]

## 2017-03-14 ENCOUNTER — Ambulatory Visit (INDEPENDENT_AMBULATORY_CARE_PROVIDER_SITE_OTHER): Admitting: Psychiatry

## 2017-03-14 ENCOUNTER — Encounter: Payer: Self-pay | Admitting: Psychiatry

## 2017-03-14 VITALS — BP 125/84 | HR 90 | Temp 99.6°F | Wt 272.0 lb

## 2017-03-14 DIAGNOSIS — F330 Major depressive disorder, recurrent, mild: Secondary | ICD-10-CM

## 2017-03-14 DIAGNOSIS — F419 Anxiety disorder, unspecified: Secondary | ICD-10-CM

## 2017-03-14 MED ORDER — ARIPIPRAZOLE 2 MG PO TABS: 2.0000 mg | ORAL_TABLET | Freq: Every day | ORAL | 1 refills | Status: DC

## 2017-03-14 MED ORDER — DULOXETINE HCL 60 MG PO CPEP: 60.0000 mg | ORAL_CAPSULE | Freq: Every day | ORAL | 1 refills | Status: DC

## 2017-03-14 NOTE — Progress Notes (Signed)
Psychiatric MD Progress Note   Patient Identification: Joseph Wilcox MRN:  [id number] Date of Evaluation:  03/14/2017 Referral Source: [facility]  Chief Complaint:   Chief Complaint    Follow-up; Medication Refill     Visit Diagnosis:    ICD-10-CM   1. Depression, major, recurrent, mild ([place]) F33.0   2. Acute anxiety F41.9 DULoxetine (CYMBALTA) 60 MG capsule    History of Present Illness:    Patient is a 46 year old married male who is currently in [employer] presented for follow up. d. He reported that he is working hard at this time. He reported that he Is working this weekend. He appeared calm and alert during the interview. He reported that he enjoyed his summer break with his family and did some short trips to the beach. He stated that his depression and anxiety are under control. He sleeps well at night. Patient reported that the medications are helping him and he is not having any acute symptoms at this time. We did discuss about medication compliant. He appeared calm and alert during the interview. He denied having any suicidal homicidal ideations or plans   . He appeared calm and energetic as usual.   Associated Signs/Symptoms: Depression Symptoms:  anxiety, (Hypo) Manic Symptoms:  Labiality of Mood, Anxiety Symptoms:  Panic Symptoms, Psychotic Symptoms:  none PTSD Symptoms: Negative NA  Past Psychiatric History:   He  reported that he has never seen a psychiatrist in the past. He was never admitted to a psychiatric hospital. He has just started taking the Cymbalta.  He reported that he was also diagnosed with ADHD when he had the evaluation done by the vocational rehabilitation in the past. He was taking Ritalin  However he was not prescribed medication for many years and was looking for the prescription. He is currently following with a therapist Patty at the [facility]. He is seeing her on a weekly basis. He reported that he is improving with his therapy  and she is helpful.  Previous Psychotropic Medications:  Recently started on Cymbalta.  Substance Abuse History in the last 12 months:  Yes.   Drinks  alcohol socially.  Consequences of Substance Abuse: Negative NA  Past Medical History:  Past Medical History:  Diagnosis Date  . Allergy   . Anxiety   . Depression   . GERD (gastroesophageal reflux disease)   . High cholesterol   . Hx of migraines   . Hypertension     Past Surgical History:  Procedure Laterality Date  . FOOT SURGERY  2002   Bone spur  . TONSILLECTOMY      Family Psychiatric History:  Patient denied any family history of psychiatric illness.  Family History:  Family History  Problem Relation Age of Onset  . Diabetes Mother     Social History:   Social History   Social History  . Marital status: Unknown    Spouse name: N/A  . Number of children: N/A  . Years of education: N/A   Social History Main Topics  . Smoking status: Former Smoker    Quit date: 07/05/2010  . Smokeless tobacco: Never Used  . Alcohol use 0.0 oz/week     Comment: Occasional   . Drug use: No  . Sexual activity: Yes    Partners: Female    Birth control/ protection: None   Other Topics Concern  . None   Social History Narrative  . None    Additional Social History:  She is currently  married for the past 17 years. He has 3 children ages 2120 and 73. 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 He was posted in [place] and [place] in the past. 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 He is again going to be required so on in October to [place], [place] or [place] and is worried about the same  Allergies:  No Known Allergies  Metabolic Disorder Labs: No results found for: HGBA1C, MPG No results found for: PROLACTIN No results found for: CHOL, TRIG, HDL, CHOLHDL, VLDL, LDLCALC   Current Medications: Current  Outpatient Prescriptions  Medication Sig Dispense Refill  . ARIPiprazole (ABILIFY) 2 MG tablet Take 1 tablet (2 mg total) by mouth daily. 90 tablet 1  . Azilsartan-Chlorthalidone (EDARBYCLOR) 40-25 MG TABS Take 1 tablet by mouth daily. Patient needs appointment in April or May to continue to get medication. 90 tablet 3  . DULoxetine (CYMBALTA) 60 MG capsule Take 1 capsule (60 mg total) by mouth daily. 90 capsule 1  . omeprazole (PRILOSEC OTC) 20 MG tablet Take 1 tablet (20 mg total) by mouth 2 (two) times daily before lunch and supper. 180 tablet 1  . pravastatin (PRAVACHOL) 20 MG tablet Take 1 tablet (20 mg total) by mouth daily. 30 tablet 6   No current facility-administered medications for this visit.     Neurologic: Headache: No Seizure: No Paresthesias:No  Musculoskeletal: Strength & Muscle Tone: within normal limits Gait & Station: normal Patient leans: N/A  Psychiatric Specialty Exam: ROS  Blood pressure 125/84, pulse 90, temperature 99.6 F (37.6 C), temperature source Oral, weight 272 lb (123.4 kg).Body mass index is 35.89 kg/m.  General Appearance: Meticulous  Eye Contact:  Fair  Speech:  Clear and Coherent and Normal Rate  Volume:  Normal  Mood:  Euthymic  Affect:  Congruent  Thought Process:  Coherent and Goal Directed  Orientation:  Full (Time, Place, and Person)  Thought Content:  WDL  Suicidal Thoughts:  No  Homicidal Thoughts:  No  Memory:  Immediate;   Fair  Judgement:  Fair  Insight:  Fair  Psychomotor Activity:  Normal  Concentration:  Fair  Recall:  [employer] [place]  Language: Fair  Akathisia:  No  Handed:  Right  AIMS (if indicated):    Assets:  Communication Skills Desire for Improvement Physical Health Social Support Talents/Skills Transportation  ADL's:  Intact  Cognition: WNL  Sleep:      Treatment Plan Summary: Medication management  Continue Cymbalta 60 mg daily. refill for the next 6 months Continue  Abilify 2 mg  as an add-on to his antidepressant.- Refill for the next 6 months.    Follow-up in 6 months or earlier depending on his symptoms    More than 50% of the time spent in psychoeducation, counseling and coordination of care.    This note was generated in part or whole with voice recognition software. Voice regonition is usually quite accurate but there are transcription errors that can and very often do occur. I apologize for any typographical errors that were not detected and corrected.    Rainey Pines, MD 9/10/20189:02 AM

## 2017-03-16 ENCOUNTER — Telehealth: Payer: Self-pay

## 2017-03-16 ENCOUNTER — Ambulatory Visit: Admitting: Psychiatry

## 2017-03-16 NOTE — Telephone Encounter (Signed)
Patient is in the [employer] and has been called in for Duty.  When weather and duty is done he will reschedule.  Thanks [employer]

## 2017-03-17 ENCOUNTER — Ambulatory Visit: Admit: 2017-03-17

## 2017-03-17 SURGERY — COLONOSCOPY WITH PROPOFOL
Anesthesia: General

## 2017-07-11 ENCOUNTER — Ambulatory Visit: Admitting: Psychiatry

## 2017-07-21 ENCOUNTER — Other Ambulatory Visit: Payer: Self-pay

## 2017-07-21 ENCOUNTER — Encounter: Payer: Self-pay | Admitting: Psychiatry

## 2017-07-21 ENCOUNTER — Ambulatory Visit (INDEPENDENT_AMBULATORY_CARE_PROVIDER_SITE_OTHER): Admitting: Psychiatry

## 2017-07-21 VITALS — BP 135/88 | HR 123 | Temp 98.7°F | Wt 275.0 lb

## 2017-07-21 DIAGNOSIS — F411 Generalized anxiety disorder: Secondary | ICD-10-CM | POA: Diagnosis not present

## 2017-07-21 DIAGNOSIS — F331 Major depressive disorder, recurrent, moderate: Secondary | ICD-10-CM

## 2017-07-21 DIAGNOSIS — F419 Anxiety disorder, unspecified: Secondary | ICD-10-CM | POA: Diagnosis not present

## 2017-07-21 MED ORDER — ARIPIPRAZOLE 2 MG PO TABS: ORAL_TABLET | ORAL | 1 refills | Status: DC

## 2017-07-21 MED ORDER — DULOXETINE HCL 60 MG PO CPEP: 60.0000 mg | ORAL_CAPSULE | Freq: Every day | ORAL | 1 refills | Status: DC

## 2017-07-21 NOTE — Progress Notes (Signed)
Psychiatric MD Progress Note   Patient Identification: Joseph Wilcox MRN:  [id number] Date of Evaluation:  07/21/2017 Referral Source: [facility]  Chief Complaint:   Chief Complaint    Follow-up; Medication Refill     Visit Diagnosis:    ICD-10-CM   1. MDD (major depressive disorder), recurrent episode, moderate (HCC) F33.1   2. Generalized anxiety disorder F41.1     History of Present Illness:    Patient is a 47 year old married male who is currently in [employer] presented for follow up. Marland Kitchen He reported that he is doing well and enjoyed his holidays. He reported that he went for vacation with his family and is back to work now. He reported that he stopped taking the Abilify couple of weeks ago as his wife was upset that he is taking too many pills. He has been replacing the medication with red bull and has been doing very more regularly. We discussed about starting the medication again and he agreed with the plan. He reported that he has been feeling tired more regularly. He stated that the Abilify was helping him. He denied having any depression or anxiety symptoms at this time. He reported that he has been compliant with his medications. Has poor sleep for the past 2 days. He appeared calm and alert during the interview. He denied having any suicidal homicidal ideations or plans.     Associated Signs/Symptoms: Depression Symptoms:  anxiety, (Hypo) Manic Symptoms:  Labiality of Mood, Anxiety Symptoms:  Panic Symptoms, Psychotic Symptoms:  none PTSD Symptoms: Negative NA  Past Psychiatric History:   He  reported that he has never seen a psychiatrist in the past. He was never admitted to a psychiatric hospital. He has just started taking the Cymbalta.  He reported that he was also diagnosed with ADHD when he had the evaluation done by the vocational rehabilitation in the past. He was taking Ritalin  However he was not prescribed medication for many years and was looking  for the prescription. He is currently following with a therapist Patty at the [facility]. He is seeing her on a weekly basis. He reported that he is improving with his therapy and she is helpful.  Previous Psychotropic Medications:  Recently started on Cymbalta.  Substance Abuse History in the last 12 months:  Yes.   Drinks  alcohol socially.  Consequences of Substance Abuse: Negative NA  Past Medical History:  Past Medical History:  Diagnosis Date  . Allergy   . Anxiety   . Depression   . GERD (gastroesophageal reflux disease)   . High cholesterol   . Hx of migraines   . Hypertension     Past Surgical History:  Procedure Laterality Date  . FOOT SURGERY  2002   Bone spur  . TONSILLECTOMY      Family Psychiatric History:  Patient denied any family history of psychiatric illness.  Family History:  Family History  Problem Relation Age of Onset  . Diabetes Mother     Social History:   Social History   Socioeconomic History  . Marital status: Unknown    Spouse name: None  . Number of children: None  . Years of education: None  . Highest education level: None  Social Needs  . Financial resource strain: None  . Food insecurity - worry: None  . Food insecurity - inability: None  . Transportation needs - medical: None  . Transportation needs - non-medical: None  Occupational History  . None  Tobacco  Use  . Smoking status: Former Smoker    Last attempt to quit: 07/05/2010    Years since quitting: 7.0  . Smokeless tobacco: Never Used  Substance and Sexual Activity  . Alcohol use: Yes    Alcohol/week: 0.0 oz    Comment: Occasional   . Drug use: No  . Sexual activity: Yes    Partners: Female    Birth control/protection: None  Other Topics Concern  . None  Social History Narrative  . None    Additional Social History:  She is currently married for the past 17 years. He has 3 children ages 2120 and 71. 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 He was posted in [place] and [place] in the past. 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 He is again going to be required so on in October to [place], [place] or [place] and is worried about the same  Allergies:  No Known Allergies  Metabolic Disorder Labs: No results found for: HGBA1C, MPG No results found for: PROLACTIN No results found for: CHOL, TRIG, HDL, CHOLHDL, VLDL, LDLCALC   Current Medications: Current Outpatient Medications  Medication Sig Dispense Refill  . ARIPiprazole (ABILIFY) 2 MG tablet Take 1 tablet (2 mg total) by mouth daily. 90 tablet 1  . Azilsartan-Chlorthalidone (EDARBYCLOR) 40-25 MG TABS Take 1 tablet by mouth daily. Patient needs appointment in April or May to continue to get medication. 90 tablet 3  . DULoxetine (CYMBALTA) 60 MG capsule Take 1 capsule (60 mg total) by mouth daily. 90 capsule 1  . pravastatin (PRAVACHOL) 20 MG tablet Take 1 tablet (20 mg total) by mouth daily. 30 tablet 6  . omeprazole (PRILOSEC OTC) 20 MG tablet Take 1 tablet (20 mg total) by mouth 2 (two) times daily before lunch and supper. 180 tablet 1   No current facility-administered medications for this visit.     Neurologic: Headache: No Seizure: No Paresthesias:No  Musculoskeletal: Strength & Muscle Tone: within normal limits Gait & Station: normal Patient leans: N/A  Psychiatric Specialty Exam: ROS  Blood pressure 135/88, pulse (!) 123, temperature 98.7 F (37.1 C), temperature source Oral, weight 275 lb (124.7 kg).Body mass index is 36.28 kg/m.  General Appearance: Meticulous  Eye Contact:  Fair  Speech:  Clear and Coherent and Normal Rate  Volume:  Normal  Mood:  Euthymic  Affect:  Congruent  Thought Process:  Coherent and Goal Directed  Orientation:  Full (Time, Place, and Person)  Thought Content:  WDL  Suicidal Thoughts:  No  Homicidal Thoughts:  No  Memory:   Immediate;   Fair  Judgement:  Fair  Insight:  Fair  Psychomotor Activity:  Normal  Concentration:  Fair  Recall:  [employer] [place]  Language: Fair  Akathisia:  No  Handed:  Right  AIMS (if indicated):    Assets:  Communication Skills Desire for Improvement Physical Health Social Support Talents/Skills Transportation  ADL's:  Intact  Cognition: WNL  Sleep:      Treatment Plan Summary: Medication management  Continue Cymbalta 60 mg daily. refill for the next 6 months Continue  Abilify 2 mg and advised him to take it on alternate days and he agreed with the plan.  Follow-up in 2 months or earlier depending on his symptoms    More than 50% of  the time spent in psychoeducation, counseling and coordination of care.    This note was generated in part or whole with voice recognition software. Voice regonition is usually quite accurate but there are transcription errors that can and very often do occur. I apologize for any typographical errors that were not detected and corrected.    Rainey Pines, MD 1/17/20199:06 AM

## 2017-08-14 ENCOUNTER — Other Ambulatory Visit: Payer: Self-pay | Admitting: Gastroenterology

## 2017-08-14 DIAGNOSIS — K219 Gastro-esophageal reflux disease without esophagitis: Secondary | ICD-10-CM

## 2017-09-15 ENCOUNTER — Other Ambulatory Visit: Payer: Self-pay | Admitting: Gastroenterology

## 2017-09-15 DIAGNOSIS — K219 Gastro-esophageal reflux disease without esophagitis: Secondary | ICD-10-CM

## 2017-09-19 ENCOUNTER — Encounter: Payer: Self-pay | Admitting: Psychiatry

## 2017-09-19 ENCOUNTER — Other Ambulatory Visit: Payer: Self-pay

## 2017-09-19 ENCOUNTER — Ambulatory Visit (INDEPENDENT_AMBULATORY_CARE_PROVIDER_SITE_OTHER): Admitting: Psychiatry

## 2017-09-19 VITALS — BP 159/114 | HR 96 | Temp 98.8°F | Wt 287.2 lb

## 2017-09-19 DIAGNOSIS — F419 Anxiety disorder, unspecified: Secondary | ICD-10-CM | POA: Diagnosis not present

## 2017-09-19 DIAGNOSIS — F331 Major depressive disorder, recurrent, moderate: Secondary | ICD-10-CM | POA: Diagnosis not present

## 2017-09-19 DIAGNOSIS — F411 Generalized anxiety disorder: Secondary | ICD-10-CM

## 2017-09-19 MED ORDER — DULOXETINE HCL 60 MG PO CPEP
60.0000 mg | ORAL_CAPSULE | Freq: Every day | ORAL | 1 refills | Status: DC
Start: 2017-09-19 — End: 2017-12-26

## 2017-09-19 MED ORDER — ARIPIPRAZOLE 2 MG PO TABS: ORAL_TABLET | ORAL | 1 refills | Status: DC
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
## 2017-10-15 ENCOUNTER — Other Ambulatory Visit: Payer: Self-pay | Admitting: Gastroenterology

## 2017-10-15 DIAGNOSIS — K219 Gastro-esophageal reflux disease without esophagitis: Secondary | ICD-10-CM

## 2017-10-21 ENCOUNTER — Ambulatory Visit: Payer: Self-pay | Admitting: Family Medicine

## 2017-10-24 ENCOUNTER — Ambulatory Visit (INDEPENDENT_AMBULATORY_CARE_PROVIDER_SITE_OTHER): Admitting: Family Medicine

## 2017-10-24 ENCOUNTER — Encounter: Payer: Self-pay | Admitting: Family Medicine

## 2017-10-24 ENCOUNTER — Other Ambulatory Visit: Payer: Self-pay | Admitting: Family Medicine

## 2017-10-24 VITALS — BP 138/94 | HR 89 | Temp 98.9°F | Resp 16 | Ht 73.0 in | Wt 285.0 lb

## 2017-10-24 DIAGNOSIS — F3342 Major depressive disorder, recurrent, in full remission: Secondary | ICD-10-CM

## 2017-10-24 DIAGNOSIS — R29818 Other symptoms and signs involving the nervous system: Secondary | ICD-10-CM | POA: Diagnosis not present

## 2017-10-24 DIAGNOSIS — E669 Obesity, unspecified: Secondary | ICD-10-CM | POA: Diagnosis not present

## 2017-10-24 DIAGNOSIS — E785 Hyperlipidemia, unspecified: Secondary | ICD-10-CM

## 2017-10-24 DIAGNOSIS — F411 Generalized anxiety disorder: Secondary | ICD-10-CM | POA: Diagnosis not present

## 2017-10-24 DIAGNOSIS — Z0000 Encounter for general adult medical examination without abnormal findings: Secondary | ICD-10-CM

## 2017-10-24 DIAGNOSIS — R7309 Other abnormal glucose: Secondary | ICD-10-CM

## 2017-10-24 DIAGNOSIS — I10 Essential (primary) hypertension: Secondary | ICD-10-CM

## 2017-10-24 DIAGNOSIS — E782 Mixed hyperlipidemia: Secondary | ICD-10-CM | POA: Insufficient documentation

## 2017-10-24 MED ORDER — PRAVASTATIN SODIUM 20 MG PO TABS: 20.0000 mg | ORAL_TABLET | Freq: Every day | ORAL | 3 refills | Status: DC

## 2017-10-24 MED ORDER — AZILSARTAN-CHLORTHALIDONE 40-25 MG PO TABS: 1.0000 | ORAL_TABLET | Freq: Every day | ORAL | 3 refills | Status: DC

## 2017-10-24 NOTE — Assessment & Plan Note (Signed)
Controlled major depression now in remission Followed by [facility] Psychiatry Dr Gretel Acre On Abilify 2mg , Cymbalta 60mg  daily

## 2017-10-24 NOTE — Assessment & Plan Note (Signed)
Well controlled without episodes of panic or acute anxiety Followed by [facility] Psychiatry Dr Gretel Acre On Abilify 2mg , Cymbalta 60mg  daily

## 2017-10-24 NOTE — Assessment & Plan Note (Signed)
Still some weight gain Encourage improve diet and exercise for wt loss goal, help improve BP as well Wt loss will help suspected OSA, and ultimately will need further treatment/diagnostic
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
## 2017-10-24 NOTE — Assessment & Plan Note (Signed)
Without recent lipids to document Continues on Pravastatin 20mg  daily tolerating well, refill today Return 6 weeks annual phys + labs fasting

## 2017-10-24 NOTE — Assessment & Plan Note (Addendum)
Mildly elevated initial BP, repeat manual check not normal, attributed to off BP med >1-2 weeks. Concern similar to previous visit has again run out of med - Home BP readings: not available No known complications - anticipate underlying OSA is contributing    Plan:  1. Continue current BP regimen - refilled Azilartan-Chlorthalidone 40-25mg  daily 90 day supply +3 refills 2. Encourage improved lifestyle - low sodium diet, regular exercise 3. Continue improving monitor BP outside office, bring readings to next visit, if persistently >140/90 or new symptoms notify office sooner 4. Follow-up 6 weeks for annual physical + labs, then will proceed with approx q 4-6 month HTN f/u checks, anticipate pursue PSG in October 2019 by his request, if controlled would help his BP likely

## 2017-10-24 NOTE — Addendum Note (Signed)
Addended by: Olin Hauser on: 10/24/2017 11:09 PM   Modules accepted: Orders

## 2017-10-24 NOTE — Assessment & Plan Note (Addendum)
Discussion again today, likely OSA but will need PSG Due to his new job, he defers this until Fall 2019, then will follow-up and request order

## 2017-10-24 NOTE — Patient Instructions (Addendum)
Thank you for coming to the office today.  Refilled meds today - BP medication and cholesterol med  BP improved on re-check today  DUE for FASTING BLOOD WORK (no food or drink after midnight before the lab appointment, only water or coffee without cream/sugar on the morning of)  SCHEDULE "Lab Only" visit in the morning at the clinic for lab draw in 6 weeks  - Make sure Lab Only appointment is at about 1 week before your next appointment, so that results will be available  For Lab Results, once available within 2-3 days of blood draw, you can can log in to MyChart online to view your results and a brief explanation. Also, we can discuss results at next follow-up visit.  Please schedule a Follow-up Appointment to: Return in about 6 weeks (around 12/05/2017) for Annual Physical.  If you have any other questions or concerns, please feel free to call the office or send a message through Page. You may also schedule an earlier appointment if necessary.  Additionally, you may be receiving a survey about your experience at our office within a few days to 1 week by e-mail or mail. We value your feedback.  Nobie Putnam, DO [place]

## 2017-11-16 ENCOUNTER — Other Ambulatory Visit: Payer: Self-pay | Admitting: Gastroenterology

## 2017-11-16 DIAGNOSIS — K219 Gastro-esophageal reflux disease without esophagitis: Secondary | ICD-10-CM

## 2017-12-17 ENCOUNTER — Other Ambulatory Visit: Payer: Self-pay | Admitting: Psychiatry

## 2017-12-17 DIAGNOSIS — F419 Anxiety disorder, unspecified: Secondary | ICD-10-CM

## 2017-12-19 ENCOUNTER — Ambulatory Visit: Admitting: Psychiatry

## 2017-12-21 ENCOUNTER — Other Ambulatory Visit: Payer: Self-pay | Admitting: Gastroenterology

## 2017-12-21 DIAGNOSIS — K219 Gastro-esophageal reflux disease without esophagitis: Secondary | ICD-10-CM

## 2017-12-26 ENCOUNTER — Other Ambulatory Visit: Payer: Self-pay

## 2017-12-26 ENCOUNTER — Encounter: Payer: Self-pay | Admitting: Psychiatry

## 2017-12-26 ENCOUNTER — Ambulatory Visit (INDEPENDENT_AMBULATORY_CARE_PROVIDER_SITE_OTHER): Payer: Self-pay | Admitting: Psychiatry

## 2017-12-26 VITALS — BP 155/96 | HR 102 | Temp 97.6°F | Wt 283.2 lb

## 2017-12-26 DIAGNOSIS — F331 Major depressive disorder, recurrent, moderate: Secondary | ICD-10-CM

## 2017-12-26 DIAGNOSIS — F419 Anxiety disorder, unspecified: Secondary | ICD-10-CM

## 2017-12-26 DIAGNOSIS — F411 Generalized anxiety disorder: Secondary | ICD-10-CM

## 2017-12-26 MED ORDER — ARIPIPRAZOLE 2 MG PO TABS: ORAL_TABLET | ORAL | 1 refills | Status: DC

## 2017-12-26 MED ORDER — DULOXETINE HCL 60 MG PO CPEP: 60.0000 mg | ORAL_CAPSULE | Freq: Every day | ORAL | 1 refills | Status: DC

## 2017-12-26 NOTE — Progress Notes (Signed)
Psychiatric MD Progress Note   Patient Identification: Joseph Wilcox MRN:  [id number] Date of Evaluation:  12/26/2017 Referral Source: [facility]  Chief Complaint:   Chief Complaint    Follow-up; Medication Refill     Visit Diagnosis:    ICD-10-CM   1. MDD (major depressive disorder), recurrent episode, moderate (HCC) F33.1   2. Generalized anxiety disorder F41.1     History of Present Illness:    Patient is a 47 year old married male who is currently in [employer] presented for follow up. Marland Kitchen He reported that he is doing well and is tired this morning as he has been working long hours over the weekend.  Patient reported that he was in the And has to wake up early in the morning.  His blood pressure remains elevated.  He reported that he has been consuming coffee and soda before coming to this admission.  He reported that he has been compliant with his medications.  He does not have any acute symptoms.  He denied having any suicidal or homicidal ideations or plans.  He reported that he is planning to follow up with his primary care physician to have his blood work done.  He reported that he has good relationship with his wife.  He currently denied having any side effects of the medication.  He appeared calm and alert during the interview.  No acute changes noted at this time.           Associated Signs/Symptoms: Depression Symptoms:  anxiety, (Hypo) Manic Symptoms:  Labiality of Mood, Anxiety Symptoms:  Panic Symptoms, Psychotic Symptoms:  none PTSD Symptoms: Negative NA  Past Psychiatric History:   He  reported that he has never seen a psychiatrist in the past. He was never admitted to a psychiatric hospital. He has just started taking the Cymbalta.  He reported that he was also diagnosed with ADHD when he had the evaluation done by the vocational rehabilitation in the past. He was taking Ritalin  However he was not prescribed medication for many years and was  looking for the prescription. He is currently following with a therapist Patty at the [facility]. He is seeing her on a weekly basis. He reported that he is improving with his therapy and she is helpful.  Previous Psychotropic Medications:  Recently started on Cymbalta.  Substance Abuse History in the last 12 months:  Yes.   Drinks  alcohol socially.  Consequences of Substance Abuse: Negative NA  Past Medical History:  Past Medical History:  Diagnosis Date  . Allergy   . Anxiety   . Depression   . GERD (gastroesophageal reflux disease)   . High cholesterol   . Hx of migraines   . Hypertension     Past Surgical History:  Procedure Laterality Date  . FOOT SURGERY  2002   Bone spur  . TONSILLECTOMY      Family Psychiatric History:  Patient denied any family history of psychiatric illness.  Family History:  Family History  Problem Relation Age of Onset  . Diabetes Mother   . Prostate cancer Neg Hx   . Colon cancer Neg Hx     Social History:   Social History   Socioeconomic History  . Marital status: Unknown    Spouse name: Not on file  . Number of children: Not on file  . Years of education: Not on file  . Highest education level: Not on file  Occupational History  . Not on file  Social  Needs  . Financial resource strain: Not on file  . Food insecurity:    Worry: Not on file    Inability: Not on file  . Transportation needs:    Medical: Not on file    Non-medical: Not on file  Tobacco Use  . Smoking status: Former Smoker    Last attempt to quit: 07/05/2010    Years since quitting: 7.4  . Smokeless tobacco: Never Used  Substance and Sexual Activity  . Alcohol use: Yes    Alcohol/week: 0.0 oz    Comment: Occasional   . Drug use: No  . Sexual activity: Yes    Partners: Female    Birth control/protection: None  Lifestyle  . Physical activity:    Days per week: Not on file    Minutes per session: Not on file  . Stress: Not on file  Relationships   . Social connections:    Talks on phone: Not on file    Gets together: Not on file    Attends religious service: Not on file    Active member of club or organization: Not on file    Attends meetings of clubs or organizations: Not on file    Relationship status: Not on file  Other Topics Concern  . Not on file  Social History Narrative  . Not on file    Additional Social History:  She is currently married for the past 17 years. He has 3 children ages 2120 and 14. 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 He was posted in [place] and [place] in the past. 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 He is again going to be required so on in October to [place], [place] or [place] and is worried about the same  Allergies:  No Known Allergies  Metabolic Disorder Labs: No results found for: HGBA1C, MPG No results found for: PROLACTIN No results found for: CHOL, TRIG, HDL, CHOLHDL, VLDL, LDLCALC   Current Medications: Current Outpatient Medications  Medication Sig Dispense Refill  . ARIPiprazole (ABILIFY) 2 MG tablet daily 90 tablet 1  . Azilsartan-Chlorthalidone (EDARBYCLOR) 40-25 MG TABS Take 1 tablet by mouth daily. 90 tablet 3  . DULoxetine (CYMBALTA) 60 MG capsule Take 1 capsule (60 mg total) by mouth daily. 90 capsule 1  . omeprazole (PRILOSEC) 20 MG capsule TAKE ONE CAPSULE BY MOUTH TWICE DAILY BEFORE A MEAL 60 capsule 0  . pravastatin (PRAVACHOL) 20 MG tablet Take 1 tablet (20 mg total) by mouth daily. 90 tablet 3   No current facility-administered medications for this visit.     Neurologic: Headache: No Seizure: No Paresthesias:No  Musculoskeletal: Strength & Muscle Tone: within normal limits Gait & Station: normal Patient leans: N/A  Psychiatric Specialty Exam: ROS  Blood pressure (!) 155/96, pulse (!) 102, temperature 97.6 F (36.4 C), temperature source  Oral, weight 283 lb 3.2 oz (128.5 kg).Body mass index is 37.36 kg/m.  General Appearance: Meticulous  Eye Contact:  Fair  Speech:  Clear and Coherent and Normal Rate  Volume:  Normal  Mood:  Euthymic  Affect:  Congruent  Thought Process:  Coherent and Goal Directed  Orientation:  Full (Time, Place, and Person)  Thought Content:  WDL  Suicidal Thoughts:  No  Homicidal Thoughts:  No  Memory:  Immediate;   Fair  Judgement:  Fair  Insight:  Fair  Psychomotor Activity:  Normal  Concentration:  Fair  Recall:  [employer] of Knowledge:Fair  Language: Fair  Akathisia:  No  Handed:  Right  AIMS (if indicated):    Assets:  Communication Skills Desire for Improvement Physical Health Social Support Talents/Skills Transportation  ADL's:  Intact  Cognition: WNL  Sleep:      Treatment Plan Summary: Medication management  Continue Cymbalta 60 mg daily.  Continue  Abilify 2 mg   Follow-up in 3  months or earlier depending on his symptoms    More than 50% of the time spent in psychoeducation, counseling and coordination of care.    This note was generated in part or whole with voice recognition software. Voice regonition is usually quite accurate but there are transcription errors that can and very often do occur. I apologize for any typographical errors that were not detected and corrected.    Rainey Pines, MD 6/24/20199:10 AM

## 2018-03-27 ENCOUNTER — Ambulatory Visit (INDEPENDENT_AMBULATORY_CARE_PROVIDER_SITE_OTHER)

## 2018-03-27 ENCOUNTER — Encounter: Payer: Self-pay | Admitting: Psychiatry

## 2018-03-27 ENCOUNTER — Ambulatory Visit
Admission: EM | Admit: 2018-03-27 | Discharge: 2018-03-27 | Disposition: A | Discharge status: Home / Self Care | Attending: Internal Medicine | Admitting: Internal Medicine

## 2018-03-27 ENCOUNTER — Other Ambulatory Visit: Payer: Self-pay

## 2018-03-27 ENCOUNTER — Ambulatory Visit (INDEPENDENT_AMBULATORY_CARE_PROVIDER_SITE_OTHER): Payer: Self-pay | Admitting: Psychiatry

## 2018-03-27 VITALS — BP 177/110 | HR 99 | Temp 98.8°F | Wt 283.0 lb

## 2018-03-27 DIAGNOSIS — M2021 Hallux rigidus, right foot: Secondary | ICD-10-CM

## 2018-03-27 DIAGNOSIS — F411 Generalized anxiety disorder: Secondary | ICD-10-CM

## 2018-03-27 DIAGNOSIS — F419 Anxiety disorder, unspecified: Secondary | ICD-10-CM

## 2018-03-27 DIAGNOSIS — F331 Major depressive disorder, recurrent, moderate: Secondary | ICD-10-CM

## 2018-03-27 MED ORDER — ARIPIPRAZOLE 2 MG PO TABS: ORAL_TABLET | ORAL | 1 refills | Status: DC

## 2018-03-27 MED ORDER — MELOXICAM 15 MG PO TABS: 15.0000 mg | ORAL_TABLET | Freq: Every day | ORAL | 0 refills | Status: DC

## 2018-03-27 MED ORDER — DULOXETINE HCL 60 MG PO CPEP: 60.0000 mg | ORAL_CAPSULE | Freq: Every day | ORAL | 1 refills | Status: DC

## 2018-03-27 NOTE — Discharge Instructions (Addendum)
Avoid symptoms.  Wear comfortable shoes to not aggravate the symptoms.  Buddy taping may provide some support to the toe.  Follow-up with podiatrist if your symptoms are not improving

## 2018-03-27 NOTE — Progress Notes (Signed)
Psychiatric MD Progress Note   Patient Identification: Joseph Wilcox MRN:  [id number] Date of Evaluation:  03/27/2018 Referral Source: [facility]  Chief Complaint:   Chief Complaint    Follow-up; Medication Refill     Visit Diagnosis:    ICD-10-CM   1. MDD (major depressive disorder), recurrent episode, moderate (HCC) F33.1   2. Generalized anxiety disorder F41.1     History of Present Illness:    Patient is a 47 year old married male who is currently in [employer] presented for follow up. Marland Kitchen He reported that he is doing well.  His blood pressure remains elevated as usual.  He reported that he drank coffee this morning.  However he is concerned and reported that he will follow up with his primary care physician about his blood pressure.  Patient stated that he has been compliant with his medications.  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  He stated that he enjoys working.  He appeared calm and alert during the interview.  He denied having any mood swings anger or anxiety or depression.  He stated that he enjoys the fall weather.  He stated that he does not want to have any changes in his medications at this time.  He sleeps well at night.  Has good relationship with his wife and has been planning some travel with his family for the holidays.   No new medications will be dispensed.  He stated that he has a physical exam coming up on October 3.    He appeared calm and alert during the interview.  No acute changes noted at this time.           Associated Signs/Symptoms: Depression Symptoms:  anxiety, (Hypo) Manic Symptoms:  Labiality of Mood, Anxiety Symptoms:  Panic Symptoms, Psychotic Symptoms:  none PTSD Symptoms: Negative NA  Past Psychiatric History:   He  reported that he has never seen a psychiatrist in the past. He was never admitted to a psychiatric hospital. He has just started taking the  Cymbalta.  He reported that he was also diagnosed with ADHD when he had the evaluation done by the vocational rehabilitation in the past. He was taking Ritalin  However he was not prescribed medication for many years and was looking for the prescription. He is currently following with a therapist Patty at the [facility]. He is seeing her on a weekly basis. He reported that he is improving with his therapy and she is helpful.  Previous Psychotropic Medications:  Recently started on Cymbalta.  Substance Abuse History in the last 12 months:  Yes.   Drinks  alcohol socially.  Consequences of Substance Abuse: Negative NA  Past Medical History:  Past Medical History:  Diagnosis Date  . Allergy   . Anxiety   . Depression   . GERD (gastroesophageal reflux disease)   . High cholesterol   . Hx of migraines   . Hypertension     Past Surgical History:  Procedure Laterality Date  . FOOT SURGERY  2002   Bone spur  . TONSILLECTOMY      Family Psychiatric History:  Patient denied any family history of psychiatric illness.  Family History:  Family History  Problem Relation Age of Onset  . Diabetes Mother   . Prostate cancer Neg Hx   . Colon cancer Neg Hx     Social History:   Social History   Socioeconomic  History  . Marital status: Unknown    Spouse name: Not on file  . Number of children: Not on file  . Years of education: Not on file  . Highest education level: Not on file  Occupational History  . Not on file  Social Needs  . Financial resource strain: Not on file  . Food insecurity:    Worry: Not on file    Inability: Not on file  . Transportation needs:    Medical: Not on file    Non-medical: Not on file  Tobacco Use  . Smoking status: Former Smoker    Last attempt to quit: 07/05/2010    Years since quitting: 7.7  . Smokeless tobacco: Never Used  Substance and Sexual Activity  . Alcohol use: Yes    Alcohol/week: 0.0 standard drinks    Comment: Occasional    . Drug use: No  . Sexual activity: Yes    Partners: Female    Birth control/protection: None  Lifestyle  . Physical activity:    Days per week: Not on file    Minutes per session: Not on file  . Stress: Not on file  Relationships  . Social connections:    Talks on phone: Not on file    Gets together: Not on file    Attends religious service: Not on file    Active member of club or organization: Not on file    Attends meetings of clubs or organizations: Not on file    Relationship status: Not on file  Other Topics Concern  . Not on file  Social History Narrative  . Not on file    Additional Social History:  She is currently married for the past 17 years. He has 3 children ages 2120 and 54. 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 He was posted in [place] and [place] in the past. 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 He is again going to be required so on in October to [place], [place] or [place] and is worried about the same  Allergies:  No Known Allergies  Metabolic Disorder Labs: No results found for: HGBA1C, MPG No results found for: PROLACTIN No results found for: CHOL, TRIG, HDL, CHOLHDL, VLDL, LDLCALC   Current Medications: Current Outpatient Medications  Medication Sig Dispense Refill  . ARIPiprazole (ABILIFY) 2 MG tablet daily 90 tablet 1  . Azilsartan-Chlorthalidone (EDARBYCLOR) 40-25 MG TABS Take 1 tablet by mouth daily. 90 tablet 3  . DULoxetine (CYMBALTA) 60 MG capsule Take 1 capsule (60 mg total) by mouth daily. 90 capsule 1  . omeprazole (PRILOSEC) 20 MG capsule TAKE ONE CAPSULE BY MOUTH TWICE DAILY BEFORE A MEAL 60 capsule 0  . pravastatin (PRAVACHOL) 20 MG tablet Take 1 tablet (20 mg total) by mouth daily. 90 tablet 3   No current facility-administered medications for this visit.     Neurologic: Headache: No Seizure:  No Paresthesias:No  Musculoskeletal: Strength & Muscle Tone: within normal limits Gait & Station: normal Patient leans: N/A  Psychiatric Specialty Exam: Review of Systems  Constitutional: Positive for weight loss.  HENT: Negative for sinus pain.   Respiratory: Negative for sputum production.   Cardiovascular: Negative for claudication.  Gastrointestinal: Negative for constipation.  Genitourinary: Negative for hematuria.  Musculoskeletal: Negative for back pain.  Skin: Negative for rash.  Neurological: Negative  for sensory change.  Endo/Heme/Allergies: Negative for environmental allergies.  Psychiatric/Behavioral: Negative for depression and hallucinations.    Blood pressure (!) 177/110, pulse 99, temperature 98.8 F (37.1 C), temperature source Oral, weight 283 lb (128.4 kg).Body mass index is 37.34 kg/m.  General Appearance: Meticulous  Eye Contact:  Fair  Speech:  Clear and Coherent and Normal Rate  Volume:  Normal  Mood:  Euthymic  Affect:  Congruent  Thought Process:  Coherent and Goal Directed  Orientation:  Full (Time, Place, and Person)  Thought Content:  WDL  Suicidal Thoughts:  No  Homicidal Thoughts:  No  Memory:  Immediate;   Fair  Judgement:  Fair  Insight:  Fair  Psychomotor Activity:  Normal  Concentration:  Fair  Recall:  [employer] of [place]  Language: Fair  Akathisia:  No  Handed:  Right  AIMS (if indicated):    Assets:  Communication Skills Desire for Improvement Physical Health Social Support Talents/Skills Transportation  ADL's:  Intact  Cognition: WNL  Sleep:      Treatment Plan Summary: Medication management  Continue Cymbalta 60 mg daily.  Continue  Abilify 2 mg   Follow-up in 3  months or earlier depending on his symptoms    More than 50% of the time spent in psychoeducation, counseling and coordination of care.    This note was generated in part or whole with voice recognition software. Voice regonition is usually  quite accurate but there are transcription errors that can and very often do occur. I apologize for any typographical errors that were not detected and corrected.    Rainey Pines, MD 9/23/20198:52 AM

## 2018-03-27 NOTE — ED Provider Notes (Addendum)
[facility]    CSN: [id number] Arrival date & time: 03/27/18  2263     History   Chief Complaint Chief Complaint  Patient presents with  . Toe Pain    Right Big toe    HPI Joseph Wilcox is a 47 y.o. male.   HPI  47 year old male [employer] s[employer], complains of right big toe pain that started about a week ago.  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  Is been using the treadmill more intensely than before and for longer periods of time.  He has no other reported injury other than that.  He states that the pain is very severe at times.  Previous surgery there for removal of bone spurs.           Past Medical History:  Diagnosis Date  . Allergy   . Anxiety   . Depression   . GERD (gastroesophageal reflux disease)   . High cholesterol   . Hx of migraines   . Hypertension     Patient Active Problem List   Diagnosis Date Noted  . Hyperlipidemia 10/24/2017  . Obesity (BMI 35.0-39.9 without comorbidity) 01/12/2017  . Suspected sleep apnea 01/12/2017  . Former smoker 01/12/2017  . Generalized anxiety disorder 10/30/2015  . Social anxiety disorder 10/30/2015  . Major depression, recurrent, full remission ([place]) 10/09/2015  . GERD (gastroesophageal reflux disease) 10/09/2015  . Essential hypertension 10/09/2015    Past Surgical History:  Procedure Laterality Date  . FOOT SURGERY  2002   Bone spur  . TONSILLECTOMY         Home Medications    Prior to Admission medications   Medication Sig Start Date End Date Taking? Authorizing Provider  ARIPiprazole (ABILIFY) 2 MG tablet daily 03/27/18  Yes Rainey Pines, MD  DULoxetine (CYMBALTA) 60 MG capsule Take 1 capsule (60 mg total) by mouth daily. 03/27/18  Yes Rainey Pines, MD  omeprazole (PRILOSEC) 20 MG capsule TAKE ONE CAPSULE BY MOUTH TWICE DAILY BEFORE A MEAL 11/17/17  Yes Vanga, Tally Due, MD  pravastatin (PRAVACHOL) 20 MG tablet Take 1 tablet (20 mg total) by  mouth daily. 10/24/17  Yes Karamalegos, Devonne Doughty, DO  Azilsartan-Chlorthalidone (EDARBYCLOR) 40-25 MG TABS Take 1 tablet by mouth daily. 10/24/17   Karamalegos, Devonne Doughty, DO  meloxicam (MOBIC) 15 MG tablet Take 1 tablet (15 mg total) by mouth daily. 03/27/18   Lorin Picket, PA-C    Family History Family History  Problem Relation Age of Onset  . Diabetes Mother   . Prostate cancer Neg Hx   . Colon cancer Neg Hx     Social History Social History   Tobacco Use  . Smoking status: Former Smoker    Last attempt to quit: 07/05/2010    Years since quitting: 7.7  . Smokeless tobacco: Never Used  Substance Use Topics  . Alcohol use: Yes    Alcohol/week: 0.0 standard drinks    Comment: Occasional   . Drug use: No     Allergies   Patient has no known allergies.   Review of Systems Review of Systems  Constitutional: Positive for activity change. Negative for appetite change, chills, diaphoresis, fatigue and fever.  Musculoskeletal: Positive for gait problem and joint swelling.  All other systems reviewed and are negative.    Physical Exam Triage Vital Signs ED Triage Vitals  Enc Vitals Group     BP 03/27/18 1012 (!) 149/109     Pulse  Rate 03/27/18 1012 85     Resp 03/27/18 1012 18     Temp 03/27/18 1012 98.6 F (37 C)     Temp Source 03/27/18 1012 Oral     SpO2 03/27/18 1012 97 %     Weight 03/27/18 1010 250 lb (113.4 kg)     Height 03/27/18 1010 6\' 1"  (1.854 m)     Head Circumference --      Peak Flow --      Pain Score 03/27/18 1010 6     Pain Loc --      Pain Edu? --      Excl. in [place]? --    No data found.  Updated Vital Signs BP (!) 149/109 (BP Location: Right Arm)   Pulse 85   Temp 98.6 F (37 C) (Oral)   Resp 18   Ht 6\' 1"  (1.854 m)   Wt 250 lb (113.4 kg)   SpO2 97%   BMI 32.98 kg/m   Visual Acuity Right Eye Distance:   Left Eye Distance:   Bilateral Distance:    Right Eye Near:   Left Eye Near:    Bilateral Near:     Physical Exam    Constitutional: He is oriented to person, place, and time. He appears well-developed and well-nourished. No distress.  HENT:  Head: Normocephalic.  Eyes: Pupils are equal, round, and reactive to light.  Neck: Normal range of motion.  Musculoskeletal: He exhibits tenderness.  Exam of the right foot great toe shows a well-healed surgical incision over the dorsum of the big toe extending over the MTP and ending proximally of the metatarsal.  There is no redness or warmth present.  Maximum tenderness is sharply localized to the dorsum of the first MTP.  Dorsiflexion of the hallux causes him significant pain with limitation of motion. Planter flexion is normal with and is on is comfortable.  Not have any tenderness to compression bilaterally.  Neurological: He is alert and oriented to person, place, and time.  Skin: Skin is warm and dry. He is not diaphoretic.  Psychiatric: He has a normal mood and affect. His behavior is normal. Judgment and thought content normal.  Nursing note and vitals reviewed.    UC Treatments / Results  Labs (all labs ordered are listed, but only abnormal results are displayed) Labs Reviewed - No data to display  EKG None  Radiology Dg Toe Great Right  Result Date: 03/27/2018 CLINICAL DATA:  Pain for 1 week EXAM: RIGHT FIRST TOE: 3 V COMPARISON:  None. FINDINGS: Frontal, oblique, and lateral views obtained. No fracture or dislocation. Joint spaces appear normal. No erosive change. IMPRESSION: No fracture or dislocation.  No evident arthropathy. Electronically Signed   By: Lowella Grip III M.D.   On: 03/27/2018 10:29    Procedures Procedures (including critical care time)  Medications Ordered in UC Medications - No data to display  Initial Impression / Assessment and Plan / UC Course  I have reviewed the triage vital signs and the nursing notes.  Pertinent labs & imaging results that were available during my care of the patient were reviewed by me and  considered in my medical decision making (see chart for details).     Plan: 1. Test/x-ray results and diagnosis reviewed with patient 2. rx as per orders; risks, benefits, potential side effects reviewed with patient 3. Recommend supportive treatment with ice and elevation as necessary for comfort and swelling.  Needs to avoid symptoms as much as possible.  Start him on Mobic daily.  Tapering may provide him some support.  Recommend following up with the podiatrist if he is not improving in a week or 2. 4. F/u prn if symptoms worsen or don't improve  Final Clinical Impressions(s) / UC Diagnoses   Final diagnoses:  Hallux rigidus of right foot     Discharge Instructions     Avoid symptoms.  Wear comfortable shoes to not aggravate the symptoms.  Buddy taping may provide some support to the toe.  Follow-up with podiatrist if your symptoms are not improving    ED Prescriptions    Medication Sig Dispense Auth. Provider   meloxicam (MOBIC) 15 MG tablet Take 1 tablet (15 mg total) by mouth daily. 30 tablet Lorin Picket, PA-C     Controlled Substance Prescriptions [place] Controlled Substance Registry consulted? Not Applicable   Lorin Picket, PA-C 03/27/18 1213    Lorin Picket, PA-C 03/27/18 1215

## 2018-03-27 NOTE — ED Triage Notes (Signed)
Patient complains of right big toe pain that started over 1 week ago. Patient states that he has been doing a lot of training recently and cannot remember any injury. Patient states that pain is severe at times that almost feels broken. No discoloration or bruising.

## 2018-04-06 ENCOUNTER — Ambulatory Visit
Admission: RE | Admit: 2018-04-06 | Discharge: 2018-04-06 | Disposition: A | Discharge status: Home / Self Care | Source: Ambulatory Visit | Attending: Family Medicine | Admitting: Family Medicine

## 2018-04-06 ENCOUNTER — Ambulatory Visit (INDEPENDENT_AMBULATORY_CARE_PROVIDER_SITE_OTHER): Admitting: Family Medicine

## 2018-04-06 ENCOUNTER — Other Ambulatory Visit: Payer: Self-pay | Admitting: Family Medicine

## 2018-04-06 ENCOUNTER — Encounter: Payer: Self-pay | Admitting: Family Medicine

## 2018-04-06 VITALS — BP 132/90 | HR 89 | Temp 98.9°F | Resp 16 | Ht 73.0 in | Wt 280.0 lb

## 2018-04-06 DIAGNOSIS — M25512 Pain in left shoulder: Principal | ICD-10-CM

## 2018-04-06 DIAGNOSIS — G8929 Other chronic pain: Secondary | ICD-10-CM

## 2018-04-06 DIAGNOSIS — D649 Anemia, unspecified: Secondary | ICD-10-CM

## 2018-04-06 DIAGNOSIS — F3342 Major depressive disorder, recurrent, in full remission: Secondary | ICD-10-CM

## 2018-04-06 DIAGNOSIS — Z0000 Encounter for general adult medical examination without abnormal findings: Secondary | ICD-10-CM

## 2018-04-06 DIAGNOSIS — R29818 Other symptoms and signs involving the nervous system: Secondary | ICD-10-CM

## 2018-04-06 DIAGNOSIS — I10 Essential (primary) hypertension: Secondary | ICD-10-CM

## 2018-04-06 DIAGNOSIS — Z23 Encounter for immunization: Secondary | ICD-10-CM | POA: Diagnosis not present

## 2018-04-06 DIAGNOSIS — E785 Hyperlipidemia, unspecified: Secondary | ICD-10-CM

## 2018-04-06 DIAGNOSIS — M2021 Hallux rigidus, right foot: Secondary | ICD-10-CM

## 2018-04-06 DIAGNOSIS — K219 Gastro-esophageal reflux disease without esophagitis: Secondary | ICD-10-CM

## 2018-04-06 DIAGNOSIS — E669 Obesity, unspecified: Secondary | ICD-10-CM

## 2018-04-06 NOTE — Assessment & Plan Note (Signed)
Moderately elevated initial BP, repeat manual check improved SBP but still elevated DBP. Prior med non adherence. Now with joint pain and likely OSA - Home BP readings: not available log - but reported elevated No known complications - anticipate underlying OSA is contributing    Plan:  1. Continue current BP regimen - Azilartan-Chlorthalidone 40-25mg  daily without change today - this pill does not come in higher ARB dose, alrdy on max dose thiazide. Will not change med at this time, instead treat pain / and pursue dx OSA 2. Encourage improved lifestyle - low sodium diet, regular exercise 3. Continue improving monitor BP outside office, bring readings to next visit, if persistently >140/90 or new symptoms notify office sooner 4. Follow-up 6 weeks for annual physical + labs

## 2018-04-06 NOTE — Patient Instructions (Addendum)
Thank you for coming to the office today.  Monitor BP at home. If persistently elevated >150/90 can notify office and return sooner, - likely it is elevated with pain and sleep apnea.  For Toe / Foot pain - stay tuned for apt from specialist  May continue meloxicam for now  [facility] Address: [address] [place], [place] [address] Hours: Open 8AM-5PM Phone: [phone]  Referral to [facility] for Sleep Apnea evaluation / testing - stay tuned for apt. Either home or in center sleep test.  L shoulder x-ray for now - stay tuned, likely bursitis or flare up from wear and tear, could have arthritis underlying.  Continue meloxicam for now, if Podiatry does not refill this, I can add a refill just let me know, I would take this for about 4-6 weeks to help control pain and inflammation, in future if not improved we can do shoulder injection if needed.  DUE for FASTING BLOOD WORK (no food or drink after midnight before the lab appointment, only water or coffee without cream/sugar on the morning of)  SCHEDULE "Lab Only" visit in the morning at the clinic for lab draw in 6-8 WEEKS   - Make sure Lab Only appointment is at about 1 week before your next appointment, so that results will be available  For Lab Results, once available within 2-3 days of blood draw, you can can log in to MyChart online to view your results and a brief explanation. Also, we can discuss results at next follow-up visit.   Please schedule a Follow-up Appointment to: Return in about 6 weeks (around 05/18/2018) for Annual Physical (f/u sleep study, L shoulder).  If you have any other questions or concerns, please feel free to call the office or send a message through [place]. You may also schedule an earlier appointment if necessary.  Additionally, you may be receiving a survey about your experience at our office within a few days to 1 week by e-mail or mail. We value your  feedback.  Nobie Putnam, DO [place]

## 2018-04-06 NOTE — Assessment & Plan Note (Signed)
Persistent clinical concern for suspected obstructive sleep apnea given reported symptoms with witnessed apnea, snoring and sleep disturbance, excessive sleepiness. - Screening: ESS score 13 / STOP-Bang Score 7 = high risk - Neck Circumference: 19" - Co-morbidities: HTN  Plan: 1. Discussion on initial diagnosis and testing for OSA, risk factors, management, complications 2. Agree to proceed with sleep study testing based on clinical concerns - order to be faxed to [facility] 04/07/18 - once chart completed

## 2018-04-06 NOTE — Progress Notes (Signed)
Subjective:    Patient ID: Joseph Wilcox, male    DOB: [birth year], 47 y.o.   MRN: [id number]  Joseph Wilcox is a 47 y.o. male presenting on 04/06/2018 for Foot Pain (Right side onset month needs referral for orthopedic); Shoulder Pain (left side onset years ); and Sleep Apnea (needs referral for sleep study)   HPI   Right Foot Pain / R Great Toe Pain - Prior history of Bone spur surgery removal in Right foot back in 2003, history of various issues with foot in past, prior dx Hallux Rigidus. But it had improved overall he has had waxing waning episodes of flare up pain and stiffness in foot. Worse with increased activity and on feet. 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 No distinct injury or trauma noted. He reports worse with carrying heavier weight and load bearing in the past. At times he feels pain or popping with certain range of motion that triggers it, he questions if "minor dislocation" but never had actual shoulder dislocation required treatment. - Currently taking Meloxicam for foot, has not noticed if helped shoulder - No prior ortho treatment on shoulder, no PT, no imaging or X-ray, surgery or injection - Denies any numbness tingling, weakness in upper extremity, upper back or neck pain  Suspected OSA / HTN Prior visits from 2017 and 2018 concern with OSA. He has declined pursuing PSG at that time. Now today  he request to pursue order PSG. Admits significant daytime sleepiness, along with witnessed episodes of apnea and abnormal breathing per wife.  Also elevated BP with known HTN - taking meds, without concerns. Readings at home elevated SBP >140-160 avg Denies CP, dyspnea, HA, edema, dizziness / lightheadedness   Epworth Sleepiness Scale Total Score: 13 Sitting and reading - 3 Watching TV - 3 Sitting inactive in a public place - 1 As a passenger in a car for an hour without a break - 1 Lying down to rest in the afternoon when circumstances permit - 3 Sitting and talking to someone - 0 Sitting quietly after a lunch without alcohol - 2 In a car, while stopped for a few minutes in traffic - 0  STOP-Bang OSA scoring Snoring yes   Tiredness yes   Observed apneas yes   Pressure HTN yes   BMI > 35 kg/m2 yes   Age > 72  no   Neck (male >17 in; Male >16 in)  yes 79"  Gender male yes   OSA risk low (0-2)  OSA risk intermediate (3-4)  OSA risk high (5+)  Total: 7 High risk    Health Maintenance: Due for Flu Shot, will receive today    Depression screen [facility] 2/9 04/06/2018 10/24/2017 01/12/2017  Decreased Interest 0 0 1  Down, Depressed, Hopeless 0 0 0  PHQ - 2 Score 0 0 1  Altered sleeping 0 0 1  Tired, decreased energy 0 0  1  Change in appetite 0 0 0  Feeling bad or failure about yourself  0 0 0  Trouble concentrating 0 0 3  Moving slowly or fidgety/restless 0 - 0  Suicidal thoughts 0 0 0  PHQ-9 Score 0 0 6  Difficult doing work/chores Not difficult at all Not difficult at all -    Social History   Tobacco Use  . Smoking status: Former Smoker    Last attempt to quit: 07/05/2010    Years since quitting: 7.7  . Smokeless tobacco: Former Network engineer Use Topics  . Alcohol use: Yes    Alcohol/week: 0.0 standard drinks    Comment: Occasional   . Drug use: No    Review of Systems Per HPI unless specifically indicated above     Objective:    BP 132/90 (BP Location:  Left Arm, Cuff Size: Normal)   Pulse 89   Temp 98.9 F (37.2 C) (Oral)   Resp 16   Ht 6\' 1"  (1.854 m)   Wt 280 lb (127 kg)   BMI 36.94 kg/m   Wt Readings from Last 3 Encounters:  04/06/18 280 lb (127 kg)  03/27/18 250 lb (113.4 kg)  10/24/17 285 lb (129.3 kg)    Physical Exam  Constitutional: He is oriented to person, place, and time. He appears well-developed and well-nourished. No distress.  Well-appearing, comfortable, cooperative, overweight/obese  HENT:  Head: Normocephalic and atraumatic.  Mouth/Throat: Oropharynx is clear and moist.  Eyes: Conjunctivae are normal. Right eye exhibits no discharge. Left eye exhibits no discharge.  Neck: Normal range of motion. Neck supple. No thyromegaly present.  Cardiovascular: Normal rate, regular rhythm, normal heart sounds and intact distal pulses.  No murmur heard. Pulmonary/Chest: Effort normal and breath sounds normal. No respiratory distress. He has no wheezes. He has no rales.  Musculoskeletal: Normal range of motion. He exhibits no edema.  Right Foot Tender to dorsal first MTP region Tolerate ambulation  Left Shoulder Inspection: Normal appearance bilateral symmetrical Palpation: Mild tender to palpation over anterior ROM: Reduced active ROM internal rotation, abduction, intact forward flex Special Testing: Rotator cuff testing negative for weakness with supraspinatus full can but some provoked symptoms with position change on empty can test, Hawkin's AC impingement POSITIVE for pain, AC joint str testing normal Strength: Normal strength 5/5 flex/ext, ext rot / int rot, grip, rotator cuff str testing. Neurovascular: Distally intact pulses, sensation to light touch   Lymphadenopathy:    He has no cervical adenopathy.  Neurological: He is alert and oriented to person, place, and time.  Skin: Skin is warm and dry. No rash noted. He is not diaphoretic. No erythema.  Psychiatric: He has a normal mood and affect. His behavior is  normal.  Well groomed, good eye contact, normal speech and thoughts  Nursing note and vitals reviewed.    I have personally reviewed the radiology report from 04/06/18 L shoulder x-ray.  CLINICAL DATA: Chronic LEFT shoulder pain. No known injury. Initial encounter.  EXAM: LEFT SHOULDER - 2+ VIEW  COMPARISON: None.  FINDINGS: There is no evidence of fracture or dislocation. There is no evidence of arthropathy or other focal bone abnormality. Soft tissues are unremarkable.  IMPRESSION: Negative.   Electronically Signed By: Margarette Canada M.D. On: 04/06/2018 13:56  Results for orders placed or performed during the hospital encounter of 02/21/17  Ferritin  Result Value Ref Range   Ferritin 24 24 - 336 ng/mL  Iron and TIBC  Result Value Ref Range  Iron 55 45 - 182 ug/dL   TIBC 410 250 - 450 ug/dL   Saturation Ratios 13 (L) 17.9 - 39.5 %   UIBC 355 ug/dL  Hepatic function panel  Result Value Ref Range   Total Protein 7.2 6.5 - 8.1 g/dL   Albumin 4.3 3.5 - 5.0 g/dL   AST 30 15 - 41 U/L   ALT 28 17 - 63 U/L   Alkaline Phosphatase 59 38 - 126 U/L   Total Bilirubin 0.7 0.3 - 1.2 mg/dL   Bilirubin, Direct <0.1 (L) 0.1 - 0.5 mg/dL   Indirect Bilirubin NOT CALCULATED 0.3 - 0.9 mg/dL  CBC  Result Value Ref Range   WBC 12.5 (H) 3.8 - 10.6 K/uL   RBC 5.61 4.40 - 5.90 MIL/uL   Hemoglobin 12.5 (L) 13.0 - 18.0 g/dL   HCT 37.4 (L) 40.0 - 52.0 %   MCV 66.7 (L) 80.0 - 100.0 fL   MCH 22.4 (L) 26.0 - 34.0 pg   MCHC 33.5 32.0 - 36.0 g/dL   RDW 15.7 (H) 11.5 - 14.5 %   Platelets 350 150 - 440 K/uL  Basic metabolic panel  Result Value Ref Range   Sodium 139 135 - 145 mmol/L   Potassium 3.5 3.5 - 5.1 mmol/L   Chloride 100 (L) 101 - 111 mmol/L   CO2 29 22 - 32 mmol/L   Glucose, Bld 91 65 - 99 mg/dL   BUN 11 6 - 20 mg/dL   Creatinine, Ser 1.26 (H) 0.61 - 1.24 mg/dL   Calcium 9.5 8.9 - 10.3 mg/dL   GFR calc non Af Amer >60 >60 mL/min   GFR calc Af Amer >60 >60 mL/min    Anion gap 10 5 - 15      Assessment & Plan:   Problem List Items Addressed This Visit    Essential hypertension    Moderately elevated initial BP, repeat manual check improved SBP but still elevated DBP. Prior med non adherence. Now with joint pain and likely OSA - Home BP readings: not available log - but reported elevated No known complications - anticipate underlying OSA is contributing    Plan:  1. Continue current BP regimen - Azilartan-Chlorthalidone 40-25mg  daily without change today - this pill does not come in higher ARB dose, alrdy on max dose thiazide. Will not change med at this time, instead treat pain / and pursue dx OSA 2. Encourage improved lifestyle - low sodium diet, regular exercise 3. Continue improving monitor BP outside office, bring readings to next visit, if persistently >140/90 or new symptoms notify office sooner 4. Follow-up 6 weeks for annual physical + labs      Suspected sleep apnea    Persistent clinical concern for suspected obstructive sleep apnea given reported symptoms with witnessed apnea, snoring and sleep disturbance, excessive sleepiness. - Screening: ESS score 13 / STOP-Bang Score 7 = high risk - Neck Circumference: 19" - Co-morbidities: HTN  Plan: 1. Discussion on initial diagnosis and testing for OSA, risk factors, management, complications 2. Agree to proceed with sleep study testing based on clinical concerns - order to be faxed to [facility] 04/07/18 - once chart completed        Other Visit Diagnoses    Chronic left shoulder pain    -  Primary  Consistent with subacute on chronic L-shoulder bursitis vs rotator cuff tendinopathy with some reduced active ROM but without significant evidence of muscle tear (no weakness). Some known lifting and repetitive  use contributing. No clear etiology of injury.  - No imaging on chart  Plan: 1. Check L shoulder x-ray today given chronicity of symptoms - reviewed results, patient to  be notified next day with results, negative x-ray - Continue Meloxicam NSAID already on - refill if request - likely goal 4-6 weeks then PRN Relative rest but keep shoulder mobile, avoid heavy lifting May try heating pad PRN Follow-up 6 weeks if not improved for re-evaluation, consider referral to Physical Therapy, and or subacromial steroid injection     Relevant Orders   DG Shoulder Left (Completed)   Needs flu shot       Relevant Orders   Flu Vaccine QUAD 6+ mos PF IM (Fluarix Quad PF) (Completed)   Hallux rigidus of right foot     Referral for new patient to [facility], for evaluation and management of subacute on chronic Right great toe / foot pain, prior dx hallux rigidus, old history of bone spur surgery 2003, he is active training in [employer], concern possible overuse or stress injury as well, will benefit from imaging and further interventions, improved on meloxicam, requesting 2nd opinion to proceed with his training.     Relevant Orders   Ambulatory referral to Podiatry      No orders of the defined types were placed in this encounter.  Orders Placed This Encounter  Procedures  . DG Shoulder Left    Standing Status:   Future    Number of Occurrences:   1    Standing Expiration Date:   06/07/2019    Order Specific Question:   Reason for Exam (SYMPTOM  OR DIAGNOSIS REQUIRED)    Answer:   chronic left shoulder pain, gradual worsening anterior and superior aspect, impingement symptoms    Order Specific Question:   Preferred imaging location?    Answer:   [facility]    Order Specific Question:   Radiology Contrast Protocol - do NOT remove file path    Answer:   [email or web address]  . Flu Vaccine QUAD 6+ mos PF IM (Fluarix Quad PF)  . Ambulatory referral to Podiatry    Referral Priority:   Routine    Referral Type:   Consultation    Referral Reason:   Specialty Services Required    Requested Specialty:    Podiatry    Number of Visits Requested:   1    Follow up plan: Return in about 6 weeks (around 05/18/2018) for Annual Physical (f/u sleep study, L shoulder).  Future labs re-ordered for 05/12/18  Nobie Putnam, [facility] 04/06/2018, 9:42 PM

## 2018-05-02 ENCOUNTER — Ambulatory Visit (INDEPENDENT_AMBULATORY_CARE_PROVIDER_SITE_OTHER)

## 2018-05-02 ENCOUNTER — Encounter: Payer: Self-pay | Admitting: Podiatry

## 2018-05-02 ENCOUNTER — Ambulatory Visit (INDEPENDENT_AMBULATORY_CARE_PROVIDER_SITE_OTHER): Admitting: Podiatry

## 2018-05-02 DIAGNOSIS — M205X1 Other deformities of toe(s) (acquired), right foot: Secondary | ICD-10-CM | POA: Diagnosis not present

## 2018-05-02 MED ORDER — METHYLPREDNISOLONE 4 MG PO TBPK: ORAL_TABLET | ORAL | 0 refills | Status: DC

## 2018-05-03 ENCOUNTER — Encounter: Payer: Self-pay | Admitting: Family Medicine

## 2018-05-03 NOTE — Progress Notes (Signed)
   HPI: 47 year old male presenting today as a new patient with a chief complaint of intermittent sharp, stabbing, burning and pulling pain to the right foot and right hallux that began several years ago. He states the pain has worsened over the past year. He states the pain radiates to the arch and burns with excessive walking. He reports associated swelling of the foot. He reports difficulty bending the hallux. He has been taking Meloxicam with some relief. He reports h/o surgery to the right hallux. Patient is here for further evaluation and treatment.   Past Medical History:  Diagnosis Date  . Allergy   . Anxiety   . Depression   . GERD (gastroesophageal reflux disease)   . High cholesterol   . Hx of migraines      Physical Exam: General: The patient is alert and oriented x3 in no acute distress.  Dermatology: Skin is warm, dry and supple bilateral lower extremities. Negative for open lesions or macerations.  Vascular: Palpable pedal pulses bilaterally. No edema or erythema noted. Capillary refill within normal limits.  Neurological: Epicritic and protective threshold grossly intact bilaterally.   Musculoskeletal Exam: Pain on palpation with limited range of motion noted to the first MPJ right foot.  Radiographic Exam: Degenerative changes noted with joint space narrowing first MPJ. There also appears to be extra-articular spurring noted about the joint.   Assessment: 1. Hallus limitus right  2. H/o cheilectomy right    Plan of Care:  1. Patient evaluated. X-Rays reviewed.  2. Injection of 0.5 mLs Celestone Soluspan injected into the 1st MPJ of the right foot.  3. Prescription for Medrol Dose Pak provided to patient. Then resume taking Meloxicam daily.  4. Note for work provided. No physical activity until 07/05/2018.  5. Return to clinic in 6 weeks.   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      Edrick Kins, DPM [facility]  Dr. Edrick Kins, DPM    [address], [place] [address]                Office [phone]  Fax [phone]

## 2018-05-12 ENCOUNTER — Other Ambulatory Visit: Payer: Self-pay

## 2018-05-15 ENCOUNTER — Other Ambulatory Visit

## 2018-05-15 DIAGNOSIS — F3342 Major depressive disorder, recurrent, in full remission: Secondary | ICD-10-CM

## 2018-05-15 DIAGNOSIS — I10 Essential (primary) hypertension: Secondary | ICD-10-CM

## 2018-05-15 DIAGNOSIS — D649 Anemia, unspecified: Secondary | ICD-10-CM

## 2018-05-15 DIAGNOSIS — E785 Hyperlipidemia, unspecified: Secondary | ICD-10-CM

## 2018-05-15 DIAGNOSIS — Z0000 Encounter for general adult medical examination without abnormal findings: Secondary | ICD-10-CM

## 2018-05-15 DIAGNOSIS — R29818 Other symptoms and signs involving the nervous system: Secondary | ICD-10-CM

## 2018-05-16 LAB — CBC WITH DIFFERENTIAL/PLATELET
Basophils Absolute: 19 cells/uL (ref 0–200)
Basophils Relative: 0.2 %
Eosinophils Absolute: 144 cells/uL (ref 15–500)
Eosinophils Relative: 1.5 %
HCT: 38.6 % (ref 38.5–50.0)
Hemoglobin: 12.6 g/dL — ABNORMAL LOW (ref 13.2–17.1)
Lymphs Abs: 3043 cells/uL (ref 850–3900)
MCH: 21.7 pg — ABNORMAL LOW (ref 27.0–33.0)
MCHC: 32.6 g/dL (ref 32.0–36.0)
MCV: 66.6 fL — ABNORMAL LOW (ref 80.0–100.0)
MPV: 9.5 fL (ref 7.5–12.5)
Monocytes Relative: 6.1 %
Neutro Abs: 5808 cells/uL (ref 1500–7800)
Neutrophils Relative %: 60.5 %
Platelets: 367 10*3/uL (ref 140–400)
RBC: 5.8 10*6/uL (ref 4.20–5.80)
RDW: 17.6 % — ABNORMAL HIGH (ref 11.0–15.0)
Total Lymphocyte: 31.7 %
WBC mixed population: 586 cells/uL (ref 200–950)
WBC: 9.6 10*3/uL (ref 3.8–10.8)

## 2018-05-16 LAB — COMPLETE METABOLIC PANEL WITH GFR
AG Ratio: 1.5 (calc) (ref 1.0–2.5)
ALT: 23 U/L (ref 9–46)
AST: 21 U/L (ref 10–40)
Albumin: 4.3 g/dL (ref 3.6–5.1)
Alkaline phosphatase (APISO): 66 U/L (ref 40–115)
BUN: 11 mg/dL (ref 7–25)
CO2: 32 mmol/L (ref 20–32)
Calcium: 9.4 mg/dL (ref 8.6–10.3)
Chloride: 98 mmol/L (ref 98–110)
Creat: 1.21 mg/dL (ref 0.60–1.35)
GFR, Est African American: 84 mL/min/{1.73_m2} (ref >=60)
GFR, Est Non African American: 72 mL/min/{1.73_m2} (ref >=60)
Globulin: 2.8 g/dL (calc) (ref 1.9–3.7)
Glucose, Bld: 91 mg/dL (ref 65–99)
Potassium: 3.8 mmol/L (ref 3.5–5.3)
Sodium: 139 mmol/L (ref 135–146)
Total Bilirubin: 0.7 mg/dL (ref 0.2–1.2)
Total Protein: 7.1 g/dL (ref 6.1–8.1)

## 2018-05-16 LAB — LIPID PANEL
Cholesterol: 306 mg/dL — ABNORMAL HIGH (ref <200)
HDL: 53 mg/dL (ref >40)
LDL Cholesterol (Calc): 212 mg/dL (calc) — ABNORMAL HIGH
Non-HDL Cholesterol (Calc): 253 mg/dL (calc) — ABNORMAL HIGH (ref <130)
Total CHOL/HDL Ratio: 5.8 (calc) — ABNORMAL HIGH (ref <5.0)
Triglycerides: 217 mg/dL — ABNORMAL HIGH (ref <150)

## 2018-05-16 LAB — TSH: TSH: 1.28 mIU/L (ref 0.40–4.50)

## 2018-05-16 LAB — HEMOGLOBIN A1C
Hgb A1c MFr Bld: 5.9 % of total Hgb — ABNORMAL HIGH (ref <5.7)
Mean Plasma Glucose: 123 (calc)
eAG (mmol/L): 6.8 (calc)

## 2018-05-16 LAB — T4, FREE: Free T4: 1.1 ng/dL (ref 0.8–1.8)

## 2018-05-16 LAB — CBC MORPHOLOGY

## 2018-05-19 ENCOUNTER — Encounter: Payer: Self-pay | Admitting: Family Medicine

## 2018-05-21 ENCOUNTER — Encounter: Payer: Self-pay | Admitting: Family Medicine

## 2018-05-21 DIAGNOSIS — R7309 Other abnormal glucose: Secondary | ICD-10-CM | POA: Insufficient documentation

## 2018-05-25 ENCOUNTER — Ambulatory Visit (INDEPENDENT_AMBULATORY_CARE_PROVIDER_SITE_OTHER): Admitting: Family Medicine

## 2018-05-25 ENCOUNTER — Encounter: Payer: Self-pay | Admitting: Family Medicine

## 2018-05-25 VITALS — BP 140/96 | HR 113 | Temp 98.4°F | Resp 16 | Ht 73.0 in | Wt 282.0 lb

## 2018-05-25 DIAGNOSIS — E782 Mixed hyperlipidemia: Secondary | ICD-10-CM

## 2018-05-25 DIAGNOSIS — Z9989 Dependence on other enabling machines and devices: Secondary | ICD-10-CM

## 2018-05-25 DIAGNOSIS — I10 Essential (primary) hypertension: Secondary | ICD-10-CM | POA: Diagnosis not present

## 2018-05-25 DIAGNOSIS — G4733 Obstructive sleep apnea (adult) (pediatric): Secondary | ICD-10-CM

## 2018-05-25 DIAGNOSIS — Z0000 Encounter for general adult medical examination without abnormal findings: Secondary | ICD-10-CM

## 2018-05-25 DIAGNOSIS — D581 Hereditary elliptocytosis: Secondary | ICD-10-CM | POA: Insufficient documentation

## 2018-05-25 DIAGNOSIS — E669 Obesity, unspecified: Secondary | ICD-10-CM

## 2018-05-25 DIAGNOSIS — D573 Sickle-cell trait: Secondary | ICD-10-CM | POA: Insufficient documentation

## 2018-05-25 DIAGNOSIS — R7309 Other abnormal glucose: Secondary | ICD-10-CM

## 2018-05-25 MED ORDER — PRAVASTATIN SODIUM 20 MG PO TABS: 20.0000 mg | ORAL_TABLET | Freq: Every day | ORAL | 3 refills | Status: DC

## 2018-05-25 NOTE — Patient Instructions (Addendum)
Thank you for coming to the office today.  1. Chemistry - Normal results, including electrolytes, kidney and liver function. - Normal fasting blood sugar   2. Hemoglobin A1c (Diabetes screening) - 5.9, elevated sugar, now in range of Pre-Diabetes (>5.7 to 6.4)   3. TSH Thyroid Function Tests - Normal.  4. CBC Blood Counts - Persistent anemia, and low MCV with peripheral blood smear showing - Elliptocytes. - We can discuss further in office, may warrant further consultation with Hematology specialist.  5. Cholesterol - Abnormal cholesterol results.  - Significantly elevated LDL bad cholesterol - 212, concern that Pravastatin is not effective - may need to switch med.  Keep track of BP regularly if possible few times a week, write readings down and call if elevated >140/90 persistently may need to add Amlodipine  Please schedule a Follow-up Appointment to: Return in about 6 months (around 11/23/2018) for 6 months - PreDM A1c, HTN, OSA f/u.  If you have any other questions or concerns, please feel free to call the office or send a message through [place]. You may also schedule an earlier appointment if necessary.  Additionally, you may be receiving a survey about your experience at our office within a few days to 1 week by e-mail or mail. We value your feedback.  Nobie Putnam, DO [place]

## 2018-05-25 NOTE — Progress Notes (Signed)
Subjective:    Patient ID: Joseph Wilcox, male    DOB: 1970-07-19, 47 y.o.   MRN: [id number]  Joseph Wilcox is a 47 y.o. male presenting on 05/25/2018 for Annual Exam   HPI   Here for Annual Physical and Lab Review  Elevated A1c vs PreDM Last A1c 5.9, no prior comparison. No significant fam history DM Not limiting sugars, but plans to try improving this. Regularly active, exercise  OSA on CPAP Now has CPAP recently started wearing within past week, adjusting, not wearing all night yet. Wakes up at times.  CHRONIC HTN: Reports not checking BP at home Current Meds - Edarbyclor 40-25mg  daily   Reports good compliance, took meds today. Tolerating well, w/o complaints.  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 MCV 60s. Peripheral smear shows elliptocytes. Known sickle cell trait, no prior problems.   Additional complaint L shoulder still painful - same pain as before limited benefit on NSAID, cannot sleep on L side, limited mobility with shoulder. Wakes up at times. Previously had x-ray few month ago did not show arthritis.   Health Maintenance: UTD Flu vaccine 04/06/18  Depression screen [facility] 2/9 05/25/2018 04/06/2018 10/24/2017  Decreased Interest 0 0 0  Down, Depressed, Hopeless 0 0 0  PHQ - 2 Score 0 0 0  Altered sleeping 0 0 0  Tired, decreased energy 0 0 0  Change in appetite 0 0 0  Feeling bad or failure about yourself  0 0 0  Trouble concentrating 0 0 0  Moving slowly or fidgety/restless 0 0 -  Suicidal thoughts 0 0 0  PHQ-9 Score 0 0 0  Difficult doing work/chores - Not difficult at all Not difficult at all     Past Medical History:  Diagnosis Date  . Allergy   . Anxiety   . Depression   . GERD (gastroesophageal reflux disease)   . High cholesterol   . Hx of migraines   . Major depression, recurrent, full remission ([place]) 10/09/2015   Past Surgical History:  Procedure Laterality Date  . FOOT SURGERY  2002   Bone spur  . TONSILLECTOMY     Social History   Socioeconomic History  . Marital status: Unknown    Spouse name: Not on file  . Number of children: Not on file  . Years of education: Not on file  . Highest education level: Not on file  Occupational History  . Not on file  Social Needs  . Financial resource strain: Not on file  . Food insecurity:    Worry: Not on file    Inability: Not on file  . Transportation needs:    Medical: Not on file    Non-medical: Not on file  Tobacco Use  . Smoking status: Former Smoker    Last attempt to quit: 07/05/2010    Years since quitting: 7.8  . Smokeless tobacco: Former Network engineer and Sexual Activity  . Alcohol use: Yes    Alcohol/week: 0.0 standard drinks    Comment: Occasional   . Drug use: No  . Sexual activity: Yes    Partners: Female    Birth control/protection: None  Lifestyle  . Physical  activity:    Days per week: Not on file    Minutes per session: Not on file  . Stress: Not on file  Relationships  . Social connections:    Talks on phone: Not on file    Gets together: Not on file    Attends religious service: Not on file    Active member of club or organization: Not on file    Attends meetings of clubs or organizations: Not on file    Relationship status: Not on file  . Intimate partner violence:    Fear of current or ex partner: Not on file    Emotionally abused: Not on file    Physically abused: Not on file    Forced sexual activity: Not on file  Other Topics Concern  . Not on file  Social History Narrative  . Not on file   Family History  Problem Relation Age of Onset  . Diabetes Mother   .  Prostate cancer Neg Hx   . Colon cancer Neg Hx    Current Outpatient Medications on File Prior to Visit  Medication Sig  . ARIPiprazole (ABILIFY) 2 MG tablet daily  . Azilsartan-Chlorthalidone (EDARBYCLOR) 40-25 MG TABS Take 1 tablet by mouth daily.  . DULoxetine (CYMBALTA) 60 MG capsule Take 1 capsule (60 mg total) by mouth daily.  . meloxicam (MOBIC) 15 MG tablet Take 1 tablet (15 mg total) by mouth daily.  Marland Kitchen omeprazole (PRILOSEC) 20 MG capsule TAKE ONE CAPSULE BY MOUTH TWICE DAILY BEFORE A MEAL   No current facility-administered medications on file prior to visit.     Review of Systems  Constitutional: Negative for activity change, appetite change, chills, diaphoresis, fatigue and fever.  HENT: Negative for congestion and hearing loss.   Eyes: Negative for visual disturbance.  Respiratory: Negative for apnea, cough, choking, chest tightness, shortness of breath and wheezing.   Cardiovascular: Negative for chest pain, palpitations and leg swelling.  Gastrointestinal: Negative for abdominal pain, anal bleeding, blood in stool, constipation, diarrhea, nausea and vomiting.  Endocrine: Negative for cold intolerance.  Genitourinary: Negative for decreased urine volume, difficulty urinating, dysuria, frequency, hematuria, testicular pain and urgency.  Musculoskeletal: Positive for arthralgias (L shoulder pain). Negative for back pain and neck pain.  Skin: Negative for rash.  Allergic/Immunologic: Negative for environmental allergies.  Neurological: Negative for dizziness, weakness, light-headedness, numbness and headaches.  Hematological: Negative for adenopathy.  Psychiatric/Behavioral: Negative for behavioral problems, dysphoric mood and sleep disturbance. The patient is not nervous/anxious.    Per HPI unless specifically indicated above     Objective:    BP (!) 140/96 (BP Location: Left Arm, Cuff Size: Normal)   Pulse (!) 113   Temp 98.4 F (36.9 C) (Oral)   Resp 16   Ht 6\' 1"   (1.854 m)   Wt 282 lb (127.9 kg)   BMI 37.21 kg/m   Wt Readings from Last 3 Encounters:  05/25/18 282 lb (127.9 kg)  04/06/18 280 lb (127 kg)  03/27/18 250 lb (113.4 kg)    Physical Exam  Constitutional: He is oriented to person, place, and time. He appears well-developed and well-nourished. No distress.  Well-appearing, comfortable, cooperative, muscular build  HENT:  Head: Normocephalic and atraumatic.  Mouth/Throat: Oropharynx is clear and moist.  Frontal / maxillary sinuses non-tender. Nares patent without purulence or edema. Bilateral TMs clear without erythema, effusion or bulging. Oropharynx clear without erythema, exudates, edema or asymmetry.  Eyes: Pupils are equal, round, and reactive to light. Conjunctivae and  EOM are normal. Right eye exhibits no discharge. Left eye exhibits no discharge.  Neck: Normal range of motion. Neck supple. No thyromegaly present.  Cardiovascular: Normal rate, regular rhythm, normal heart sounds and intact distal pulses.  No murmur heard. Pulmonary/Chest: Effort normal and breath sounds normal. No respiratory distress. He has no wheezes. He has no rales.  Abdominal: Soft. Bowel sounds are normal. He exhibits no distension and no mass. There is no tenderness.  Musculoskeletal: He exhibits no edema or tenderness.  Upper / Lower Extremities: - Normal muscle tone, strength bilateral upper extremities 5/5, lower extremities 5/5  L shoulder - limited range of motion forward flex and abduction. Intact internal rotation. Rotator cuff strength intact but some pain reproduced.  Lymphadenopathy:    He has no cervical adenopathy.  Neurological: He is alert and oriented to person, place, and time.  Distal sensation intact to light touch all extremities  Skin: Skin is warm and dry. No rash noted. He is not diaphoretic. No erythema.  Psychiatric: He has a normal mood and affect. His behavior is normal.  Well groomed, good eye contact, normal speech and thoughts   Nursing note and vitals reviewed.  Results for orders placed or performed in visit on 05/15/18  T4, free  Result Value Ref Range   Free T4 1.1 0.8 - 1.8 ng/dL  TSH  Result Value Ref Range   TSH 1.28 0.40 - 4.50 mIU/L  Lipid panel  Result Value Ref Range   Cholesterol 306 (H) <200 mg/dL   HDL 53 >40 mg/dL   Triglycerides 217 (H) <150 mg/dL   LDL Cholesterol (Calc) 212 (H) mg/dL (calc)   Total CHOL/HDL Ratio 5.8 (H) <5.0 (calc)   Non-HDL Cholesterol (Calc) 253 (H) <130 mg/dL (calc)  COMPLETE METABOLIC PANEL WITH GFR  Result Value Ref Range   Glucose, Bld 91 65 - 99 mg/dL   BUN 11 7 - 25 mg/dL   Creat 1.21 0.60 - 1.35 mg/dL   GFR, Est Non African American 72 > OR = 60 mL/min/1.80m2   GFR, Est African American 84 > OR = 60 mL/min/1.75m2   BUN/Creatinine Ratio NOT APPLICABLE 6 - 22 (calc)   Sodium 139 135 - 146 mmol/L   Potassium 3.8 3.5 - 5.3 mmol/L   Chloride 98 98 - 110 mmol/L   CO2 32 20 - 32 mmol/L   Calcium 9.4 8.6 - 10.3 mg/dL   Total Protein 7.1 6.1 - 8.1 g/dL   Albumin 4.3 3.6 - 5.1 g/dL   Globulin 2.8 1.9 - 3.7 g/dL (calc)   AG Ratio 1.5 1.0 - 2.5 (calc)   Total Bilirubin 0.7 0.2 - 1.2 mg/dL   Alkaline phosphatase (APISO) 66 40 - 115 U/L   AST 21 10 - 40 U/L   ALT 23 9 - 46 U/L  CBC with Differential/Platelet  Result Value Ref Range   WBC 9.6 3.8 - 10.8 Thousand/uL   RBC 5.80 4.20 - 5.80 Million/uL   Hemoglobin 12.6 (L) 13.2 - 17.1 g/dL   HCT 38.6 38.5 - 50.0 %   MCV 66.6 (L) 80.0 - 100.0 fL   MCH 21.7 (L) 27.0 - 33.0 pg   MCHC 32.6 32.0 - 36.0 g/dL   RDW 17.6 (H) 11.0 - 15.0 %   Platelets 367 140 - 400 Thousand/uL   MPV 9.5 7.5 - 12.5 fL   Neutro Abs 5,808 1,500 - 7,800 cells/uL   Lymphs Abs 3,043 850 - 3,900 cells/uL   WBC mixed population 586 200 -  950 cells/uL   Eosinophils Absolute 144 15 - 500 cells/uL   Basophils Absolute 19 0 - 200 cells/uL   Neutrophils Relative % 60.5 %   Total Lymphocyte 31.7 %   Monocytes Relative 6.1 %   Eosinophils  Relative 1.5 %   Basophils Relative 0.2 %   Smear Review    Hemoglobin A1c  Result Value Ref Range   Hgb A1c MFr Bld 5.9 (H) <5.7 % of total Hgb   Mean Plasma Glucose 123 (calc)   eAG (mmol/L) 6.8 (calc)  CBC MORPHOLOGY  Result Value Ref Range   CBC MORPHOLOGY  NORMAL      Assessment & Plan:   Problem List Items Addressed This Visit    Elevated hemoglobin A1c    Mild elevated A1c 5.9, in range of PreDM  Plan:  1. Not on any therapy currently  2. Encourage improved lifestyle - low carb, low sugar diet, reduce portion size, continue improving regular exercise 3. 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 Prior med non adherence. Now with joint pain and likely OSA No known complications    Plan:  1. Continue current BP regimen - Azilartan-Chlorthalidone 40-25mg  daily without change today - Defer because JUST started CPAP now this week, and will treat L shoulder pain 2. Encourage improved lifestyle - low sodium diet, regular exercise 3. Continue improving monitor BP outside office, bring readings to next visit, if persistently >140/90 or new symptoms notify office sooner 4. Follow-up 6 months - next add on if not improved within 2-4 weeks is Amlodipine CCB patient to call back with BP readings      Relevant Medications   pravastatin (PRAVACHOL) 20 MG tablet   Mixed hyperlipidemia    Uncontrolled cholesterol on off statin due to non adherence Last lipid panel 05/2018  Plan: 1. RESTART Pravastatin 20mg  daily 2. Encourage improved lifestyle - low carb/cholesterol, reduce portion size, continue improving exercise      Relevant Medications   pravastatin (PRAVACHOL) 20 MG tablet   Obesity (BMI  35.0-39.9 without comorbidity)    Wt gain 2 lbs in 1-2 months Encourage lifestyle diet exercise      OSA on CPAP    Improved control now of newly dx chronic OSA on CPAP - Good adherence to CPAP nightly but unable to wear whole night, tolerating still - Continue current CPAP therapy, patient seems to be benefiting from therapy  Try wearing mask during evening some before bed to adjust      Sickle cell trait (HCC)    History of sickle cell trait Asymptomatic Has labs consistent with microcytic anemia Also elliptocytosis on smear       Other Visit Diagnoses    Annual physical exam    -  Primary      Updated Health Maintenance information -Reviewed recent lab results with patient Encouraged improvement to lifestyle with diet and exercise - Goal of weight loss   Meds ordered this encounter  Medications  . pravastatin (PRAVACHOL) 20 MG tablet    Sig: Take 1 tablet (20 mg total) by mouth daily.    Dispense:  90 tablet    Refill:  3    Follow  up plan: Return in about 6 months (around 11/23/2018) for 6 months - PreDM A1c, HTN, OSA f/u.  Nobie Putnam, DO [facility] 05/25/2018, 9:07 AM

## 2018-05-26 ENCOUNTER — Telehealth: Payer: Self-pay | Admitting: Family Medicine

## 2018-05-26 DIAGNOSIS — M25512 Pain in left shoulder: Principal | ICD-10-CM

## 2018-05-26 DIAGNOSIS — G8929 Other chronic pain: Secondary | ICD-10-CM

## 2018-05-26 NOTE — Assessment & Plan Note (Addendum)
Moderately elevated initial BP, repeat manual check improved SBP but still elevated DBP. Prior med non adherence. Now with joint pain and likely OSA No known complications    Plan:  1. Continue current BP regimen - Azilartan-Chlorthalidone 40-25mg  daily without change today - Defer because JUST started CPAP now this week, and will treat L shoulder pain 2. Encourage improved lifestyle - low sodium diet, regular exercise 3. Continue improving monitor BP outside office, bring readings to next visit, if persistently >140/90 or new symptoms notify office sooner 4. Follow-up 6 months - next add on if not improved within 2-4 weeks is Amlodipine CCB patient to call back with BP readings

## 2018-05-26 NOTE — Assessment & Plan Note (Signed)
Improved control now of newly dx chronic OSA on CPAP - Good adherence to CPAP nightly but unable to wear whole night, tolerating still - Continue current CPAP therapy, patient seems to be benefiting from therapy  Try wearing mask during evening some before bed to adjust

## 2018-05-26 NOTE — Assessment & Plan Note (Signed)
Mild elevated A1c 5.9, in range of PreDM  Plan:  1. Not on any therapy currently  2. Encourage improved lifestyle - low carb, low sugar diet, reduce portion size, continue improving regular exercise 3. Follow-up 6 mo PreDM A1c

## 2018-05-26 NOTE — Assessment & Plan Note (Signed)
Uncontrolled cholesterol on off statin due to non adherence Last lipid panel 05/2018  Plan: 1. RESTART Pravastatin 20mg  daily 2. Encourage improved lifestyle - low carb/cholesterol, reduce portion size, continue improving exercise

## 2018-05-26 NOTE — Assessment & Plan Note (Signed)
Secondary to sickle cell trait and other inherited microcytic anemia as well likely Follow-up if not improving or symptoms, can refer to Heme

## 2018-05-26 NOTE — Assessment & Plan Note (Signed)
Wt gain 2 lbs in 1-2 months Encourage lifestyle diet exercise

## 2018-05-26 NOTE — Assessment & Plan Note (Signed)
History of sickle cell trait Asymptomatic Has labs consistent with microcytic anemia Also elliptocytosis on smear

## 2018-05-26 NOTE — Telephone Encounter (Signed)
Called back to request Orthopedic referral for L Shoulder chronic pain, not improving. Will send to [facility].  Nobie Putnam, DO [facility] 05/26/2018, 1:11 PM

## 2018-05-29 ENCOUNTER — Ambulatory Visit: Payer: Self-pay | Admitting: Psychiatry

## 2018-06-12 ENCOUNTER — Ambulatory Visit: Payer: Self-pay | Admitting: Psychiatry

## 2018-06-13 ENCOUNTER — Ambulatory Visit (INDEPENDENT_AMBULATORY_CARE_PROVIDER_SITE_OTHER): Admitting: Podiatry

## 2018-06-13 ENCOUNTER — Encounter: Payer: Self-pay | Admitting: Podiatry

## 2018-06-13 DIAGNOSIS — M205X1 Other deformities of toe(s) (acquired), right foot: Secondary | ICD-10-CM | POA: Diagnosis not present

## 2018-06-14 NOTE — Progress Notes (Signed)
   HPI: 47 year old male presenting today for a follow up evaluation of hallux limitus of the right foot. He reports significant relief after receiving the injection at his last visit. There are no modifying factors noted. He has been taking Meloxicam as directed. Patient is here for further evaluation and treatment.   Past Medical History:  Diagnosis Date  . Allergy   . Anxiety   . Depression   . GERD (gastroesophageal reflux disease)   . High cholesterol   . Hx of migraines   . Major depression, recurrent, full remission ([place]) 10/09/2015     Physical Exam: General: The patient is alert and oriented x3 in no acute distress.  Dermatology: Skin is warm, dry and supple bilateral lower extremities. Negative for open lesions or macerations.  Vascular: Palpable pedal pulses bilaterally. No edema or erythema noted. Capillary refill within normal limits.  Neurological: Epicritic and protective threshold grossly intact bilaterally.   Musculoskeletal Exam: Pain on palpation with limited range of motion noted to the first MPJ right foot.  Assessment: 1. Hallux limitus right - improved  2. H/o cheilectomy right    Plan of Care:  1. Patient evaluated.  2. Appointment with Liliane Channel, Pedorthist, for custom molded orthotics.  3. Continue wearing good shoe gear.  4. Injection helped significantly so we will monitor at the moment.  5. Return to clinic as needed if pain returns.   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      Edrick Kins, DPM [facility]  Dr. Edrick Kins, DPM    [address], [place] [address]                Office [phone]  Fax [phone]

## 2018-10-08 ENCOUNTER — Other Ambulatory Visit: Payer: Self-pay | Admitting: Family Medicine

## 2018-10-08 DIAGNOSIS — I10 Essential (primary) hypertension: Secondary | ICD-10-CM

## 2018-12-01 ENCOUNTER — Other Ambulatory Visit: Payer: Self-pay

## 2018-12-01 ENCOUNTER — Encounter: Payer: Self-pay | Admitting: Family Medicine

## 2018-12-01 ENCOUNTER — Ambulatory Visit (INDEPENDENT_AMBULATORY_CARE_PROVIDER_SITE_OTHER): Admitting: Family Medicine

## 2018-12-01 DIAGNOSIS — K219 Gastro-esophageal reflux disease without esophagitis: Secondary | ICD-10-CM | POA: Diagnosis not present

## 2018-12-01 DIAGNOSIS — F3342 Major depressive disorder, recurrent, in full remission: Secondary | ICD-10-CM

## 2018-12-01 DIAGNOSIS — R7309 Other abnormal glucose: Secondary | ICD-10-CM

## 2018-12-01 DIAGNOSIS — I10 Essential (primary) hypertension: Secondary | ICD-10-CM | POA: Diagnosis not present

## 2018-12-01 MED ORDER — OMEPRAZOLE 20 MG PO CPDR: 20.0000 mg | DELAYED_RELEASE_CAPSULE | Freq: Two times a day (BID) | ORAL | 1 refills | Status: DC

## 2018-12-01 NOTE — Assessment & Plan Note (Signed)
Elevated BP by report No known complications On CPAP for OSA    Plan:  1. Continue current BP regimen - Azilartan-Chlorthalidone 40-25mg  daily without change today 2. Encourage improved lifestyle - low sodium diet, regular exercise 3. Continue improving monitor BP outside office, bring readings to next visit, if persistently >140/90 or new symptoms notify office sooner 4. Follow-up 4 weeks next add on if not improved within 2-4 weeks is Amlodipine CCB if need

## 2018-12-01 NOTE — Progress Notes (Signed)
Virtual Visit via Telephone The purpose of this virtual visit is to provide medical care while limiting exposure to the novel coronavirus (COVID19) for both patient and office staff.  Consent was obtained for phone visit:  Yes.   Answered questions that patient had about telehealth interaction:  Yes.   I discussed the limitations, risks, security and privacy concerns of performing an evaluation and management service by telephone. I also discussed with the patient that there may be a patient responsible charge related to this service. The patient expressed understanding and agreed to proceed.  Patient Location: Home Provider Location: [facility])  ---------------------------------------------------------------------- Chief Complaint  Patient presents with  . Hypertension    S: Reviewed CMA documentation. I have called patient and gathered additional HPI as follows:  Left Shoulder Pain - Last visit with me 10-05/2018, for initial visit for same problem L shoulder, treated with X-ray L shoulder x-ray that was negative and referred to Orthopedics, see prior notes for background information. - Interval update with now following w/ [facility] Dr Mack Guise, s/p injection Left shoulder injection back in 07/2018 with nearly 85-90% improvement, but now wearing off after about 3-4 months - Today patient reports he still has some pain in L shoulder. He is going to follow back up with Ortho. - Denies any numbness tingling, weakness in upper extremity, upper back or neck pain  Elevated A1c vs PreDM Last A1c 5.9, 05/2018, due for repeat No significant fam history DM Not limiting sugars, but plans to try improving this. Regularly active, exercise  CHRONIC HTN: Reports not checking BP at home, he checks at pharmacy occasionally has had some elevated readings, wants to come into office to re-check Current Meds - Edarbyclor 40-25mg  daily   Reports good compliance, took meds  today. Tolerating well, w/o complaints.  FOLLOW-UP Major Depression recurrent REMISSION / Generalized Anxiety Disorder Followed by [facility] Dr Gretel Acre, last seen in 2019, has been stable on current meds Abilify 2mg  and Cymbalta 60mg  daily. No new concerns at this time. See GAD PHQ for ROS  Denies any high risk travel to areas of current concern for COVID19. Denies any known or suspected exposure to person with or possibly with COVID19.   Past Medical History:  Diagnosis Date  . Allergy   . Anxiety   . Depression   . GERD (gastroesophageal reflux disease)   . High cholesterol   . Hx of migraines   . Major depression, recurrent, full remission ([place]) 10/09/2015   Social History   Tobacco Use  . Smoking status: Former Smoker    Last attempt to quit: 07/05/2010    Years since quitting: 8.4  . Smokeless tobacco: Former Network engineer Use Topics  . Alcohol use: Yes    Alcohol/week: 0.0 standard drinks    Comment: Occasional   . Drug use: No    Current Outpatient Medications:  .  ARIPiprazole (ABILIFY) 2 MG tablet, daily, Disp: 90 tablet, Rfl: 1 .  DULoxetine (CYMBALTA) 60 MG capsule, Take 1 capsule (60 mg total) by mouth daily., Disp: 90 capsule, Rfl: 1 .  EDARBYCLOR 40-25 MG TABS, TAKE 1 TABLET BY MOUTH DAILY, Disp: 90 tablet, Rfl: 1 .  meloxicam (MOBIC) 15 MG tablet, Take 1 tablet (15 mg total) by mouth daily., Disp: 30 tablet, Rfl: 0 .  omeprazole (PRILOSEC) 20 MG capsule, Take 1 capsule (20 mg total) by mouth 2 (two) times daily before a meal., Disp: 180 capsule, Rfl: 1 .  pravastatin (PRAVACHOL) 20  MG tablet, Take 1 tablet (20 mg total) by mouth daily., Disp: 90 tablet, Rfl: 3  Depression screen [facility] 2/9 12/01/2018 05/25/2018 04/06/2018  Decreased Interest 0 0 0  Down, Depressed, Hopeless 0 0 0  PHQ - 2 Score 0 0 0  Altered sleeping 0 0 0  Tired, decreased energy 0 0 0  Change in appetite 0 0 0  Feeling bad or failure about yourself  0 0 0  Trouble concentrating 0 0 0  Moving  slowly or fidgety/restless 0 0 0  Suicidal thoughts 0 0 0  PHQ-9 Score 0 0 0  Difficult doing work/chores Not difficult at all - Not difficult at all    GAD 7 : Generalized Anxiety Score 12/01/2018 04/06/2018 10/24/2017 10/09/2015  Nervous, Anxious, on Edge 0 0 0 3  Control/stop worrying 0 0 0 3  Worry too much - different things 0 0 0 3  Trouble relaxing 0 0 0 3  Restless 0 0 0 3  Easily annoyed or irritable 0 0 0 3  Afraid - awful might happen 0 0 0 3  Total GAD 7 Score 0 0 0 21  Anxiety Difficulty Not difficult at all Not difficult at all Not difficult at all Extremely difficult    -------------------------------------------------------------------------- O: No physical exam performed due to remote telephone encounter.  Lab results reviewed.  No results found for this or any previous visit (from the past 2160 hour(s)).  -------------------------------------------------------------------------- A&P:  Problem List Items Addressed This Visit    Elevated hemoglobin A1c    Mild elevated A1c 5.9, in range of PreDM  Plan:  1. Not on any therapy currently  2. Encourage improved lifestyle - low carb, low sugar diet, reduce portion size, continue improving regular exercise 3. Follow-up A1c lab in 4 weeks      Essential hypertension - Primary    Elevated BP by report No known complications On CPAP for OSA    Plan:  1. Continue current BP regimen - Azilartan-Chlorthalidone 40-25mg  daily without change today 2. Encourage improved lifestyle - low sodium diet, regular exercise 3. Continue improving monitor BP outside office, bring readings to next visit, if persistently >140/90 or new symptoms notify office sooner 4. Follow-up 4 weeks next add on if not improved within 2-4 weeks is Amlodipine CCB if need      GERD (gastroesophageal reflux disease)    Controlled GERD Refill Omeprazole 20mg  BID      Relevant Medications   omeprazole (PRILOSEC) 20 MG capsule   Major depression,  recurrent, full remission ([place])    Controlled major depression in remission Followed by [facility] Dr Gretel Acre On Abilify 2mg , Cymbalta 60mg  daily         Meds ordered this encounter  Medications  . omeprazole (PRILOSEC) 20 MG capsule    Sig: Take 1 capsule (20 mg total) by mouth 2 (two) times daily before a meal.    Dispense:  180 capsule    Refill:  1    Follow-up: - Return in 4 weeks face to face PreDM A1c, HTN med adjust, Shoulder Pain  Patient verbalizes understanding with the above medical recommendations including the limitation of remote medical advice.  Specific follow-up and call-back criteria were given for patient to follow-up or seek medical care more urgently if needed.   - Time spent in direct consultation with patient on phone: 8 minutes   Nobie Putnam, [facility] 12/01/2018, 8:28 AM

## 2018-12-01 NOTE — Assessment & Plan Note (Signed)
Controlled GERD Refill Omeprazole 20mg  BID

## 2018-12-01 NOTE — Assessment & Plan Note (Signed)
Controlled major depression in remission Followed by [facility] Psychiatry Dr Gretel Acre On Abilify 2mg , Cymbalta 60mg  daily

## 2018-12-01 NOTE — Assessment & Plan Note (Signed)
Mild elevated A1c 5.9, in range of PreDM  Plan:  1. Not on any therapy currently  2. Encourage improved lifestyle - low carb, low sugar diet, reduce portion size, continue improving regular exercise 3. Follow-up A1c lab in 4 weeks

## 2018-12-01 NOTE — Patient Instructions (Addendum)
AVS info given by phone. No mychart access. 

## 2018-12-29 ENCOUNTER — Ambulatory Visit (INDEPENDENT_AMBULATORY_CARE_PROVIDER_SITE_OTHER): Admitting: Family Medicine

## 2018-12-29 ENCOUNTER — Encounter: Payer: Self-pay | Admitting: Family Medicine

## 2018-12-29 ENCOUNTER — Other Ambulatory Visit: Payer: Self-pay

## 2018-12-29 ENCOUNTER — Other Ambulatory Visit: Payer: Self-pay | Admitting: Family Medicine

## 2018-12-29 VITALS — BP 142/90 | HR 93 | Temp 98.5°F | Ht 73.0 in | Wt 293.2 lb

## 2018-12-29 DIAGNOSIS — Z0000 Encounter for general adult medical examination without abnormal findings: Secondary | ICD-10-CM

## 2018-12-29 DIAGNOSIS — K219 Gastro-esophageal reflux disease without esophagitis: Secondary | ICD-10-CM

## 2018-12-29 DIAGNOSIS — I10 Essential (primary) hypertension: Secondary | ICD-10-CM | POA: Diagnosis not present

## 2018-12-29 DIAGNOSIS — Z9989 Dependence on other enabling machines and devices: Secondary | ICD-10-CM

## 2018-12-29 DIAGNOSIS — E782 Mixed hyperlipidemia: Secondary | ICD-10-CM

## 2018-12-29 DIAGNOSIS — E669 Obesity, unspecified: Secondary | ICD-10-CM

## 2018-12-29 DIAGNOSIS — D581 Hereditary elliptocytosis: Secondary | ICD-10-CM

## 2018-12-29 DIAGNOSIS — G4733 Obstructive sleep apnea (adult) (pediatric): Secondary | ICD-10-CM

## 2018-12-29 DIAGNOSIS — R7309 Other abnormal glucose: Secondary | ICD-10-CM | POA: Diagnosis not present

## 2018-12-29 LAB — POCT GLYCOSYLATED HEMOGLOBIN (HGB A1C): Hemoglobin A1C: 5.9 % — AB (ref 4.0–5.6)

## 2018-12-29 MED ORDER — AMLODIPINE BESYLATE 10 MG PO TABS: 10.0000 mg | ORAL_TABLET | Freq: Every day | ORAL | 1 refills | Status: DC

## 2018-12-29 MED ORDER — OMEPRAZOLE 20 MG PO CPDR: 20.0000 mg | DELAYED_RELEASE_CAPSULE | Freq: Two times a day (BID) | ORAL | 1 refills | Status: DC

## 2018-12-29 NOTE — Assessment & Plan Note (Addendum)
Improved control now of chronic OSA on CPAP - Good adherence to CPAP nightly now wear most of night, tolerating - Continue current CPAP therapy, patient seems to be benefiting from therapy 

## 2018-12-29 NOTE — Assessment & Plan Note (Signed)
Weight gain 10 lbs Not adhering to lifestyle Goal to resume diet exercise regimen
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
## 2018-12-29 NOTE — Assessment & Plan Note (Addendum)
Elevated BP today initial, repeat manual improve but still elevated. Weight gain and poor lifestyle No home readings available. On CPAP for OSA    Plan:  1. ADD new BP med - Amlodipine 10mg  daily - can start with half tab for 5mg  daily if prefer. Otherwise continue Azilartan-Chlorthalidone 40-25mg  daily 2. Encourage improved lifestyle - low sodium diet, regular exercise 3. Continue improving monitor BP outside office, bring readings to next visit, if persistently >140/90 or new symptoms notify office sooner  Consider referral in future if still uncontrolled, may benefit from cardiology or other 2ndary work up HTN, already on CPAP. May need to lose weight and now improve lifestyle as well
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
## 2018-12-29 NOTE — Patient Instructions (Addendum)
Thank you for coming to the office today.  Try to resume lifestyle improvements, diet low carb low sugar, reduce portions, restart regular exercise activity, goal weight loss to help control blood pressure and blood sugar.  Keep on CPAP  Elevated BP still, need to check BP at home more often 1-2 times a week if possible write numbers down, goal < 140 / 90 - START new med Amlodipine 10mg  daily, in addition to other BP med - If too strong can cut dose in HALF one a day Stay well hydrated  Restart Omeprazole 20mg  twice a day, re ordered medicine, if still having severe episodes nausea vomiting and blood can call,  Referral to [facility]) [address], [place] [address] Phone: [phone]  DUE for [place] (no food or drink after midnight before the lab appointment, only water or coffee without cream/sugar on the morning of)  SCHEDULE "Lab Only" visit in the morning at the clinic for lab draw in 4 MONTHS   - Make sure Lab Only appointment is at about 1 week before your next appointment, so that results will be available  For Lab Results, once available within 2-3 days of blood draw, you can can log in to MyChart online to view your results and a brief explanation. Also, we can discuss results at next follow-up visit.    Please schedule a Follow-up Appointment to: Return in about 4 months (around 04/30/2019) for Annual Physical.  If you have any other questions or concerns, please feel free to call the office or send a message through [place]. You may also schedule an earlier appointment if necessary.  Additionally, you may be receiving a survey about your experience at our office within a few days to 1 week by e-mail or mail. We value your feedback.  Nobie Putnam, DO [facility]

## 2018-12-29 NOTE — Assessment & Plan Note (Signed)
Stable, unchanged Mild elevated A1c 5.9, in range of PreDM  Plan:  1. Not on any therapy currently  2. Encourage improved lifestyle - low carb, low sugar diet, reduce portion size, continue improving regular exercise

## 2019-01-15 ENCOUNTER — Other Ambulatory Visit: Payer: Self-pay | Admitting: Psychiatry

## 2019-01-15 DIAGNOSIS — F419 Anxiety disorder, unspecified: Secondary | ICD-10-CM

## 2019-01-17 ENCOUNTER — Other Ambulatory Visit: Payer: Self-pay | Admitting: Psychiatry

## 2019-01-17 DIAGNOSIS — F419 Anxiety disorder, unspecified: Secondary | ICD-10-CM

## 2019-02-06 ENCOUNTER — Telehealth: Payer: Self-pay

## 2019-02-06 ENCOUNTER — Other Ambulatory Visit: Payer: Self-pay

## 2019-02-06 ENCOUNTER — Ambulatory Visit (INDEPENDENT_AMBULATORY_CARE_PROVIDER_SITE_OTHER): Admitting: Gastroenterology

## 2019-02-06 DIAGNOSIS — D509 Iron deficiency anemia, unspecified: Secondary | ICD-10-CM

## 2019-02-06 DIAGNOSIS — K219 Gastro-esophageal reflux disease without esophagitis: Secondary | ICD-10-CM

## 2019-02-06 MED ORDER — NA SULFATE-K SULFATE-MG SULF 17.5-3.13-1.6 GM/177ML PO SOLN: 1.0000 | Freq: Once | ORAL | 0 refills | Status: AC

## 2019-02-06 MED ORDER — OMEPRAZOLE 40 MG PO CPDR: 40.0000 mg | DELAYED_RELEASE_CAPSULE | Freq: Two times a day (BID) | ORAL | 2 refills | Status: DC

## 2019-02-06 NOTE — Addendum Note (Signed)
Addended by: Dorethea Clan on: 02/06/2019 01:54 PM   Modules accepted: Orders

## 2019-02-06 NOTE — Progress Notes (Signed)
[name]  [address]  [place]  [place], [place] [address]  Main: [phone]  Fax: [phone]   Gastroenterology Consultation  Referring Provider:     Nobie Putnam * Primary Care Physician:  Olin Hauser, DO Reason for Consultation:     GERD        HPI:   Virtual Visit via video  Note  I connected with patient on 02/06/19 at  1:15 PM EDT by video  and verified that I am speaking with the correct person using two identifiers.   I discussed the limitations, risks, security and privacy concerns of performing an evaluation and management service by video and the availability of in person appointments. I also discussed with the patient that there may be a patient responsible charge related to this service. The patient expressed understanding and agreed to proceed.  Location of the patient: Home Location of provider: Home Participating persons: Patient and provider only   History of Present Illness: GERD   Joseph Wilcox is a 48 y.o. y/o male referred for consultation & management  by Dr. Parks Ranger, Devonne Doughty, DO.    He has seen Dr Marius Ditch in the past 2 years back. H/o GERD for over 7 years . 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 Didn't undergo testing at that time as he was on duty with the [employer].   He says that for a6-7 months when he vomits has some blood in it- notices when he eats too much , he has gained weight , he has some heart burn . Usually at night when he is asleep. Currently on omeprazole 20 mg - morning and evening before meals , it does help .   Never had a colonoscopy , no family history of colon cancer. No blood in the stool, no change in the shape of his stool.   Denies any overt blood loss.   Past Medical History:  Diagnosis Date  . Allergy   . Anxiety   . Depression   . GERD (gastroesophageal reflux disease)   . High cholesterol   . Hx of migraines   . Major depression,  recurrent, full remission ([place]) 10/09/2015    Past Surgical History:  Procedure Laterality Date  . FOOT SURGERY  2002   Bone spur  . TONSILLECTOMY      Prior to Admission medications   Medication Sig Start Date End Date Taking? Authorizing Provider  amLODipine (NORVASC) 10 MG tablet Take 1 tablet (10 mg total) by mouth daily. 12/29/18   Karamalegos, Devonne Doughty, DO  ARIPiprazole (ABILIFY) 2 MG tablet daily 03/27/18   Rainey Pines, MD  DULoxetine (CYMBALTA) 60 MG capsule Take 1 capsule (60 mg total) by mouth daily. 03/27/18   Rainey Pines, MD  EDARBYCLOR 40-25 MG TABS TAKE 1 TABLET BY MOUTH DAILY 10/09/18   Karamalegos, Devonne Doughty, DO  meloxicam (MOBIC) 15 MG tablet Take 1 tablet (15 mg total) by mouth daily. 03/27/18   Lorin Picket, PA-C  omeprazole (PRILOSEC) 20 MG capsule Take 1 capsule (20 mg total) by mouth 2 (two) times daily before a meal. 12/29/18   Karamalegos, Devonne Doughty, DO  pravastatin (PRAVACHOL) 20 MG tablet Take 1 tablet (20 mg total) by mouth daily. 05/25/18   Olin Hauser, DO    Family History  Problem Relation Age of Onset  . Diabetes Mother   . Prostate cancer Neg Hx   . Colon cancer Neg Hx  Social History   Tobacco Use  . Smoking status: Former Smoker    Quit date: 07/05/2010    Years since quitting: 8.5  . Smokeless tobacco: Former Network engineer Use Topics  . Alcohol use: Yes    Alcohol/week: 0.0 standard drinks    Comment: Occasional   . Drug use: No    Allergies as of 02/06/2019  . (No Known Allergies)    Review of Systems:    All systems reviewed and negative except where noted in HPI. General Appearance:    Alert, cooperative, no distress, appears stated age  Head:    Normocephalic, without obvious abnormality, atraumatic  Eyes:    PERRL, conjunctiva/corneas clear,  Ears:    Grossly normal hearing    Neurologic:   Grossly appears normal     Observations/Objective:  Labs: CBC    Component Value Date/Time   WBC 9.6  05/15/2018 0812   RBC 5.80 05/15/2018 0812   HGB 12.6 (L) 05/15/2018 0812   HCT 38.6 05/15/2018 0812   PLT 367 05/15/2018 0812   MCV 66.6 (L) 05/15/2018 0812   MCH 21.7 (L) 05/15/2018 0812   MCHC 32.6 05/15/2018 0812   RDW 17.6 (H) 05/15/2018 0812   LYMPHSABS 3,043 05/15/2018 0812   EOSABS 144 05/15/2018 0812   BASOSABS 19 05/15/2018 0812   CMP     Component Value Date/Time   NA 139 05/15/2018 0812   K 3.8 05/15/2018 0812   CL 98 05/15/2018 0812   CO2 32 05/15/2018 0812   GLUCOSE 91 05/15/2018 0812   BUN 11 05/15/2018 0812   CREATININE 1.21 05/15/2018 0812   CALCIUM 9.4 05/15/2018 0812   PROT 7.1 05/15/2018 0812   ALBUMIN 4.3 02/21/2017 1600   AST 21 05/15/2018 0812   ALT 23 05/15/2018 0812   ALKPHOS 59 02/21/2017 1600   BILITOT 0.7 05/15/2018 0812   GFRNONAA 72 05/15/2018 0812   GFRAA 84 05/15/2018 0812    Imaging Studies: No results found.  Assessment and Plan:   Joseph Wilcox is a 48 y.o. y/o male has been referred for GERD. History of vomiting,heartburn at night , blood in vomitus. CBC in 2018/2019 shows iron deficiency , microcytic anemia and ellipsoid RBC. I suspect he has severe acid reflux and esophagitis and may also be the cause of anemia     Plan :   1. EGD+colonoscopy +/- capsule study of the small bowel  2. Increase omeprazole to 40 mg BID 3. GERD patient information  4. Check urine analysis , cbc, iron studies, b12,folate   I have discussed alternative options, risks & benefits,  which include, but are not limited to, bleeding, infection, perforation,respiratory complication & drug reaction.  The patient agrees with this plan & written consent will be obtained.       I discussed the assessment and treatment plan with the patient. The patient was provided an opportunity to ask questions and all were answered. The patient agreed with the plan and demonstrated an understanding of the instructions.   The patient was advised to call back or seek an  in-person evaluation if the symptoms worsen or if the condition fails to improve as anticipated.  Dr [name] MD,MRCP [facility]) Gastroenterology/Hepatology Pager: [phone]   Speech recognition software was used to dictate the above note.

## 2019-02-06 NOTE — Telephone Encounter (Signed)
Called pt to pre-chart for today's e-visit with Dr. Anna  Unable to contact. LVM to return call 

## 2019-02-08 ENCOUNTER — Ambulatory Visit: Admitting: Psychiatry

## 2019-02-10 LAB — CBC WITH DIFFERENTIAL/PLATELET
Basophils Absolute: 0 10*3/uL (ref 0.0–0.2)
Basos: 0 %
EOS (ABSOLUTE): 0.1 10*3/uL (ref 0.0–0.4)
Eos: 1 %
Hematocrit: 39 % (ref 37.5–51.0)
Hemoglobin: 12.6 g/dL — ABNORMAL LOW (ref 13.0–17.7)
Immature Grans (Abs): 0 10*3/uL (ref 0.0–0.1)
Immature Granulocytes: 0 %
Lymphocytes Absolute: 3.1 10*3/uL (ref 0.7–3.1)
Lymphs: 32 %
MCH: 22.3 pg — ABNORMAL LOW (ref 26.6–33.0)
MCHC: 32.3 g/dL (ref 31.5–35.7)
MCV: 69 fL — ABNORMAL LOW (ref 79–97)
Monocytes Absolute: 0.5 10*3/uL (ref 0.1–0.9)
Monocytes: 6 %
Neutrophils Absolute: 5.9 10*3/uL (ref 1.4–7.0)
Neutrophils: 61 %
Platelets: 386 10*3/uL (ref 150–450)
RBC: 5.65 x10E6/uL (ref 4.14–5.80)
RDW: 17.5 % — ABNORMAL HIGH (ref 11.6–15.4)
WBC: 9.6 10*3/uL (ref 3.4–10.8)

## 2019-02-10 LAB — URINALYSIS
Bilirubin, UA: NEGATIVE
Glucose, UA: NEGATIVE
Ketones, UA: NEGATIVE
Leukocytes,UA: NEGATIVE
Nitrite, UA: NEGATIVE
Protein,UA: NEGATIVE
RBC, UA: NEGATIVE
Specific Gravity, UA: 1.019 (ref 1.005–1.030)
Urobilinogen, Ur: 0.2 mg/dL (ref 0.2–1.0)
pH, UA: 6.5 (ref 5.0–7.5)

## 2019-02-10 LAB — IRON,TIBC AND FERRITIN PANEL
Ferritin: 35 ng/mL (ref 30–400)
Iron Saturation: 21 % (ref 15–55)
Iron: 80 ug/dL (ref 38–169)
Total Iron Binding Capacity: 387 ug/dL (ref 250–450)
UIBC: 307 ug/dL (ref 111–343)

## 2019-02-10 LAB — B12 AND FOLATE PANEL
Folate: 8.8 ng/mL (ref >3.0)
Vitamin B-12: 332 pg/mL (ref 232–1245)

## 2019-02-13 ENCOUNTER — Other Ambulatory Visit: Payer: Self-pay

## 2019-02-13 ENCOUNTER — Other Ambulatory Visit
Admission: RE | Admit: 2019-02-13 | Discharge: 2019-02-13 | Disposition: A | Discharge status: Home / Self Care | Source: Ambulatory Visit | Attending: Gastroenterology | Admitting: Gastroenterology

## 2019-02-13 DIAGNOSIS — Z20828 Contact with and (suspected) exposure to other viral communicable diseases: Secondary | ICD-10-CM | POA: Insufficient documentation

## 2019-02-13 DIAGNOSIS — Z01812 Encounter for preprocedural laboratory examination: Secondary | ICD-10-CM | POA: Diagnosis not present

## 2019-02-13 LAB — SARS CORONAVIRUS 2 (TAT 6-24 HRS): SARS Coronavirus 2: NEGATIVE

## 2019-02-14 ENCOUNTER — Telehealth: Payer: Self-pay

## 2019-02-14 NOTE — Telephone Encounter (Signed)
Patient called back to let Sherald Hess know his Primary has been changed.

## 2019-02-14 NOTE — Telephone Encounter (Signed)
Pt has agreed to reschedule procedure to 03-09-19 to give insurance time to process referrals. Both referrals have been faxed to [facility]. I will contact pre-service to inform.

## 2019-02-14 NOTE — Telephone Encounter (Signed)
Called pt to inform him that we received a call from pre-service stating that pt insurance will not completely cover pt endoscopy procedure due to referral issue and pt being active [employer]. We spoke with pt insurance,[employer], and were informed that pt would need to contact [employer] to change the pcp that they have on file to his current pcp. We will then need to fax the (pcp referral to [facility]) to [employer] as well as the GI procedure referral.  Unable to contact, LVM to return call  [employer]  Ph: [phone]   UVQ:[phone]

## 2019-02-21 ENCOUNTER — Other Ambulatory Visit: Payer: Self-pay

## 2019-02-21 ENCOUNTER — Telehealth: Payer: Self-pay

## 2019-02-21 DIAGNOSIS — D509 Iron deficiency anemia, unspecified: Secondary | ICD-10-CM

## 2019-02-21 NOTE — Telephone Encounter (Signed)
Spoke with pt and informed him of lab results and Dr. Georgeann Oppenheim instructions. Pt understands and agrees.

## 2019-02-21 NOTE — Telephone Encounter (Signed)
Called pt to inform him of lab results and Dr. Anna's recommendations.  Unable to contact, LVM to return call 

## 2019-02-21 NOTE — Telephone Encounter (Signed)
-----   Message from [name], MD sent at 02/18/2019 12:09 PM EDT ----- Sherald Hess inform labs show iron is normal along with Hb but has microcytosis - check for copper levels , Hb electrophoresis   Dr [name] MD,MRCP [facility]) Gastroenterology/Hepatology Pager: [phone]

## 2019-02-21 NOTE — Telephone Encounter (Signed)
-----   Message from [name], MD sent at 02/18/2019 12:09 PM EDT ----- Sherald Hess inform labs show iron is normal along with Hb but has microcytosis - check for copper levels , Hb electrophoresis   Dr [name] MD,MRCP [facility]) Gastroenterology/Hepatology Pager: [phone]

## 2019-02-26 ENCOUNTER — Ambulatory Visit: Admitting: Psychiatry

## 2019-02-28 LAB — HEMOGLOBINOPATHY EVALUATION
HGB C: 0 %
HGB S: 0 %
HGB VARIANT: 71.5 % — ABNORMAL HIGH
Hemoglobin A2 Quantitation: 5.6 % — ABNORMAL HIGH (ref 1.8–3.2)
Hemoglobin F Quantitation: 1.4 % (ref 0.0–2.0)
Hgb A: 21.5 % — ABNORMAL LOW (ref 96.4–98.8)

## 2019-02-28 LAB — COPPER, SERUM: Copper: 104 ug/dL (ref 72–166)

## 2019-03-05 ENCOUNTER — Telehealth: Payer: Self-pay | Admitting: Gastroenterology

## 2019-03-05 NOTE — Telephone Encounter (Signed)
Returned pt call to remind him of the date and time he'll go for the COVID test. Unable to contact, left a detailed VM.

## 2019-03-05 NOTE — Telephone Encounter (Signed)
Spoke with pt and reminded him that he'll need to go for his COVID test tomorrow pt is aware of location and time. I also informed pt that we are still waiting on the response from his insurance for the approval of the procedure. I explained that we have re-faxed the referral information this morning and Kimmie in pre-service is calling to check the turn-around time for the response. Pt states he'll contact his insurance as well to find out if the procedure will be approved in time. Pt also asked about lab results I explained that Dr. Vicente Males has not reviewed the results yet and that I will call back once he has made his recommendations. Pt understands.

## 2019-03-05 NOTE — Telephone Encounter (Signed)
Patient called to see if he needs to have a covid test for his procedure on Friday 03-09-19. Please call.

## 2019-03-06 ENCOUNTER — Other Ambulatory Visit
Admission: RE | Admit: 2019-03-06 | Discharge: 2019-03-06 | Disposition: A | Discharge status: Home / Self Care | Source: Ambulatory Visit | Attending: Gastroenterology | Admitting: Gastroenterology

## 2019-03-06 ENCOUNTER — Other Ambulatory Visit: Payer: Self-pay

## 2019-03-06 DIAGNOSIS — Z20828 Contact with and (suspected) exposure to other viral communicable diseases: Secondary | ICD-10-CM | POA: Diagnosis not present

## 2019-03-06 DIAGNOSIS — Z01812 Encounter for preprocedural laboratory examination: Secondary | ICD-10-CM | POA: Insufficient documentation

## 2019-03-06 LAB — SARS CORONAVIRUS 2 (TAT 6-24 HRS): SARS Coronavirus 2: NEGATIVE

## 2019-03-06 NOTE — Telephone Encounter (Signed)
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  This would explain the smaller size of his red blood cells seen on his last full blood count.  This is common in African-Americans.  C/c Olin Hauser, DO

## 2019-03-07 ENCOUNTER — Other Ambulatory Visit: Payer: Self-pay | Admitting: Psychiatry

## 2019-03-07 DIAGNOSIS — F419 Anxiety disorder, unspecified: Secondary | ICD-10-CM

## 2019-03-08 ENCOUNTER — Encounter: Payer: Self-pay | Admitting: *Deleted

## 2019-03-08 ENCOUNTER — Other Ambulatory Visit: Payer: Self-pay

## 2019-03-08 ENCOUNTER — Ambulatory Visit (INDEPENDENT_AMBULATORY_CARE_PROVIDER_SITE_OTHER): Admitting: Psychiatry

## 2019-03-08 DIAGNOSIS — F331 Major depressive disorder, recurrent, moderate: Secondary | ICD-10-CM

## 2019-03-08 NOTE — Progress Notes (Signed)
[facility] Initial Adult Exam  Name: Joseph Wilcox Date: 03/08/2019 MRN: [id number] DOB: [birth year] PCP: Olin Hauser, DO  Time spent: 60 minutes     8:00am to 9:00am   Guardian/Payee:  patient  Paperwork requested:  No    Reason for Visit /Presenting Problem/ Symptoms:  Sadness, anxiety, work issues, family issues  Mental Status Exam:   Appearance:   Casual     Behavior:  Appropriate and Sharing  Motor:  Normal  Speech/Language:   Clear and Coherent  Affect:  anxious  Mood:  sad and anxious  Thought process:  normal  Thought content:    WNL  Sensory/Perceptual disturbances:    WNL  Orientation:  oriented to person, place, time/date, situation, day of week, month of year and year  Attention:  Good  Concentration:  Good  Memory:  WNL  Fund of knowledge:   Good  Insight:    Good  Judgment:   Good  Impulse Control:  Good   Reported Symptoms:  See above symptoms  Risk Assessment: Danger to Self:  No Self-injurious Behavior: No Danger to Others: No Duty to Warn:no Physical Aggression / Violence:No  Access to Firearms a concern: No  Gang Involvement:No  Patient / guardian was educated about steps to take if suicide or homicide risk level increases between visits: Patient denies any SI or HI. While future psychiatric events cannot be accurately predicted, the patient does not currently require acute inpatient psychiatric care and does not currently meet [facility] involuntary commitment criteria.  Substance Abuse History: Current substance abuse: No     Past Psychiatric History:   Previous psychological history is significant for anxiety Outpatient Providers:not sure of name; in [place], [place] History of Psych Hospitalization: No  Psychological Testing: none   Abuse History: Victim of No., n/a   Report needed: No. Victim of Neglect:No. Perpetrator of n/a  Witness / Exposure to Domestic Violence: No   Protective Services Involvement:  No  Witness to [employer] Violence:  No   Family History:  Family History  Problem Relation Age of Onset  . Diabetes Mother   . Prostate cancer Neg Hx   . Colon cancer Neg Hx     Living situation: the patient lives with their spouse  Sexual Orientation:  Straight  Relationship Status: married  Name of spouse / other: n/a             If a parent, number of children / ages: 1 child with wife who is a 23 yr old son, 2 other children by a different person and they are both 46 yr old.  He states he is close to all three kids.  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  I'm a Research officer, trade union. Looks for what may go wrong verus right.  Doubt myself and my abilities.  Legal History: Pending legal issue / charges: The patient has no significant history of legal issues. History  of legal issue / charges: n/a  Medical History/Surgical History:Reviewed with patient and he confirms that the information below is correct. Past Medical History:  Diagnosis Date  . Allergy   . Anxiety   . Depression   . GERD (gastroesophageal reflux disease)   . High cholesterol   . Hx of migraines   . Major depression, recurrent, full remission ([employer] [place]) 10/09/2015    Past Surgical History:  Procedure Laterality Date  . FOOT SURGERY  2002   Bone spur  . TONSILLECTOMY       Medications: Current Outpatient Medications  Medication Sig Dispense Refill  . amLODipine (NORVASC) 10 MG tablet Take 1 tablet (10 mg total) by mouth daily. 90 tablet 1  . ARIPiprazole (ABILIFY) 2 MG tablet daily 90 tablet 1  . DULoxetine (CYMBALTA) 60 MG capsule Take 1 capsule (60 mg total) by mouth daily. 90 capsule 1  . EDARBYCLOR 40-25 MG TABS TAKE 1 TABLET BY MOUTH DAILY 90 tablet 1  . meloxicam (MOBIC) 15 MG tablet Take 1 tablet (15 mg total) by mouth daily. 30 tablet 0  . omeprazole (PRILOSEC) 20 MG capsule Take 1 capsule (20 mg total) by mouth 2 (two) times daily before a meal. 180 capsule 1  . omeprazole (PRILOSEC) 40 MG capsule Take 1 capsule (40 mg total) by mouth 2 (two) times daily. 60 capsule 2  . pravastatin (PRAVACHOL) 20 MG tablet Take 1 tablet (20 mg total) by mouth daily. 90 tablet 3   No current facility-administered medications for this visit.     No Known Allergies  Diagnoses:    ICD-10-CM   1. MDD (major depressive disorder), recurrent episode, moderate ([place])  F33.1     Plan of Care:   Patient is a 48 yr old married gentleman with 3 adult children by 2 different mother.  All 3 children live in local area and are close with patient. Married 20 yrs and there is some current stress in marriage. Due to a work situation, he is going to lose his job. His faith is strong and believes that "God won't let anything happen that I can't handle."  Treatment Goals:  Long Term Goal: 1. Develop healthy interpersonal relationships that lead to alleviation and help prevent relapse of depression. Strategy: Patient will intentionally seek to have more interpersonal relationships that  are healthy and support the alleviation of depression. (Progressing)  Short Term Goal: 2. Identify and replace depressive thinking that leads to feeling more depressed and leads to depressive actions. Strategy: Patient will replace negative and self-defeating self-talk with more realistic  and positive messages. (Progressing)  Next appt in approximately 2 weeks.  Shanon Ace, LCSW

## 2019-03-08 NOTE — Telephone Encounter (Signed)
Spoke with pt and informed him of lab results and Dr. Georgeann Oppenheim suggestions. Pt understands.

## 2019-03-09 ENCOUNTER — Ambulatory Visit: Admitting: Anesthesiology

## 2019-03-09 ENCOUNTER — Encounter: Payer: Self-pay | Admitting: *Deleted

## 2019-03-09 ENCOUNTER — Ambulatory Visit
Admission: RE | Admit: 2019-03-09 | Discharge: 2019-03-09 | Disposition: A | Discharge status: Home / Self Care | Attending: Gastroenterology | Admitting: Gastroenterology

## 2019-03-09 ENCOUNTER — Encounter
Admission: RE | Disposition: A | Discharge status: Home / Self Care | Payer: Self-pay | Source: Home / Self Care | Attending: Gastroenterology

## 2019-03-09 DIAGNOSIS — D509 Iron deficiency anemia, unspecified: Secondary | ICD-10-CM | POA: Insufficient documentation

## 2019-03-09 DIAGNOSIS — F3342 Major depressive disorder, recurrent, in full remission: Secondary | ICD-10-CM | POA: Diagnosis not present

## 2019-03-09 DIAGNOSIS — Z791 Long term (current) use of non-steroidal anti-inflammatories (NSAID): Secondary | ICD-10-CM | POA: Diagnosis not present

## 2019-03-09 DIAGNOSIS — K635 Polyp of colon: Secondary | ICD-10-CM | POA: Insufficient documentation

## 2019-03-09 DIAGNOSIS — K219 Gastro-esophageal reflux disease without esophagitis: Secondary | ICD-10-CM | POA: Diagnosis not present

## 2019-03-09 DIAGNOSIS — D125 Benign neoplasm of sigmoid colon: Secondary | ICD-10-CM | POA: Diagnosis not present

## 2019-03-09 DIAGNOSIS — K2950 Unspecified chronic gastritis without bleeding: Secondary | ICD-10-CM | POA: Diagnosis not present

## 2019-03-09 DIAGNOSIS — Z79899 Other long term (current) drug therapy: Secondary | ICD-10-CM | POA: Diagnosis not present

## 2019-03-09 DIAGNOSIS — E7800 Pure hypercholesterolemia, unspecified: Secondary | ICD-10-CM | POA: Diagnosis not present

## 2019-03-09 DIAGNOSIS — F419 Anxiety disorder, unspecified: Secondary | ICD-10-CM | POA: Insufficient documentation

## 2019-03-09 DIAGNOSIS — Z87891 Personal history of nicotine dependence: Secondary | ICD-10-CM | POA: Insufficient documentation

## 2019-03-09 HISTORY — PX: ESOPHAGOGASTRODUODENOSCOPY (EGD) WITH PROPOFOL: SHX5813

## 2019-03-09 HISTORY — PX: COLONOSCOPY WITH PROPOFOL: SHX5780

## 2019-03-09 SURGERY — COLONOSCOPY WITH PROPOFOL
Anesthesia: General

## 2019-03-09 MED ORDER — PROPOFOL 10 MG/ML IV BOLUS
INTRAVENOUS | Status: DC | PRN
  Administered 2019-03-09: 20 mg via INTRAVENOUS
  Administered 2019-03-09: 30 mg via INTRAVENOUS
  Administered 2019-03-09: 50 mg via INTRAVENOUS

## 2019-03-09 MED ORDER — PROPOFOL 500 MG/50ML IV EMUL
INTRAVENOUS | Status: DC | PRN
  Administered 2019-03-09: 100 ug/kg/min via INTRAVENOUS

## 2019-03-09 MED ORDER — SODIUM CHLORIDE 0.9 % IV SOLN
INTRAVENOUS | Status: DC
  Administered 2019-03-09: 11:00:00 via INTRAVENOUS

## 2019-03-09 MED ORDER — PROPOFOL 500 MG/50ML IV EMUL
INTRAVENOUS | Status: AC
  Filled 2019-03-09: qty 50

## 2019-03-09 NOTE — Op Note (Signed)
[facility]astroenterology Patient Name: Joseph Wilcox Procedure Date: 03/09/2019 12:22 PM MRN: [id number] Account #: [id number] Date of Birth: 08-13-1970 Admit Type: Outpatient Age: 48 Room: [facility] ENDO ROOM 4 Gender: Male Note Status: Finalized Procedure:            Upper GI endoscopy Indications:          Iron deficiency anemia Providers:            [name] MD, MD Referring MD:         Olin Hauser (Referring MD) Medicines:            Monitored Anesthesia Care Complications:        No immediate complications. Procedure:            Pre-Anesthesia Assessment:                       - Prior to the procedure, a History and Physical was                        performed, and patient medications, allergies and                        sensitivities were reviewed. The patient's tolerance of                        previous anesthesia was reviewed.                       - The risks and benefits of the procedure and the                        sedation options and risks were discussed with the                        patient. All questions were answered and informed                        consent was obtained.                       - ASA Grade Assessment: II - A patient with mild                        systemic disease.                       After obtaining informed consent, the endoscope was                        passed under direct vision. Throughout the procedure,                        the patient's blood pressure, pulse, and oxygen                        saturations were monitored continuously. The Endoscope                        was introduced through the mouth, and advanced to the  third part of duodenum. The upper GI endoscopy was                        accomplished with ease. The patient tolerated the                        procedure well. Findings:      The esophagus was normal.      The entire examined stomach was normal.  Biopsies were taken with a cold       forceps for histology.      The examined duodenum was normal. Biopsies for histology were taken with       a cold forceps for evaluation of celiac disease. Impression:           - Normal esophagus.                       - Normal stomach. Biopsied.                       - Normal examined duodenum. Biopsied. Recommendation:       - Await pathology results.                       - Perform a colonoscopy today. Procedure Code(s):    --- Professional ---                       [phone], Esophagogastroduodenoscopy, flexible, transoral;                        with biopsy, single or multiple Diagnosis Code(s):    --- Professional ---                       D50.9, Iron deficiency anemia, unspecified CPT copyright 2019 American Medical Association. All rights reserved. The codes documented in this report are preliminary and upon coder review may  be revised to meet current compliance requirements. [name], MD [name] MD, MD 03/09/2019 12:42:52 PM This report has been signed electronically. Number of Addenda: 0 Note Initiated On: 03/09/2019 12:22 PM      [facility]

## 2019-03-09 NOTE — H&P (Signed)
[name], MD [address] [place], [place], [place], [address] [address], [place], [place], [place], [address] Phone: [phone]  Fax: [phone]  Primary Care Physician:  Olin Hauser, DO   Pre-Procedure History & Physical: HPI:  Joseph Wilcox is a 48 y.o. male is here for an endoscopy and colonoscopy    Past Medical History:  Diagnosis Date  . Allergy   . Anxiety   . Depression   . GERD (gastroesophageal reflux disease)   . High cholesterol   . Hx of migraines   . Major depression, recurrent, full remission ([place]) 10/09/2015    Past Surgical History:  Procedure Laterality Date  . FOOT SURGERY  2002   Bone spur  . TONSILLECTOMY      Prior to Admission medications   Medication Sig Start Date End Date Taking? Authorizing Provider  amLODipine (NORVASC) 10 MG tablet Take 1 tablet (10 mg total) by mouth daily. 12/29/18  Yes Karamalegos, Devonne Doughty, DO  ARIPiprazole (ABILIFY) 2 MG tablet daily 03/27/18  Yes Rainey Pines, MD  DULoxetine (CYMBALTA) 60 MG capsule Take 1 capsule (60 mg total) by mouth daily. 03/27/18  Yes Rainey Pines, MD  EDARBYCLOR 40-25 MG TABS TAKE 1 TABLET BY MOUTH DAILY 10/09/18  Yes Karamalegos, Devonne Doughty, DO  meloxicam (MOBIC) 15 MG tablet Take 1 tablet (15 mg total) by mouth daily. 03/27/18  Yes Lorin Picket, PA-C  omeprazole (PRILOSEC) 20 MG capsule Take 1 capsule (20 mg total) by mouth 2 (two) times daily before a meal. 12/29/18  Yes Karamalegos, Devonne Doughty, DO  pravastatin (PRAVACHOL) 20 MG tablet Take 1 tablet (20 mg total) by mouth daily. 05/25/18  Yes Karamalegos, Devonne Doughty, DO  omeprazole (PRILOSEC) 40 MG capsule Take 1 capsule (40 mg total) by mouth 2 (two) times daily. 02/06/19   [name], MD    Allergies as of 02/06/2019  . (No Known Allergies)    Family History  Problem Relation Age of Onset  . Diabetes Mother   . Prostate cancer Neg Hx   . Colon cancer Neg Hx     Social History   Socioeconomic  History  . Marital status: Unknown    Spouse name: Not on file  . Number of children: Not on file  . Years of education: Not on file  . Highest education level: Not on file  Occupational History  . Not on file  Social Needs  . Financial resource strain: Not on file  . Food insecurity    Worry: Not on file    Inability: Not on file  . Transportation needs    Medical: Not on file    Non-medical: Not on file  Tobacco Use  . Smoking status: Former Smoker    Quit date: 07/05/2010    Years since quitting: 8.6  . Smokeless tobacco: Former Network engineer and Sexual Activity  . Alcohol use: Yes    Alcohol/week: 0.0 standard drinks    Comment: Occasional   . Drug use: No  . Sexual activity: Yes    Partners: Female    Birth control/protection: None  Lifestyle  . Physical activity    Days per week: Not on file    Minutes per session: Not on file  . Stress: Not on file  Relationships  . Social Herbalist on phone: Not on file    Gets together: Not on file    Attends religious service: Not on file  Active member of club or organization: Not on file    Attends meetings of clubs or organizations: Not on file    Relationship status: Not on file  . Intimate partner violence    Fear of current or ex partner: Not on file    Emotionally abused: Not on file    Physically abused: Not on file    Forced sexual activity: Not on file  Other Topics Concern  . Not on file  Social History Narrative  . Not on file    Review of Systems: See HPI, otherwise negative ROS  Physical Exam: BP (!) 150/87   Pulse 87   Temp (!) 96.9 F (36.1 C)   Resp 17   Ht 6\' 1"  (1.854 m)   Wt 116.1 kg   SpO2 99%   BMI 33.78 kg/m  General:   Alert,  pleasant and cooperative in NAD Head:  Normocephalic and atraumatic. Neck:  Supple; no masses or thyromegaly. Lungs:  Clear throughout to auscultation, normal respiratory effort.    Heart:  +S1, +S2, Regular rate and rhythm, No edema. Abdomen:   Soft, nontender and nondistended. Normal bowel sounds, without guarding, and without rebound.   Neurologic:  Alert and  oriented x4;  grossly normal neurologically.  Impression/Plan: Joseph Wilcox is here for an endoscopy and colonoscopy  to be performed for  evaluation of iron deficiency anemia    Risks, benefits, limitations, and alternatives regarding endoscopy have been reviewed with the patient.  Questions have been answered.  All parties agreeable.   [name], MD  03/09/2019, 12:20 PM

## 2019-03-09 NOTE — Op Note (Signed)
[facility] Gastroenterology Patient Name: Joseph Wilcox Procedure Date: 03/09/2019 12:21 PM MRN: [id number] Account #: [id number] Date of Birth: 09/24/1970 Admit Type: Outpatient Age: 48 Room: [facility] ENDO ROOM 4 Gender: Male Note Status: Finalized Procedure:            Colonoscopy Indications:          Iron deficiency anemia Providers:            [name] MD, MD Referring MD:         Olin Hauser (Referring MD) Medicines:            Monitored Anesthesia Care Complications:        No immediate complications. Procedure:            Pre-Anesthesia Assessment:                       - Prior to the procedure, a History and Physical was                        performed, and patient medications, allergies and                        sensitivities were reviewed. The patient's tolerance of                        previous anesthesia was reviewed.                       - The risks and benefits of the procedure and the                        sedation options and risks were discussed with the                        patient. All questions were answered and informed                        consent was obtained.                       - ASA Grade Assessment: II - A patient with mild                        systemic disease.                       After obtaining informed consent, the colonoscope was                        passed under direct vision. Throughout the procedure,                        the patient's blood pressure, pulse, and oxygen                        saturations were monitored continuously. The                        Colonoscope was introduced through the anus and                        advanced  to the the cecum, identified by the                        appendiceal orifice. The colonoscopy was performed with                        ease. The patient tolerated the procedure well. The                        quality of the bowel preparation was  excellent. Findings:      The perianal and digital rectal examinations were normal.      A 8 mm polyp was found in the sigmoid colon. The polyp was sessile. The       polyp was removed with a cold snare. Resection and retrieval were       complete.      The exam was otherwise without abnormality on direct and retroflexion       views. Impression:           - One 8 mm polyp in the sigmoid colon, removed with a                        cold snare. Resected and retrieved.                       - The examination was otherwise normal on direct and                        retroflexion views. Recommendation:       - Discharge patient to home (with escort).                       - Resume previous diet.                       - Continue present medications.                       - Await pathology results.                       - Repeat colonoscopy for surveillance based on                        pathology results. Procedure Code(s):    --- Professional ---                       [phone], Colonoscopy, flexible; with removal of tumor(s),                        polyp(s), or other lesion(s) by snare technique Diagnosis Code(s):    --- Professional ---                       K63.5, Polyp of colon                       D50.9, Iron deficiency anemia, unspecified CPT copyright 2019 American Medical Association. All rights reserved. The codes documented in this report are preliminary and upon coder review may  be revised to meet current compliance requirements. [name], MD [name] MD, MD 03/09/2019 1:02:25 PM  This report has been signed electronically. Number of Addenda: 0 Note Initiated On: 03/09/2019 12:21 PM Scope Withdrawal Time: 0 hours 8 minutes 8 seconds  Total Procedure Duration: 0 hours 11 minutes 5 seconds  Estimated Blood Loss: Estimated blood loss: none.      [facility]

## 2019-03-09 NOTE — Anesthesia Preprocedure Evaluation (Addendum)
Anesthesia Evaluation  Patient identified by MRN, date of birth, ID band Patient awake    Reviewed: Allergy & Precautions, H&P , NPO status , Patient's Chart, lab work & pertinent test results  Airway Mallampati: III  TM Distance: >3 FB     Dental  (+) Teeth Intact   Pulmonary neg shortness of breath, sleep apnea and Continuous Positive Airway Pressure Ventilation , neg COPD, neg recent URI, former smoker,           Cardiovascular hypertension, (-) angina(-) Past MI and (-) Cardiac Stents (-) dysrhythmias      Neuro/Psych PSYCHIATRIC DISORDERS Anxiety Depression negative neurological ROS     GI/Hepatic Neg liver ROS, GERD  Controlled,  Endo/Other  negative endocrine ROS  Renal/GU negative Renal ROS  negative genitourinary   Musculoskeletal   Abdominal   Peds  Hematology negative hematology ROS (+)   Anesthesia Other Findings Obese  Past Medical History: No date: Allergy No date: Anxiety No date: Depression No date: GERD (gastroesophageal reflux disease) No date: High cholesterol No date: Hx of migraines 10/09/2015: Major depression, recurrent, full remission ([place])  Past Surgical History: 2002: FOOT SURGERY     Comment:  Bone spur No date: TONSILLECTOMY  BMI    Body Mass Index: 33.78 kg/m      Reproductive/Obstetrics negative OB ROS                            Anesthesia Physical Anesthesia Plan  ASA: II  Anesthesia Plan: General   Post-op Pain Management:    Induction:   PONV Risk Score and Plan: Propofol infusion and TIVA  Airway Management Planned: Natural Airway and Nasal Cannula  Additional Equipment:   Intra-op Plan:   Post-operative Plan:   Informed Consent: I have reviewed the patients History and Physical, chart, labs and discussed the procedure including the risks, benefits and alternatives for the proposed anesthesia with the patient or authorized  representative who has indicated his/her understanding and acceptance.     Dental Advisory Given  Plan Discussed with: Anesthesiologist and CRNA  Anesthesia Plan Comments:         Anesthesia Quick Evaluation

## 2019-03-09 NOTE — Transfer of Care (Signed)
Immediate Anesthesia Transfer of Care Note  Patient: Joseph Wilcox  Procedure(s) Performed: COLONOSCOPY WITH PROPOFOL (N/A ) ESOPHAGOGASTRODUODENOSCOPY (EGD) WITH PROPOFOL (N/A )  Patient Location: PACU  Anesthesia Type:General  Level of Consciousness: awake, alert  and oriented  Airway & Oxygen Therapy: Patient Spontanous Breathing  Post-op Assessment: Report given to RN and Post -op Vital signs reviewed and stable  Post vital signs: Reviewed and stable  Last Vitals:  Vitals Value Taken Time  BP 111/77 03/09/19 1306  Temp 36.7 C 03/09/19 1305  Pulse 73 03/09/19 1307  Resp 24 03/09/19 1307  SpO2 99 % 03/09/19 1307  Vitals shown include unvalidated device data.  Last Pain:  Vitals:   03/09/19 1305  PainSc: 0-No pain         Complications: No apparent anesthesia complications

## 2019-03-09 NOTE — Anesthesia Post-op Follow-up Note (Signed)
Anesthesia QCDR form completed.        

## 2019-03-09 NOTE — Anesthesia Postprocedure Evaluation (Signed)
Anesthesia Post Note  Patient: Joseph Wilcox  Procedure(s) Performed: COLONOSCOPY WITH PROPOFOL (N/A ) ESOPHAGOGASTRODUODENOSCOPY (EGD) WITH PROPOFOL (N/A )  Patient location during evaluation: PACU Anesthesia Type: General Level of consciousness: awake and alert Pain management: pain level controlled Vital Signs Assessment: post-procedure vital signs reviewed and stable Respiratory status: spontaneous breathing, nonlabored ventilation and respiratory function stable Cardiovascular status: blood pressure returned to baseline and stable Postop Assessment: no apparent nausea or vomiting Anesthetic complications: no     Last Vitals:  Vitals:   03/09/19 1315 03/09/19 1325  BP: 117/85 114/90  Pulse: 70 72  Resp: 15 14  Temp:    SpO2: 100% 100%    Last Pain:  Vitals:   03/09/19 1325  PainSc: 0-No pain                 Durenda Hurt

## 2019-03-13 LAB — SURGICAL PATHOLOGY

## 2019-03-21 ENCOUNTER — Other Ambulatory Visit: Payer: Self-pay

## 2019-03-21 ENCOUNTER — Ambulatory Visit (INDEPENDENT_AMBULATORY_CARE_PROVIDER_SITE_OTHER): Admitting: Psychiatry

## 2019-03-21 DIAGNOSIS — F331 Major depressive disorder, recurrent, moderate: Secondary | ICD-10-CM | POA: Diagnosis not present

## 2019-03-21 NOTE — Progress Notes (Addendum)
      [facility] Counselor/Therapist Progress Note  Patient ID: Joseph Wilcox, MRN: [id number],    Date: 03/21/2019   Time Spent: 60 minutes       8:00am to 9:00am  Treatment Type: Individual Therapy  Reported Symptoms:   Anxiety, depression, some insomnia, anger, frustration  Mental Status Exam:  Appearance:   Neat     Behavior:  Appropriate and Sharing  Motor:  Normal  Speech/Language:   Clear and Coherent  Affect:  anxious, depressed, some tearfulness  Mood:  anxious and depressed  Thought process:  normal  Thought content:    WNL  Sensory/Perceptual disturbances:    WNL  Orientation:  oriented to person, place, time/date, situation, day of week, month of year and year  Attention:  Good  Concentration:  Good  Memory:  WNL  Fund of knowledge:   Good  Insight:    Good  Judgment:   Good  Impulse Control:  Good   Risk Assessment: Danger to Self:  No Self-injurious Behavior: No Danger to Others: No Duty to Warn:no Physical Aggression / Violence:No  Access to Firearms a concern: No  Gang Involvement:No   Subjective:  Patient more depressed "just thinking about his situation at work, the uncertainty, feels unfair."  Getting up in the morning is hard as he immediately thinks about his work situation and has lots of negative self-talk. " Feels like a failure, can't support my family."  Has had prior thoughts of self-harm but denies any at this time.  States he would not do that to his family. Processes his feelings of failure and disappointment but trying not to get stuck in negative thoughts.  Hurtful reminders are problematic and fed by negative thoughts. Self-forgiveness needed and forgiveness of other people needed in this situation in order to move forward.  Interventions: Cognitive Behavioral Therapy and Ego-Supportive  Diagnosis:   ICD-10-CM   1. MDD (major depressive disorder), recurrent episode, moderate (HCC)  F33.1     Plan:   Treatment Goals: * Goals  remain the same and progress in being updated below. We reviewed goals today and talked about progress noted below.  Long Term Goal: 1. Develop healthy interpersonal relationships that lead to alleviation and help prevent relapse of depression. Strategy: Patient will intentionally seek to have more interpersonal relationships that  are healthy and support the alleviation of depression. Progressing- Patient is reaching out to at least 1 person at this point and talked about a few others he feels would be supportive as well.  Wife is supportive.  Other family are not aware of situation yet.  Short Term Goal: 2. Identify and replace depressive thinking that leads to feeling more depressed and leads to depressive actions. Strategy: Patient will replace negative and self-defeating self-talk with more realistic and positive messages. Progressing: Patient worked in session today on negative thoughts that are feeding his depression and anxiety.  He was able to share several and beginning to understand how they are hurting him.  Will be working to interrupt the thoughts and trying replace with more hopeful messages to himself.   Next appt within 2 weeks.   Shanon Ace, LCSW

## 2019-03-25 ENCOUNTER — Encounter: Payer: Self-pay | Admitting: Gastroenterology

## 2019-03-27 ENCOUNTER — Telehealth: Payer: Self-pay

## 2019-03-27 NOTE — Telephone Encounter (Signed)
Called pt to inform him of Dr. Georgeann Oppenheim recommendation to refer pt to hematology.  Unable to contact, Left detailed VM to return call or to wait to discuss with Dr. Vicente Males during his follow up tomorrow (03-28-19).

## 2019-03-27 NOTE — Telephone Encounter (Signed)
-----   Message from [name], MD sent at 03/26/2019  3:16 PM EDT ----- Joseph Wilcox   Please refer to Dr. Tasia Catchings as discussed.  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  I have discussed with Dr. Tasia Catchings and she will be happy to see him  C/c Parks Ranger, Devonne Doughty, DO   Dr [name] MD,MRCP [facility]) Gastroenterology/Hepatology Pager: [phone]

## 2019-03-28 ENCOUNTER — Ambulatory Visit (INDEPENDENT_AMBULATORY_CARE_PROVIDER_SITE_OTHER): Admitting: Gastroenterology

## 2019-03-28 ENCOUNTER — Other Ambulatory Visit: Payer: Self-pay

## 2019-03-28 VITALS — BP 148/88 | HR 93 | Temp 98.4°F | Ht 73.0 in | Wt 295.6 lb

## 2019-03-28 DIAGNOSIS — D509 Iron deficiency anemia, unspecified: Secondary | ICD-10-CM | POA: Diagnosis not present

## 2019-03-28 DIAGNOSIS — K219 Gastro-esophageal reflux disease without esophagitis: Secondary | ICD-10-CM | POA: Diagnosis not present

## 2019-03-28 DIAGNOSIS — D582 Other hemoglobinopathies: Secondary | ICD-10-CM | POA: Diagnosis not present

## 2019-03-28 NOTE — Progress Notes (Signed)
[name] MD, MRCP(U.K) [address]  [place]  [place], [place] [address]  Main: [phone]  Fax: [phone]   Primary Care Physician: Olin Hauser, DO  Primary Gastroenterologist:  Dr. [name]   Follow-up for iron deficiency anemia  HPI: Joseph Wilcox is a 48 y.o. male   Summary of history :  He was initially seen by myself on 02/06/2019 when he is referred for GERD.Marland Kitchen H/o GERD for over 7 years . 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 Didn't undergo testing at that time as he was on duty with the [employer]. He has gained weight , he has some heart burn . Usually at night when he is asleep. Currently on omeprazole 20 mg - morning and evening before meals , it does help .   Interval history   02/06/2019-03/28/2019  03/09/2019: EGD: Normal.  Normal mucosa of the duodenum on biopsy.  Nonspecific chronic gastritis seen negative H. pylori.  Colonoscopy: 8 mm polyp in the sigmoid colon resected which was hyperplastic.  02/23/2019: Abnormality seen in hemoglobinopathy evaluation.  Did not fit the pattern of thalassemia completely as per my assessment.  Copper, B12 folate, iron studies normal  Since last visit he has been doing really well with no symptoms of acid reflux.  He is on Prilosec 40 mg twice daily.  He has been enforcing lifestyle changes.  Current Outpatient Medications  Medication Sig Dispense Refill  . amLODipine (NORVASC) 10 MG tablet Take 1 tablet (10 mg total) by mouth daily. 90 tablet 1  . ARIPiprazole (ABILIFY) 2 MG tablet daily 90 tablet 1  . DULoxetine (CYMBALTA) 60 MG capsule Take 1 capsule (60 mg total) by mouth daily. 90 capsule 1  . EDARBYCLOR 40-25 MG TABS TAKE 1 TABLET BY MOUTH DAILY 90 tablet 1  . meloxicam (MOBIC) 15 MG tablet Take 1 tablet (15 mg total) by mouth daily. 30 tablet 0  . omeprazole (PRILOSEC) 20 MG capsule Take 1 capsule (20 mg total) by mouth 2 (two) times daily before a meal. 180 capsule 1   . omeprazole (PRILOSEC) 40 MG capsule Take 1 capsule (40 mg total) by mouth 2 (two) times daily. 60 capsule 2  . pravastatin (PRAVACHOL) 20 MG tablet Take 1 tablet (20 mg total) by mouth daily. 90 tablet 3   No current facility-administered medications for this visit.     Allergies as of 03/28/2019  . (No Known Allergies)    ROS:  General: Negative for anorexia, weight loss, fever, chills, fatigue, weakness. ENT: Negative for hoarseness, difficulty swallowing , nasal congestion. CV: Negative for chest pain, angina, palpitations, dyspnea on exertion, peripheral edema.  Respiratory: Negative for dyspnea at rest, dyspnea on exertion, cough, sputum, wheezing.  GI: See history of present illness. GU:  Negative for dysuria, hematuria, urinary incontinence, urinary frequency, nocturnal urination.  Endo: Negative for unusual weight change.    Physical Examination:   There were no vitals taken for this visit.  General: Well-nourished, well-developed in no acute distress.  Eyes: No icterus. Conjunctivae pink. Mouth: Oropharyngeal mucosa moist and pink , no lesions erythema or exudate. Lungs: Clear to auscultation bilaterally. Non-labored. Heart: Regular rate and rhythm, no murmurs rubs or gallops.  Abdomen: Bowel sounds are normal, nontender, nondistended, no hepatosplenomegaly or masses, no abdominal bruits or hernia , no rebound or guarding.   Extremities: No lower extremity edema. No clubbing or deformities. Neuro: Alert and oriented x 3.  Grossly intact. Skin: Warm and  dry, no jaundice.   Psych: Alert and cooperative, normal mood and affect.   Imaging Studies: No results found.  Assessment and Plan:   FLORENZ GORT is a 48 y.o. y/o male here to follow-up for GERD .  Since last visit his symptoms have resolved and he is on omeprazole 40 mg twice daily .  CBC in 2018/2019 shows iron deficiency , microcytic anemia and ellipsoid RBC.  EGD and colonoscopy did not show any source  for iron deficiency anemia.  I repeated iron studies, B12, folate, copper which were all normal.  Hemoglobin electrophoresis revealed an abnormality which I am personally not able to completely interpret myself.  I did discuss with Dr. 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    Plan :   1. I do not think he needs a capsule study of the small bowel at this point of time.  His hemoglobin is almost in the normal range with normal iron studies.  I would like to see him after he is seen seen by Dr. Tasia Catchings and if she feels this capsule study of the small bowel is warranted I would be happy to perform the same 2. Decrease dose of omeprazole to 20 mg twice daily.  Continue to strictly enforced lifestyle changes.  At the next visit in 4 months if he still doing well I will probably decrease his omeprazole to 20 mg at night.   Dr [name]  MD,MRCP [facility] Follow up in 4 months

## 2019-03-30 ENCOUNTER — Ambulatory Visit (INDEPENDENT_AMBULATORY_CARE_PROVIDER_SITE_OTHER): Admitting: Family Medicine

## 2019-03-30 ENCOUNTER — Encounter: Payer: Self-pay | Admitting: Family Medicine

## 2019-03-30 ENCOUNTER — Other Ambulatory Visit: Payer: Self-pay

## 2019-03-30 VITALS — BP 135/82 | HR 80 | Temp 99.2°F | Resp 16 | Ht 72.0 in | Wt 293.0 lb

## 2019-03-30 DIAGNOSIS — M5431 Sciatica, right side: Secondary | ICD-10-CM | POA: Diagnosis not present

## 2019-03-30 DIAGNOSIS — F331 Major depressive disorder, recurrent, moderate: Secondary | ICD-10-CM

## 2019-03-30 DIAGNOSIS — F411 Generalized anxiety disorder: Secondary | ICD-10-CM | POA: Diagnosis not present

## 2019-03-30 DIAGNOSIS — Z23 Encounter for immunization: Secondary | ICD-10-CM | POA: Diagnosis not present

## 2019-03-30 MED ORDER — GABAPENTIN 100 MG PO CAPS: ORAL_CAPSULE | ORAL | 1 refills | Status: DC

## 2019-03-30 MED ORDER — MELOXICAM 15 MG PO TABS: 15.0000 mg | ORAL_TABLET | Freq: Every day | ORAL | 0 refills | Status: DC | PRN

## 2019-03-30 MED ORDER — PREDNISONE 20 MG PO TABS: ORAL_TABLET | ORAL | 0 refills | Status: DC

## 2019-03-30 NOTE — Progress Notes (Signed)
Subjective:    Patient ID: Joseph Wilcox, male    DOB: 11-16-1970, 48 y.o.   MRN: [id number]  Joseph Wilcox is a 48 y.o. male presenting on 03/30/2019 for Numbness (sharp pain bilateral but Right is worst and now left side is starting as per patient )   HPI   RIGHT SCIATICA / Right Leg Pain Reports symptoms started 3-4 weeks ago, with R lower extremity leg pain initially was more episodic pain aching with numbness and tingling and sharp pains worse at night more episodic, then worsening soreness pain during the day then still worse at night with shooting radiating pain down the leg from hip to his knee, now more constant and worsening over few weeks. Causing pain to touch and worse with walking. He describes with  - Some pains starting to happen   Chronic Depression, recurrent, moderate Active again, was in remission, stopped Psych and weaned off meds, now with COVID and anxiety and mood with depression again. He will return to Psychiatry, counseling therapy in [place] - Was on Abilify and Duloxetine in past, not anymore   Health Maintenance: Due for Flu Shot, will receive today    Depression screen [facility] 2/9 03/30/2019 12/29/2018 12/01/2018  Decreased Interest 1 0 0  Down, Depressed, Hopeless 2 0 0  PHQ - 2 Score 3 0 0  Altered sleeping 2 0 0  Tired, decreased energy 2 1 0  Change in appetite 2 0 0  Feeling bad or failure about yourself  2 0 0  Trouble concentrating 2 0 0  Moving slowly or fidgety/restless 0 0 0  Suicidal thoughts 1 0 0  PHQ-9 Score 14 1 0  Difficult doing work/chores Somewhat difficult Not difficult at all Not difficult at all  Some recent data might be hidden   [place]-Suicide Severity Rating Scale 1) Have you wished you were dead or wished you could go to sleep and not wake up? - Yes  2) Have you had any actual thoughts of killing yourself? - No  Skip questions 3,4, 5  6) Have you ever done anything, started to do anything, or prepared to do  anything to end your life? - No   Social History   Tobacco Use  . Smoking status: Former Smoker    Quit date: 07/05/2010    Years since quitting: 8.7  . Smokeless tobacco: Former Network engineer Use Topics  . Alcohol use: Yes    Alcohol/week: 0.0 standard drinks    Comment: Occasional   . Drug use: No    Review of Systems Per HPI unless specifically indicated above     Objective:    BP 135/82   Pulse 80   Temp 99.2 F (37.3 C) (Oral)   Resp 16   Ht 6' (1.829 m)   Wt 293 lb (132.9 kg)   BMI 39.74 kg/m   Wt Readings from Last 3 Encounters:  03/30/19 293 lb (132.9 kg)  03/28/19 295 lb 9.6 oz (134.1 kg)  03/09/19 256 lb (116.1 kg)    Physical Exam Vitals signs and nursing note reviewed.  Constitutional:      General: He is not in acute distress.    Appearance: He is well-developed. He is not diaphoretic.     Comments: Well-appearing, comfortable, cooperative  HENT:     Head: Normocephalic and atraumatic.  Eyes:     General:        Right eye: No discharge.  Left eye: No discharge.     Conjunctiva/sclera: Conjunctivae normal.  Cardiovascular:     Rate and Rhythm: Normal rate.  Pulmonary:     Effort: Pulmonary effort is normal.  Musculoskeletal:     Comments: Low Back Inspection: Normal appearance, Large body habitus, no spinal deformity, symmetrical. Palpation: No tenderness over spinous processes. Bilateral lumbar paraspinal muscles non-tender and without hypertonicity/spasm. ROM: Full active ROM forward flex / back extension, rotation L/R without discomfort  Right Hip full active ROM flex ext, rotation. Pain with Hip extension and SLR  Special Testing: Seated SLR positive for radicular pain R side. Negative L  Strength: Bilateral hip flex/ext 5/5, knee flex/ext 5/5, ankle dorsiflex/plantarflex 5/5 Neurovascular: intact distal sensation to light touch   Skin:    General: Skin is warm and dry.     Findings: No erythema or rash.  Neurological:      Mental Status: He is alert and oriented to person, place, and time.  Psychiatric:        Behavior: Behavior normal.     Comments: Well groomed, good eye contact, normal speech and thoughts    Results for orders placed or performed during the hospital encounter of 03/09/19  Surgical pathology  Result Value Ref Range   SURGICAL PATHOLOGY      Surgical Pathology CASE: [id number] PATIENT: [facility] [place] Surgical Pathology Report     SPECIMEN SUBMITTED: A. Duodenum, r/o celiac; cbx B. Stomach, r/o h pylori; cbx C. Colon polyp, sigmoid; cold snare  CLINICAL HISTORY: None provided  PRE-OPERATIVE DIAGNOSIS: Gastroesophageal reflux disease K21.9, iron deficiency anemia D50.9  POST-OPERATIVE DIAGNOSIS: Colon polyp, normal EGD     DIAGNOSIS: A. DUODENUM; MUCOSAL BIOPSY: - UNREMARKABLE DUODENAL MUCOSA. - NEGATIVE FOR FEATURES OF CELIAC DISEASE. - NEGATIVE FOR DYSPLASIA AND MALIGNANCY.  B. STOMACH; COLD BIOPSY: - CHRONIC NONSPECIFIC GASTRITIS. - NEGATIVE FOR ACTIVE INFLAMMATION AND H PYLORI. - NEGATIVE FOR INTESTINAL METAPLASIA, DYSPLASIA, AND MALIGNANCY.  C. COLON POLYP, SIGMOID; COLD SNARE: - HYPERPLASTIC POLYP. - NEGATIVE FOR DYSPLASIA AND MALIGNANCY.   GROSS DESCRIPTION: A. Labeled: C BX duodenal to rule out celiac Received: In formalin Tissue fragment(s): 3 Size: 0.1-0.2 cm Descrip tion: Tan soft tissue fragments Entirely submitted in 1 cassette.  B. Labeled: C BX gastric to rule out H. pylori Received: In formalin Tissue fragment(s): 5 Size: From 0.2-0.4 cm Description: Tan soft tissue fragments Entirely submitted in 1 cassette.  C. Labeled: Cold snare sigmoid colon polyp Received: In formalin Tissue fragment(s): 2 Size: 0.4 and 0.5 cm Description: Tan soft tissue fragments Entirely submitted in 1 cassette.   Final Diagnosis performed by Betsy Pries, MD.   Electronically signed 03/13/2019 12:59:55PM The electronic signature indicates that the  named Attending Pathologist has evaluated the specimen  Technical component performed at [facility], [address], [place], [place] [address] Lab: [phone] Dir: Rush Farmer, MD, MMM  Professional component performed at [facility], [facility], [place], [place], [place] [address] Lab: [phone] Dir: Dellia Nims. Rubinas, MD       Assessment & Plan:   Problem List Items Addressed This Visit    Generalized anxiety disorder   Major depressive disorder, recurrent, moderate (HCC)    Worsening now, with reactivated depression, anxiety chronic problem Life stressors, COVID pandemic Off medications now - lost to follow-up [facility]  Resume care with Behavioral health - [facility] - has apt for Psych / therapy       Other Visit Diagnoses    Sciatica of right side without back  pain    -  Primary   Relevant Medications   meloxicam (MOBIC) 15 MG tablet   predniSONE (DELTASONE) 20 MG tablet   gabapentin (NEURONTIN) 100 MG capsule   Needs flu shot       Relevant Orders   Flu Vaccine QUAD 36+ mos IM (Completed)      Acute to subacute R > L Sciatica without clear back pain but has some R hip pain. Without known injury or trauma. Unclear etiology. Positive SLR R sided - Inadequate conservative therapy   Plan: 1. Start Prednisone taper over 7 days for sciatica 2. Transition to NSAID Meloxicam 15mg  daily PRN 1-2 weeks after Prednisone 3. Start Gabapentin titration 100-300mg  nightly and possibly during day if need, caution sedation 4. Future offer muscle relaxant if additional therapy needed 5. May use Tylenol PRN for breakthrough 6. Encouraged use of heating pad 1-2x daily for now then PRN 7.  Follow-up 4-6 weeks if not improved for re-evaluation, consider X-ray imaging, trial of PT, and possibly referral to Orthopedic   Meds ordered this encounter  Medications  . meloxicam (MOBIC) 15 MG tablet    Sig: Take 1 tablet (15 mg total) by mouth daily as needed for  pain.    Dispense:  30 tablet    Refill:  0  . predniSONE (DELTASONE) 20 MG tablet    Sig: Take daily with food. Start with 60mg  (3 pills) x 2 days, then reduce to 40mg  (2 pills) x 2 days, then 20mg  (1 pill) x 3 days    Dispense:  13 tablet    Refill:  0  . gabapentin (NEURONTIN) 100 MG capsule    Sig: Start 1 capsule daily, increase by 1 cap every 2-3 days as tolerated up to 3 times a day, or may take 3 at once in evening.    Dispense:  90 capsule    Refill:  1      Follow up plan: Return in about 4 weeks (around 04/27/2019), or if symptoms worsen or fail to improve, for sciatica.   Joseph Wilcox, [facility] 03/30/2019, 9:38 AM

## 2019-03-30 NOTE — Patient Instructions (Addendum)
Thank you for coming to the office today.  Flu shot today  1. For your Back Pain - I think that this is due to Muscle Spasms or strain. Your Sciatic Nerve can be affected causing some of your radiation and numbness down your legs.  - Start Prednisone taper (steroid anti-inflammatory) for nerve irritation with pain in legs. Taper down over 7 days. Do not take any Ibuprofen or Aleve while taking the Prednisone.  - Once finished Prednisone, then start with other anti-inflammatory MELOXICAM one pill with food daily as needed  Start Gabapentin 100mg  capsules, take at night for 2-3 nights only, and then increase to 2 times a day for a few days, and then may increase to 3 times a day, it may make you drowsy, if helps significantly at night only, then you can increase instead to 3 capsules at night, instead of 3 times a day - In the future if needed, we can significantly increase the dose if tolerated well, some common doses are 300mg  three times a day up to 600mg  three times a day, usually it takes several weeks or months to get to higher doses  May use Tylenol Extra Str 500mg  tabs - may take 1-2 tablets every 6 hours as needed  Recommend to start using heating pad on your lower back 1-2x daily for few weeks  Also try a Wedge Seat Cushion to avoid nerve pinching when sitting prolonged period of time.  This pain may take weeks to months to fully resolve, but hopefully it will respond to the medicine initially. All back injuries (small or serious) are slow to heal since we use our back muscles every day. Be careful with turning, twisting, lifting, sitting / standing for prolonged periods, and avoid re-injury.  If your symptoms significantly worsen with more pain, or new symptoms with weakness in one or both legs, new or different shooting leg pains, numbness in legs or groin, loss of control or retention of urine or bowel movements, please call back for advice and you may need to go directly to the  Emergency Department.   Please schedule a Follow-up Appointment to: Return in about 4 weeks (around 04/27/2019), or if symptoms worsen or fail to improve, for sciatica.  If you have any other questions or concerns, please feel free to call the office or send a message through [place]. You may also schedule an earlier appointment if necessary.  Additionally, you may be receiving a survey about your experience at our office within a few days to 1 week by e-mail or mail. We value your feedback.  Nobie Putnam, DO [facility], [facility]             Low Back Pain Exercises  See other page with pictures of each exercise.  Start with 1 or 2 of these exercises that you are most comfortable with. Do not do any exercises that cause you significant worsening pain. Some of these may cause some "stretching soreness" but it should go away after you stop the exercise, and get better over time. Gradually increase up to 3-4 exercises as tolerated.  Standing hamstring stretch: Place the heel of your leg on a stool about 15 inches high. Keep your knee straight. Lean forward, bending at the hips until you feel a mild stretch in the back of your thigh. Make sure you do not roll your shoulders and bend at the waist when doing this or you will stretch your lower back instead. Hold the stretch  for 15 to 30 seconds. Repeat 3 times. Repeat the same stretch on your other leg.  Cat and camel: Get down on your hands and knees. Let your stomach sag, allowing your back to curve downward. Hold this position for 5 seconds. Then arch your back and hold for 5 seconds. Do 3 sets of 10.  Quadriped Arm/Leg Raises: Get down on your hands and knees. Tighten your abdominal muscles to stiffen your spine. While keeping your abdominals tight, raise one arm and the opposite leg away from you. Hold this position for 5 seconds. Lower your arm and leg slowly and alternate sides. Do this 10 times on each  side.  Pelvic tilt: Lie on your back with your knees bent and your feet flat on the floor. Tighten your abdominal muscles and push your lower back into the floor. Hold this position for 5 seconds, then relax. Do 3 sets of 10.  Partial curl: Lie on your back with your knees bent and your feet flat on the floor. Tighten your stomach muscles and flatten your back against the floor. Tuck your chin to your chest. With your hands stretched out in front of you, curl your upper body forward until your shoulders clear the floor. Hold this position for 3 seconds. Don't hold your breath. It helps to breathe out as you lift your shoulders up. Relax. Repeat 10 times. Build to 3 sets of 10. To challenge yourself, clasp your hands behind your head and keep your elbows out to the side.  Lower trunk rotation: Lie on your back with your knees bent and your feet flat on the floor. Tighten your abdominal muscles and push your lower back into the floor. Keeping your shoulders down flat, gently rotate your legs to one side, then the other as far as you can. Repeat 10 to 20 times.  Single knee to chest stretch: Lie on your back with your legs straight out in front of you. Bring one knee up to your chest and grasp the back of your thigh. Pull your knee toward your chest, stretching your buttock muscle. Hold this position for 15 to 30 seconds and return to the starting position. Repeat 3 times on each side.  Double knee to chest: Lie on your back with your knees bent and your feet flat on the floor. Tighten your abdominal muscles and push your lower back into the floor. Pull both knees up to your chest. Hold for 5 seconds and repeat 10 to 20 times.     Sciatica Rehab Ask your health care provider which exercises are safe for you. Do exercises exactly as told by your health care provider and adjust them as directed. It is normal to feel mild stretching, pulling, tightness, or discomfort as you do these exercises. Stop right  away if you feel sudden pain or your pain gets worse. Do not begin these exercises until told by your health care provider. Stretching and range-of-motion exercises These exercises warm up your muscles and joints and improve the movement and flexibility of your hips and back. These exercises also help to relieve pain, numbness, and tingling. Sciatic nerve glide 1. Sit in a chair with your head facing down toward your chest. Place your hands behind your back. Let your shoulders slump forward. 2. Slowly straighten one of your legs while you tilt your head back as if you are looking toward the ceiling. Only straighten your leg as far as you can without making your symptoms worse. 3. Hold this  position for __________ seconds. 4. Slowly return to the starting position. 5. Repeat with your other leg. Repeat __________ times. Complete this exercise __________ times a day. Knee to chest with hip adduction and internal rotation  1. Lie on your back on a firm surface with both legs straight. 2. Bend one of your knees and move it up toward your chest until you feel a gentle stretch in your lower back and buttock. Then, move your knee toward the shoulder that is on the opposite side from your leg. This is hip adduction and internal rotation. ? Hold your leg in this position by holding on to the front of your knee. 3. Hold this position for __________ seconds. 4. Slowly return to the starting position. 5. Repeat with your other leg. Repeat __________ times. Complete this exercise __________ times a day. Prone extension on elbows  1. Lie on your abdomen on a firm surface. A bed may be too soft for this exercise. 2. Prop yourself up on your elbows. 3. Use your arms to help lift your chest up until you feel a gentle stretch in your abdomen and your lower back. ? This will place some of your body weight on your elbows. If this is uncomfortable, try stacking pillows under your chest. ? Your hips should stay  down, against the surface that you are lying on. Keep your hip and back muscles relaxed. 4. Hold this position for __________ seconds. 5. Slowly relax your upper body and return to the starting position. Repeat __________ times. Complete this exercise __________ times a day. Strengthening exercises These exercises build strength and endurance in your back. Endurance is the ability to use your muscles for a long time, even after they get tired. Pelvic tilt This exercise strengthens the muscles that lie deep in the abdomen. 1. Lie on your back on a firm surface. Bend your knees and keep your feet flat on the floor. 2. Tense your abdominal muscles. Tip your pelvis up toward the ceiling and flatten your lower back into the floor. ? To help with this exercise, you may place a small towel under your lower back and try to push your back into the towel. 3. Hold this position for __________ seconds. 4. Let your muscles relax completely before you repeat this exercise. Repeat __________ times. Complete this exercise __________ times a day. Alternating arm and leg raises  1. Get on your hands and knees on a firm surface. If you are on a hard floor, you may want to use padding, such as an exercise mat, to cushion your knees. 2. Line up your arms and legs. Your hands should be directly below your shoulders, and your knees should be directly below your hips. 3. Lift your left leg behind you. At the same time, raise your right arm and straighten it in front of you. ? Do not lift your leg higher than your hip. ? Do not lift your arm higher than your shoulder. ? Keep your abdominal and back muscles tight. ? Keep your hips facing the ground. ? Do not arch your back. ? Keep your balance carefully, and do not hold your breath. 4. Hold this position for __________ seconds. 5. Slowly return to the starting position. 6. Repeat with your right leg and your left arm. Repeat __________ times. Complete this  exercise __________ times a day. Posture and body mechanics Good posture and healthy body mechanics can help to relieve stress in your body's tissues and joints. Body mechanics refers  to the movements and positions of your body while you do your daily activities. Posture is part of body mechanics. Good posture means:  Your spine is in its natural S-curve position (neutral).  Your shoulders are pulled back slightly.  Your head is not tipped forward. Follow these guidelines to improve your posture and body mechanics in your everyday activities. Standing   When standing, keep your spine neutral and your feet about hip width apart. Keep a slight bend in your knees. Your ears, shoulders, and hips should line up.  When you do a task in which you stand in one place for a long time, place one foot up on a stable object that is 2-4 inches (5-10 cm) high, such as a footstool. This helps keep your spine neutral. Sitting   When sitting, keep your spine neutral and keep your feet flat on the floor. Use a footrest, if necessary, and keep your thighs parallel to the floor. Avoid rounding your shoulders, and avoid tilting your head forward.  When working at a desk or a computer, keep your desk at a height where your hands are slightly lower than your elbows. Slide your chair under your desk so you are close enough to maintain good posture.  When working at a computer, place your monitor at a height where you are looking straight ahead and you do not have to tilt your head forward or downward to look at the screen. Resting  When lying down and resting, avoid positions that are most painful for you.  If you have pain with activities such as sitting, bending, stooping, or squatting, lie in a position in which your body does not bend very much. For example, avoid curling up on your side with your arms and knees near your chest (fetal position).  If you have pain with activities such as standing for a long  time or reaching with your arms, lie with your spine in a neutral position and bend your knees slightly. Try the following positions: ? Lying on your side with a pillow between your knees. ? Lying on your back with a pillow under your knees. Lifting   When lifting objects, keep your feet at least shoulder width apart and tighten your abdominal muscles.  Bend your knees and hips and keep your spine neutral. It is important to lift using the strength of your legs, not your back. Do not lock your knees straight out.  Always ask for help to lift heavy or awkward objects. This information is not intended to replace advice given to you by your health care provider. Make sure you discuss any questions you have with your health care provider. Document Released: 06/21/2005 Document Revised: 10/13/2018 Document Reviewed: 07/13/2018 Elsevier Patient Education  2020 [employer].

## 2019-03-30 NOTE — Assessment & Plan Note (Signed)
Worsening now, with reactivated depression, anxiety chronic problem Life stressors, COVID pandemic Off medications now - lost to follow-up [facility]  Resume care with Behavioral health - [facility] - has apt for Psych / therapy

## 2019-04-04 ENCOUNTER — Ambulatory Visit (INDEPENDENT_AMBULATORY_CARE_PROVIDER_SITE_OTHER): Admitting: Psychiatry

## 2019-04-04 ENCOUNTER — Other Ambulatory Visit: Payer: Self-pay

## 2019-04-04 DIAGNOSIS — F331 Major depressive disorder, recurrent, moderate: Secondary | ICD-10-CM | POA: Diagnosis not present

## 2019-04-04 NOTE — Progress Notes (Signed)
      [facility] Counselor/Therapist Progress Note  Patient ID: RUDIE SERVEN, MRN: [id number],    Date: 04/04/2019  Time Spent: 60 minutes     8:00am to 9:00am  Treatment Type: Individual Therapy  Reported Symptoms:  Anxiety, depression   Mental Status Exam:  Appearance:   Casual     Behavior:  Appropriate and Sharing  Motor:  Normal  Speech/Language:   Normal Rate  Affect:  Depressed and anxious  Mood:  anxious and depressed  Thought process:  goal directed  Thought content:    WNL  Sensory/Perceptual disturbances:    WNL  Orientation:  oriented to person, place, time/date, situation, day of week, month of year and year  Attention:  Good  Concentration:  Good  Memory:  WNL  Fund of knowledge:   Good  Insight:    Good  Judgment:   Good  Impulse Control:  Good   Risk Assessment: Danger to Self:  No Self-injurious Behavior: No Danger to Others: No Duty to Warn:no Physical Aggression / Violence:No  Access to Firearms a concern: No  Gang Involvement:No   Subjective:  Patient in today with anxiety and depression, with depression being more prominent. Denies any SI. Wife remains supportive. Normal things he would enjoy doing, I don't seem to enjoy now.  Appetite decreased.  Sleep is a problem--I can fall asleep but have trouble staying asleep, and is tired upon waking up.  Occasional episodes of tearfulness, almost daily.  Feb. 24th is to be his last day at current job. Feeling like a failure and has let family down. Negative self-talk, worries a lot, feeling badly about self, self-forgiveness needed. He and wife do typically spend quality time together on weekends.     Interventions: Cognitive Behavioral Therapy  Diagnosis:   ICD-10-CM   1. MDD (major depressive disorder), recurrent episode, moderate ([place])  F33.1      Plan:  Patient not signing tx plan on computer screen due to [place].    Treatment Goals: * Goals remain the same and progress in being updated  below each session. We reviewed goals today and talked about progress noted below.  Long Term Goal: 1. Develop healthy interpersonal relationships that lead to alleviation and help prevent relapse of depression.  Strategy: Patient will intentionally seek to have more interpersonal relationships that are healthy and support the alleviation of depression.  Progressing- Patient making progress in reaching out to people (including 1 additional person)  1 person connected with his worksite, 1 person from his church, and his family. Also gains support from some sites online that include motivational info, spiritual development, and prayers, all of which he feels are helpful.   Short Term Goal: 2. Identify and replace depressive thinking that leads to feeling more depressed and leads to depressive actions.  Strategy: Patient will replace negative and self-defeating self-talk with more realistic and positive messages.  Progressing: More work today on self-forgiveness which is essential for patient and he recognizes this. Discussed how this challenges him and what he needs to do in being open to forgiveness. Made some notes on his phone to remind him of self-forgiveness strategies, decreasing his worrying, interrupting negative self-talk and replacing it with more positive, self-affirming talk.   To be seen by med provider for medication eval later this week. Return appt for therapy within 2 wks.   Shanon Ace, LCSW

## 2019-04-06 ENCOUNTER — Encounter: Payer: Self-pay | Admitting: Psychiatry

## 2019-04-06 ENCOUNTER — Ambulatory Visit (INDEPENDENT_AMBULATORY_CARE_PROVIDER_SITE_OTHER): Admitting: Psychiatry

## 2019-04-06 ENCOUNTER — Other Ambulatory Visit: Payer: Self-pay

## 2019-04-06 VITALS — BP 136/79 | HR 80 | Ht 73.0 in | Wt 176.0 lb

## 2019-04-06 DIAGNOSIS — F331 Major depressive disorder, recurrent, moderate: Secondary | ICD-10-CM

## 2019-04-06 DIAGNOSIS — G4700 Insomnia, unspecified: Secondary | ICD-10-CM | POA: Diagnosis not present

## 2019-04-06 DIAGNOSIS — F411 Generalized anxiety disorder: Secondary | ICD-10-CM | POA: Diagnosis not present

## 2019-04-06 MED ORDER — ARIPIPRAZOLE 2 MG PO TABS: 2.0000 mg | ORAL_TABLET | Freq: Every day | ORAL | 0 refills | Status: DC

## 2019-04-06 MED ORDER — TEMAZEPAM 15 MG PO CAPS: 15.0000 mg | ORAL_CAPSULE | Freq: Every evening | ORAL | 1 refills | Status: DC | PRN

## 2019-04-06 MED ORDER — DULOXETINE HCL 30 MG PO CPEP: ORAL_CAPSULE | ORAL | 0 refills | Status: DC

## 2019-04-06 MED ORDER — DULOXETINE HCL 60 MG PO CPEP: 60.0000 mg | ORAL_CAPSULE | Freq: Every day | ORAL | 1 refills | Status: DC

## 2019-04-06 NOTE — Progress Notes (Signed)
[facility] MD/PA/NP Initial Note  04/06/2019 11:36 AM Joseph Wilcox  MRN:  [id number]  Chief Complaint:  Chief Complaint    Depression; Anxiety; Insomnia      HPI: Patient is a 48 year old male being seen for initial evaluation for anxiety and depression after being referred by his therapist, Rinaldo Cloud, LCSW.  Patient reports that he has been experiencing some recurrence of depression, anxiety, and insomnia in the last few months.  Patient reports that he ran out of medications for depression and anxiety during the pandemic.  He reports that he reached out to psychiatrist for refill and did not receive a response and has not followed up with previous psychiatrist.  He reports that he has recently started therapy with Rinaldo Cloud, LCSW in this office and wishes to establish psychiatric medication management here as well.  He reports having anxiety and depression in the past. Anxiety started about 3 years ago in the context of severe work stressors.  He reports that 3 years ago he was experiencing severe anxiety and some depressive signs and symptoms.  Reports that he had anxiety attacks at that time and initially anxiety manifested as impaired concentration, irritation, crying, chest pains, and feeling "like I was always scared... startled." He reports that he had times during episodes of acute anxiety where he thought he was going to die and needed to leave.   He has recently had some feelings that he would like to flee. Has had some recent worry. Denies h/o long-standing worry. Some catastrophic thinking and rumination. He reports that he has never liked crowds but will not necessarily avoid them. Has anxiety with some public speaking and some social gatherings.   Persistent sad mood since not having medication a few months ago. He reports some irritability. Denies difficulty falling asleep and will awaken a few hours later and is awake for a few hours before he can return to sleep. He  estimates sleeping 4 hours a night on average and has increased caffeine consumption. He reports.  that he eats on a schedule but does not have a desire to eat or craving any foods. He reports that he has lost interest and enjoyment in most things. Energy and motivation have been low and feels as if he could stay in bed all day. He reports some difficulty with concentration and is having to redirect thoughts. He reports some excessive feelings of guilt. He reports some passive death wishes. Denies SI.   Denies any past depressive episodes prior to 3 years ago.  Patient reports that 3 years ago his anxiety was his chief complaint and he experienced some depression, and currently he identifies depression as his primary complaint and anxiety as secondary.  Patient has had some insomnia currently and 3 years ago.  Denies any past manic s/s. Denies periods of decreased need for sleep and usually needs 8 hours of sleep a night.   Denies AH, VH, or paranoia.   Was dx'd and tx'd for ADD in childhood.   Born in [place] and raised in [place]. Has 2 half-brothers and was raised as an only child. He reports father was not involved in his likfe. Grew up with mom, 2 aunts, and grandparents. Grandfather was minister and describes not feeling accepted by him. Has had some college. 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 TIS:20 years. 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 Not in combat. Denies any trauma s/s r/t deployment. He reports current work stress. Married x  20 years and reports that they have a good relationship. He has 3 children- 36, 60 and 28 yo. He reports that his children are all heathy and doing well. Wife is primary support. He used to enjoy going to book stores and traveling.    Past Psychiatric Medication Trials: Duloxetine- Was taking 60 mg. Helpful for both anxiety and depression. Denies any long-term side effects. Initially had a "tickling, fluttering" sensation in  chest.  Abilify- Was taking 2 mg po qd.  Gabapentin- Being prescribed for sciatic nerve pain. Has not noticed any effect on anxiety Ritalin- Took in childhood for ADD.  Temazepam Trazodone- Nightmares  Visit Diagnosis:    ICD-10-CM   1. Generalized anxiety disorder  F41.1 DULoxetine (CYMBALTA) 30 MG capsule    DULoxetine (CYMBALTA) 60 MG capsule  2. MDD (major depressive disorder), recurrent episode, moderate (HCC)  F33.1 DULoxetine (CYMBALTA) 30 MG capsule    DULoxetine (CYMBALTA) 60 MG capsule    ARIPiprazole (ABILIFY) 2 MG tablet  3. Insomnia, unspecified type  G47.00 temazepam (RESTORIL) 15 MG capsule    Past Psychiatric History: Has recently started seeing Rinaldo Cloud, LCSW for therapy. Was seeing Dr. Gretel Acre for a few years at [facility]. 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   Past Medical History:  Past Medical History:  Diagnosis Date  . Allergy   . Anxiety   . Depression   . GERD (gastroesophageal reflux disease)   . High cholesterol   . Hx of migraines     Past Surgical History:  Procedure Laterality Date  . COLONOSCOPY WITH PROPOFOL N/A 03/09/2019   Procedure: COLONOSCOPY WITH PROPOFOL;  Surgeon: [name], MD;  Location: [facility] ENDOSCOPY;  Service: Gastroenterology;  Laterality: N/A;  . ESOPHAGOGASTRODUODENOSCOPY (EGD) WITH PROPOFOL N/A 03/09/2019   Procedure: ESOPHAGOGASTRODUODENOSCOPY (EGD) WITH PROPOFOL;  Surgeon: [name], MD;  Location: [facility] ENDOSCOPY;  Service: Gastroenterology;  Laterality: N/A;  . FOOT SURGERY  2002   Bone spur  . TONSILLECTOMY      Family Psychiatric History: No known family psychiatric hx.   Family History:  Family History  Problem Relation Age of Onset  . Diabetes Mother   . Prostate cancer Neg Hx   . Colon cancer Neg Hx     Social History:  Social History   Socioeconomic History  . Marital status: Unknown    Spouse name: Not on file  . Number of  children: Not on file  . Years of education: Not on file  . Highest education level: Not on file  Occupational History  . Not on file  Social Needs  . Financial resource strain: Not on file  . Food insecurity    Worry: Not on file    Inability: Not on file  . Transportation needs    Medical: Not on file    Non-medical: Not on file  Tobacco Use  . Smoking status: Former Smoker    Quit date: 07/05/2010    Years since quitting: 8.7  . Smokeless tobacco: Former Network engineer and Sexual Activity  . Alcohol use: Yes    Alcohol/week: 0.0 standard drinks    Comment: Occasional   . Drug use: No  . Sexual activity: Yes    Partners: Female    Birth control/protection: None  Lifestyle  . Physical activity    Days per week: Not on file    Minutes per session: Not on file  . Stress: Not on file  Relationships  .  Social Herbalist on phone: Not on file    Gets together: Not on file    Attends religious service: Not on file    Active member of club or organization: Not on file    Attends meetings of clubs or organizations: Not on file    Relationship status: Not on file  Other Topics Concern  . Not on file  Social History Narrative  . Not on file    Allergies: No Known Allergies  Metabolic Disorder Labs: Lab Results  Component Value Date   HGBA1C 5.9 (A) 12/29/2018   MPG 123 05/15/2018   No results found for: PROLACTIN Lab Results  Component Value Date   CHOL 306 (H) 05/15/2018   TRIG 217 (H) 05/15/2018   HDL 53 05/15/2018   CHOLHDL 5.8 (H) 05/15/2018   LDLCALC 212 (H) 05/15/2018   Lab Results  Component Value Date   TSH 1.28 05/15/2018    Therapeutic Level Labs: No results found for: LITHIUM No results found for: VALPROATE No components found for:  CBMZ  Current Medications: Current Outpatient Medications  Medication Sig Dispense Refill  . amLODipine (NORVASC) 10 MG tablet Take 1 tablet (10 mg total) by mouth daily. 90 tablet 1  . EDARBYCLOR  40-25 MG TABS TAKE 1 TABLET BY MOUTH DAILY 90 tablet 1  . gabapentin (NEURONTIN) 100 MG capsule Start 1 capsule daily, increase by 1 cap every 2-3 days as tolerated up to 3 times a day, or may take 3 at once in evening. 90 capsule 1  . meloxicam (MOBIC) 15 MG tablet Take 1 tablet (15 mg total) by mouth daily as needed for pain. 30 tablet 0  . omeprazole (PRILOSEC) 20 MG capsule Take 1 capsule (20 mg total) by mouth 2 (two) times daily before a meal. 180 capsule 1  . pravastatin (PRAVACHOL) 20 MG tablet Take 1 tablet (20 mg total) by mouth daily. 90 tablet 3  . ARIPiprazole (ABILIFY) 2 MG tablet Take 1 tablet (2 mg total) by mouth daily. 30 tablet 0  . DULoxetine (CYMBALTA) 30 MG capsule Take 1 capsule po q am x 1 week, then 2 capsules po q am 30 capsule 0  . [START ON 04/20/2019] DULoxetine (CYMBALTA) 60 MG capsule Take 1 capsule (60 mg total) by mouth daily. 30 capsule 1  . predniSONE (DELTASONE) 20 MG tablet Take daily with food. Start with 60mg  (3 pills) x 2 days, then reduce to 40mg  (2 pills) x 2 days, then 20mg  (1 pill) x 3 days (Patient not taking: Reported on 04/06/2019) 13 tablet 0  . temazepam (RESTORIL) 15 MG capsule Take 1 capsule (15 mg total) by mouth at bedtime as needed for sleep. 30 capsule 1   No current facility-administered medications for this visit.     Medication Side Effects: none  Orders placed this visit:  No orders of the defined types were placed in this encounter.   Psychiatric Specialty Exam:  Review of Systems  Constitutional: Negative.   HENT: Negative.   Eyes: Negative.   Respiratory: Negative.   Cardiovascular: Negative.   Gastrointestinal: Negative.   Genitourinary: Negative.   Musculoskeletal: Negative.        Shoulder pain. Sciatic pain. Occ foot pain.  Skin: Negative.   Neurological: Negative.   Endo/Heme/Allergies: Negative.   Psychiatric/Behavioral:       Please refer to HPI    Blood pressure 136/79, pulse 80, height 6\' 1"  (1.854 m), weight  176 lb (79.8 kg).Body mass index  is 23.22 kg/m.  General Appearance: Casual, Neat and Well Groomed  Eye Contact:  Good  Speech:  Clear and Coherent and Normal Rate  Volume:  Normal  Mood:  Anxious and Depressed  Affect:  Constricted, Depressed and Anxious  Thought Process:  Coherent, Linear and Descriptions of Associations: Intact  Orientation:  Full (Time, Place, and Person)  Thought Content: Logical and Hallucinations: None   Suicidal Thoughts:  No  Homicidal Thoughts:  No  Memory:  WNL  Judgement:  Good  Insight:  Good  Psychomotor Activity:  Normal  Concentration:  Concentration: Good  Recall:  Good  Fund of Knowledge: Good  Language: Good  Assets:  Communication Skills Desire for Improvement Resilience  ADL's:  Intact  Cognition: WNL  Prognosis:  Good   Screenings:  AIMS     Office Visit from 09/15/2016 in [place] Total Score  0    GAD-7     Office Visit from 12/01/2018 in [facility] Office Visit from 04/06/2018 in [facility] Office Visit from 10/24/2017 in [facility] Office Visit from 10/09/2015 in [facility]  Total GAD-7 Score  0  0  0  21    PHQ2-9     Office Visit from 03/30/2019 in [facility] Office Visit from 12/29/2018 in [facility] Office Visit from 12/01/2018 in [facility] Office Visit from 05/25/2018 in [facility] Office Visit from 04/06/2018 in [facility]  PHQ-2 Total Score  3  0  0  0  0  PHQ-9 Total Score  14  1  0  0  0      Receiving Psychotherapy: Yes   Treatment Plan/Recommendations: Patient seen for 60 minutes and greater than 50% of visit spent counseling patient regarding depression and anxiety signs and symptoms and providing overview of different types of medications used to treat depression anxiety, along with proposed mechanism of action.  Reviewed potential  benefits, risks, and side effects of medications that he has taken in the past to include Cymbalta, Abilify, and temazepam. Discussed potential metabolic side effects associated with atypical antipsychotics, as well as potential risk for movement side effects. Advised pt to contact office if movement side effects occur.  Agreed to restart previous medications since mood, anxiety, and insomnia improved with these medications without any tolerability issues. Recommend continuing psychotherapy with Rinaldo Cloud, LCSW. Pt to f/u with this provider in 4-5 weeks or sooner if clinically indicated. Patient advised to contact office with any questions, adverse effects, or acute worsening in signs and symptoms.     Thayer Headings, PMHNP

## 2019-04-18 ENCOUNTER — Other Ambulatory Visit: Payer: Self-pay

## 2019-04-18 ENCOUNTER — Ambulatory Visit (INDEPENDENT_AMBULATORY_CARE_PROVIDER_SITE_OTHER): Admitting: Psychiatry

## 2019-04-18 DIAGNOSIS — F411 Generalized anxiety disorder: Secondary | ICD-10-CM

## 2019-04-18 NOTE — Progress Notes (Signed)
[facility] Counselor/Therapist Progress Note  Patient ID: Joseph Wilcox, MRN: [id number],    Date: 04/18/2019  Time Spent: 60 minutes   8:00am to 9:00am  Treatment Type: Individual Therapy  Reported Symptoms:  Anxiety, some depression, some prior SI  Mental Status Exam:  Appearance:   Casual     Behavior:  Appropriate and Sharing  Motor:  Normal  Speech/Language:   Normal Rate  Affect:  anxious, depressed, "feeling like a failure at times", some SI  Mood:  anxious and depressed  Thought process:  goal directed  Thought content:    WNL  Sensory/Perceptual disturbances:    WNL  Orientation:  oriented to person, place, time/date, situation, day of week, month of year and year  Attention:  Good  Concentration:  Good  Memory:  WNL  Fund of knowledge:   Good  Insight:    Good  Judgment:   Good  Impulse Control:  Good   Risk Assessment: Danger to Self:  No Self-injurious Behavior: No Danger to Others: No Duty to Warn:no Physical Aggression / Violence:No  Access to Firearms a concern: No  Gang Involvement:No   Subjective:  Patient in today with anxiety and some depression and "feeling a failure".  Had a bad few days with some SI, which we talked through today and he denies and current SI and states he realizes he would never end his life because that would devastate his family. States he thought about out prior discussions and a handout I gave him, which he felt was helpful re: worrying and thought patterns. At this point he is to end current job 08/29/2019, starting to work on Resume, and will be looking for other job closer to beginning of 2021.    Interventions: Cognitive Behavioral Therapy and Ego-Supportive  Diagnosis:   ICD-10-CM   1. Generalized anxiety disorder  F41.1      Plan: Patient not signing tx plan on computer screen due to [place].    Treatment Goals:  Goals remain the same and progress in being updated below each session. We reviewed goals  today and talked about progress noted below.  Long Term Goal: 1. Develop healthy interpersonal relationships that lead to alleviation and help prevent relapse of depression.  Strategy: Patient will intentionally seek to have more interpersonal relationships that are healthy and support the alleviation of depression.  Progressing- Patient maintains his progress made earlier on his long term goal, continuing to have contact with people who are supportive of him. Wife is supportive and they try to have some intentional time together several times a week.  Patient uses online motivational and spiritual resources that he has found helpful.  Short Term Goal: 2. Identify and replace depressive thinking that leads to feeling more depressed and leads to depressive actions.  Strategy: Patient will replace negative and self-defeating self-talk with more realistic and positive messages.  Progressing: Patient continues to work on self-forgiveness and we processed this today in session.  Less anger at self, increasing self esteem gradually. Focus on self-forgiveness has also helped him in trying to decrease worrying and in interrupting negative self-talk and being able to convert his self-talk to be more positive and believing in himself.  Holding onto that can be a challenge and he is working at. Used notes he made on his phone last session as reminders of self-forgiveness strategies and interrupting the negative self-talk.  On 1-10 scale of feeling better, he rates himself a "6" today but during his  recent "rough period" he rated himself a "2".  Remains free of any SI at this time and will continue to monitor.    Return appt for therapy within 2 wks.   Shanon Ace, LCSW

## 2019-04-25 ENCOUNTER — Other Ambulatory Visit: Payer: Self-pay

## 2019-04-25 ENCOUNTER — Other Ambulatory Visit

## 2019-04-25 DIAGNOSIS — I10 Essential (primary) hypertension: Secondary | ICD-10-CM

## 2019-04-25 DIAGNOSIS — E782 Mixed hyperlipidemia: Secondary | ICD-10-CM

## 2019-04-25 DIAGNOSIS — R7309 Other abnormal glucose: Secondary | ICD-10-CM

## 2019-04-25 DIAGNOSIS — Z0000 Encounter for general adult medical examination without abnormal findings: Secondary | ICD-10-CM

## 2019-04-25 DIAGNOSIS — K219 Gastro-esophageal reflux disease without esophagitis: Secondary | ICD-10-CM

## 2019-04-25 DIAGNOSIS — D581 Hereditary elliptocytosis: Secondary | ICD-10-CM

## 2019-04-26 LAB — CBC WITH DIFFERENTIAL/PLATELET
Absolute Monocytes: 541 cells/uL (ref 200–950)
Basophils Absolute: 31 cells/uL (ref 0–200)
Basophils Relative: 0.3 %
Eosinophils Absolute: 51 cells/uL (ref 15–500)
Eosinophils Relative: 0.5 %
HCT: 38.1 % — ABNORMAL LOW (ref 38.5–50.0)
Hemoglobin: 12 g/dL — ABNORMAL LOW (ref 13.2–17.1)
Lymphs Abs: 2611 cells/uL (ref 850–3900)
MCH: 21.5 pg — ABNORMAL LOW (ref 27.0–33.0)
MCHC: 31.5 g/dL — ABNORMAL LOW (ref 32.0–36.0)
MCV: 68.4 fL — ABNORMAL LOW (ref 80.0–100.0)
MPV: 9.2 fL (ref 7.5–12.5)
Monocytes Relative: 5.3 %
Neutro Abs: 6967 cells/uL (ref 1500–7800)
Neutrophils Relative %: 68.3 %
Platelets: 342 10*3/uL (ref 140–400)
RBC: 5.57 10*6/uL (ref 4.20–5.80)
RDW: 16.3 % — ABNORMAL HIGH (ref 11.0–15.0)
Total Lymphocyte: 25.6 %
WBC: 10.2 10*3/uL (ref 3.8–10.8)

## 2019-04-26 LAB — LIPID PANEL
Cholesterol: 234 mg/dL — ABNORMAL HIGH (ref <200)
HDL: 57 mg/dL (ref >=40)
LDL Cholesterol (Calc): 150 mg/dL (calc) — ABNORMAL HIGH
Non-HDL Cholesterol (Calc): 177 mg/dL (calc) — ABNORMAL HIGH (ref <130)
Total CHOL/HDL Ratio: 4.1 (calc) (ref <5.0)
Triglycerides: 144 mg/dL (ref <150)

## 2019-04-26 LAB — COMPLETE METABOLIC PANEL WITH GFR
AG Ratio: 1.7 (calc) (ref 1.0–2.5)
ALT: 24 U/L (ref 9–46)
AST: 17 U/L (ref 10–40)
Albumin: 4.3 g/dL (ref 3.6–5.1)
Alkaline phosphatase (APISO): 65 U/L (ref 36–130)
BUN: 11 mg/dL (ref 7–25)
CO2: 32 mmol/L (ref 20–32)
Calcium: 9.4 mg/dL (ref 8.6–10.3)
Chloride: 97 mmol/L — ABNORMAL LOW (ref 98–110)
Creat: 1.3 mg/dL (ref 0.60–1.35)
GFR, Est African American: 76 mL/min/{1.73_m2} (ref >=60)
GFR, Est Non African American: 66 mL/min/{1.73_m2} (ref >=60)
Globulin: 2.5 g/dL (calc) (ref 1.9–3.7)
Glucose, Bld: 86 mg/dL (ref 65–99)
Potassium: 3.5 mmol/L (ref 3.5–5.3)
Sodium: 139 mmol/L (ref 135–146)
Total Bilirubin: 0.8 mg/dL (ref 0.2–1.2)
Total Protein: 6.8 g/dL (ref 6.1–8.1)

## 2019-04-26 LAB — HEMOGLOBIN A1C
Hgb A1c MFr Bld: 5.9 % of total Hgb — ABNORMAL HIGH (ref <5.7)
Mean Plasma Glucose: 123 (calc)
eAG (mmol/L): 6.8 (calc)

## 2019-05-02 ENCOUNTER — Encounter: Payer: Self-pay | Admitting: Family Medicine

## 2019-05-02 ENCOUNTER — Ambulatory Visit (INDEPENDENT_AMBULATORY_CARE_PROVIDER_SITE_OTHER): Admitting: Family Medicine

## 2019-05-02 ENCOUNTER — Other Ambulatory Visit: Payer: Self-pay

## 2019-05-02 VITALS — BP 132/72 | HR 85 | Temp 98.4°F | Resp 16 | Ht 73.0 in | Wt 291.0 lb

## 2019-05-02 DIAGNOSIS — G4733 Obstructive sleep apnea (adult) (pediatric): Secondary | ICD-10-CM

## 2019-05-02 DIAGNOSIS — R7309 Other abnormal glucose: Secondary | ICD-10-CM

## 2019-05-02 DIAGNOSIS — D581 Hereditary elliptocytosis: Secondary | ICD-10-CM

## 2019-05-02 DIAGNOSIS — Z0000 Encounter for general adult medical examination without abnormal findings: Secondary | ICD-10-CM

## 2019-05-02 DIAGNOSIS — E669 Obesity, unspecified: Secondary | ICD-10-CM

## 2019-05-02 DIAGNOSIS — E782 Mixed hyperlipidemia: Secondary | ICD-10-CM

## 2019-05-02 DIAGNOSIS — M5431 Sciatica, right side: Secondary | ICD-10-CM

## 2019-05-02 DIAGNOSIS — M75112 Incomplete rotator cuff tear or rupture of left shoulder, not specified as traumatic: Secondary | ICD-10-CM

## 2019-05-02 DIAGNOSIS — I10 Essential (primary) hypertension: Secondary | ICD-10-CM

## 2019-05-02 DIAGNOSIS — D573 Sickle-cell trait: Secondary | ICD-10-CM

## 2019-05-02 DIAGNOSIS — F3341 Major depressive disorder, recurrent, in partial remission: Secondary | ICD-10-CM

## 2019-05-02 DIAGNOSIS — M205X1 Other deformities of toe(s) (acquired), right foot: Secondary | ICD-10-CM

## 2019-05-02 DIAGNOSIS — Z9989 Dependence on other enabling machines and devices: Secondary | ICD-10-CM

## 2019-05-02 MED ORDER — PRAVASTATIN SODIUM 20 MG PO TABS: 20.0000 mg | ORAL_TABLET | Freq: Every day | ORAL | 3 refills | Status: DC

## 2019-05-02 NOTE — Assessment & Plan Note (Signed)
Improved BP, weight loss On CPAP for OSA    Plan:  1. Continue current Amlodipine 10mg  daily, Azilartan-Chlorthalidone 40-25mg  daily - IF future if too dizzy at times can REDUCE Amlodipine down to 5mg  2. Encourage improved lifestyle - low sodium diet, regular exercise 3. Continue improving monitor BP outside office, bring readings to next visit, if persistently >140/90 or new symptoms notify office sooner

## 2019-05-02 NOTE — Assessment & Plan Note (Signed)
Significantly improved cholesterol back on pravastatin and lifestyle Last lipid panel 04/2019 - still elevated LDL >150 but MUCH improved from >200  Plan: 1. Continue current meds - Pravastatin 20mg  daily - re order, future can double dose to 40mg  if need 2. Encourage improved lifestyle - low carb/cholesterol, reduce portion size, continue improving regular exercise

## 2019-05-02 NOTE — Assessment & Plan Note (Signed)
Weight loss 2-3 lbs Improve encourage lifestyle, diet, limited exercise now

## 2019-05-02 NOTE — Patient Instructions (Addendum)
Thank you for coming to the office today.  Keep up the good work overall.  Results printed  Sugar check next time  Recent Labs    05/15/18 0812 12/29/18 1048 04/25/19 0819  HGBA1C 5.9* 5.9* 5.9*    Keep improving diet.  Refilled cholesterol med, great job with improvement on cholesterol.  Monitor lightheaded when standing, if persisting, spinning room, or near pass out, let us know   Please schedule a Follow-up Appointment to: Return in about 6 months (around 10/31/2019) for 6 month PreDM A1c, HTN.  If you have any other questions or concerns, please feel free to call the office or send a message through [place]. You may also schedule an earlier appointment if necessary.  Additionally, you may be receiving a survey about your experience at our office within a few days to 1 week by e-mail or mail. We value your feedback.  Nobie Putnam, DO [place]

## 2019-05-02 NOTE — Assessment & Plan Note (Signed)
Significantly improved mood, depression now in partial remission Chronic anxiety Improved stressors Followed by [facility] in [place] Back on med regimen

## 2019-05-02 NOTE — Assessment & Plan Note (Signed)
Improved control now of chronic OSA on CPAP - Good adherence to CPAP nightly now wear most of night, tolerating - Continue current CPAP therapy, patient seems to be benefiting from therapy 

## 2019-05-02 NOTE — Assessment & Plan Note (Signed)
Stable, unchanged Mild elevated A1c 5.9, in range of PreDM  Plan:  1. Not on any therapy currently  2. Encourage improved lifestyle - low carb, low sugar diet, reduce portion size, continue improving regular exercise 

## 2019-05-02 NOTE — Progress Notes (Addendum)
Subjective:    Patient ID: Joseph Wilcox, male    DOB: 07-07-1970, 48 y.o.   MRN: [id number]  Joseph Wilcox is a 48 y.o. male presenting on 05/02/2019 for Annual Exam   HPI   Here for Annual Physical and Lab Review.  Pre-Diabetes / Obesity BMI >38 Last A1c 5.9, stable over past 1 year. Fam history of DM Lifestyle - Diet: Balanced diet, limit starches, reduce portions - Exercise: Currently not actively exercising due to shoulder and leg issues - Weight down 2-3 lbs, improved diet  CHRONIC HTN: BP home readings improved on Amlodipine. Admits occasional very brief seconds to minute lightheaded dizzy when stand up suddenly then resolves. Current Meds -Edarbyclor 40-25mg  daily, Amlodipine 10mg  Reports good compliance, took meds today. Tolerating well, w/o complaints. Denies CP, dyspnea, HA, edema  OSA, on CPAP - Patient reports prior history of dx OSA and on CPAP - Today reports that sleep apnea is well controlled. He uses the CPAP machine every night. Tolerates the machine well, and thinks that sleeps better with it and feels good. No new concerns or symptoms.  Additional updates  Follow-up Low Back Pain / Sciatica Last 03/2019, improved on steroid, and gabapentin up to 200mg  nightly, improved but not resolved.  Hallux Limitus, Right Significantly limiting his ability to complete fitness training with run/walk and lower body exercises Followed by Dr Amalia [facility], currently on treatment regimen with injections and medication, with future goal of possible surgery as indicated. Additional footwear treatment as well.  Partial Thickness Tear Rotator Cuff (Left) Persistent issue, now worsening over past 1 year with chronic L shoulder pain limiting his function with upper body strength training and unable to do above head activity or lift >20 lbs or carry body armor or heavy materials as needed. He is working with Orthopedics currently for treatment plan and may benefit from  future surgical repair.  Major Depression recurrent partial remission / Anxiety Now back with Behavioral Health in [place] in 03/2019 and 04/2019, back on meds. With Abilify, Duloxetine, Temazepam - doing better.  Health Maintenance: UTD Flu Vaccine  Colon CA Screening: Last Colonoscopy 03/09/2019 (done by Dr Vicente Males [facility]), results with 1 hyperplastic polyp, good for 10 years - next due in 2030. Currently asymptomatic. No known family history of colon CA.  Depression screen [facility] 2/9 05/02/2019 03/30/2019 12/29/2018  Decreased Interest 1 1 0  Down, Depressed, Hopeless 0 2 0  PHQ - 2 Score 1 3 0  Altered sleeping 0 2 0  Tired, decreased energy 1 2 1   Change in appetite 0 2 0  Feeling bad or failure about yourself  0 2 0  Trouble concentrating 0 2 0  Moving slowly or fidgety/restless 0 0 0  Suicidal thoughts 0 1 0  PHQ-9 Score 2 14 1   Difficult doing work/chores Not difficult at all Somewhat difficult Not difficult at all  Some recent data might be hidden   GAD 7 : Generalized Anxiety Score 12/01/2018 04/06/2018 10/24/2017 10/09/2015  Nervous, Anxious, on Edge 0 0 0 3  Control/stop worrying 0 0 0 3  Worry too much - different things 0 0 0 3  Trouble relaxing 0 0 0 3  Restless 0 0 0 3  Easily annoyed or irritable 0 0 0 3  Afraid - awful might happen 0 0 0 3  Total GAD 7 Score 0 0 0 21  Anxiety Difficulty Not difficult at all Not difficult at all Not difficult at all Extremely difficult  Past Medical History:  Diagnosis Date   Allergy    Anxiety    Depression    GERD (gastroesophageal reflux disease)    High cholesterol    Hx of migraines    Past Surgical History:  Procedure Laterality Date   COLONOSCOPY WITH PROPOFOL N/A 03/09/2019   Procedure: COLONOSCOPY WITH PROPOFOL;  Surgeon: [name], MD;  Location: [facility] ENDOSCOPY;  Service: Gastroenterology;  Laterality: N/A;   ESOPHAGOGASTRODUODENOSCOPY (EGD) WITH PROPOFOL N/A 03/09/2019   Procedure: ESOPHAGOGASTRODUODENOSCOPY  (EGD) WITH PROPOFOL;  Surgeon: [name], MD;  Location: [facility] ENDOSCOPY;  Service: Gastroenterology;  Laterality: N/A;   FOOT SURGERY  2002   Bone spur   TONSILLECTOMY     Social History   Socioeconomic History   Marital status: Unknown    Spouse name: Not on file   Number of children: Not on file   Years of education: Not on file   Highest education level: Not on file  Occupational History   Not on file  Social Needs   Financial resource strain: Not on file   Food insecurity    Worry: Not on file    Inability: Not on file   Transportation needs    Medical: Not on file    Non-medical: Not on file  Tobacco Use   Smoking status: Former Smoker    Quit date: 07/05/2010    Years since quitting: 8.9   Smokeless tobacco: Former Systems developer  Substance and Sexual Activity   Alcohol use: Yes    Alcohol/week: 0.0 standard drinks    Comment: Occasional    Drug use: No   Sexual activity: Yes    Partners: Female    Birth control/protection: None  Lifestyle   Physical activity    Days per week: Not on file    Minutes per session: Not on file   Stress: Not on file  Relationships   Social connections    Talks on phone: Not on file    Gets together: Not on file    Attends religious service: Not on file    Active member of club or organization: Not on file    Attends meetings of clubs or organizations: Not on file    Relationship status: Not on file   Intimate partner violence    Fear of current or ex partner: Not on file    Emotionally abused: Not on file    Physically abused: Not on file    Forced sexual activity: Not on file  Other Topics Concern   Not on file  Social History Narrative   Not on file   Family History  Problem Relation Age of Onset   Diabetes Mother    Prostate cancer Neg Hx    Colon cancer Neg Hx    Current Outpatient Medications on File Prior to Visit  Medication Sig   amLODipine (NORVASC) 10 MG tablet Take 1 tablet (10 mg total)  by mouth daily.   EDARBYCLOR 40-25 MG TABS TAKE 1 TABLET BY MOUTH DAILY   gabapentin (NEURONTIN) 100 MG capsule Start 1 capsule daily, increase by 1 cap every 2-3 days as tolerated up to 3 times a day, or may take 3 at once in evening.   meloxicam (MOBIC) 15 MG tablet Take 1 tablet (15 mg total) by mouth daily as needed for pain.   omeprazole (PRILOSEC) 20 MG capsule Take 1 capsule (20 mg total) by mouth 2 (two) times daily before a meal.   temazepam (RESTORIL) 15 MG capsule Take  1 capsule (15 mg total) by mouth at bedtime as needed for sleep.   No current facility-administered medications on file prior to visit.     Review of Systems  Constitutional: Negative for activity change, appetite change, chills, diaphoresis, fatigue and fever.  HENT: Negative for congestion and hearing loss.   Eyes: Negative for visual disturbance.  Respiratory: Negative for apnea, cough, chest tightness, shortness of breath and wheezing.   Cardiovascular: Negative for chest pain, palpitations and leg swelling.  Gastrointestinal: Negative for abdominal pain, anal bleeding, blood in stool, constipation, diarrhea, nausea and vomiting.  Endocrine: Negative for cold intolerance.  Genitourinary: Negative for decreased urine volume, dysuria, frequency, hematuria and urgency.  Musculoskeletal: Positive for arthralgias and back pain. Negative for neck pain.  Skin: Negative for rash.  Allergic/Immunologic: Negative for environmental allergies.  Neurological: Negative for dizziness, weakness, light-headedness, numbness and headaches.  Hematological: Negative for adenopathy.  Psychiatric/Behavioral: Negative for behavioral problems, dysphoric mood and sleep disturbance. The patient is not nervous/anxious.    Per HPI unless specifically indicated above     Objective:    BP 132/72    Pulse 85    Temp 98.4 F (36.9 C) (Oral)    Resp 16    Ht 6\' 1"  (1.854 m)    Wt 291 lb (132 kg)    BMI 38.39 kg/m   Wt Readings from  Last 3 Encounters:  05/02/19 291 lb (132 kg)  03/30/19 293 lb (132.9 kg)  03/28/19 295 lb 9.6 oz (134.1 kg)    Physical Exam Vitals signs and nursing note reviewed.  Constitutional:      General: He is not in acute distress.    Appearance: He is well-developed. He is not diaphoretic.     Comments: Well-appearing, comfortable, cooperative  HENT:     Head: Normocephalic and atraumatic.  Eyes:     General:        Right eye: No discharge.        Left eye: No discharge.     Conjunctiva/sclera: Conjunctivae normal.     Pupils: Pupils are equal, round, and reactive to light.  Neck:     Musculoskeletal: Normal range of motion and neck supple.     Thyroid: No thyromegaly.     Comments: No carotid bruits Cardiovascular:     Rate and Rhythm: Normal rate and regular rhythm.     Heart sounds: Normal heart sounds. No murmur.  Pulmonary:     Effort: Pulmonary effort is normal. No respiratory distress.     Breath sounds: Normal breath sounds. No wheezing or rales.  Abdominal:     General: Bowel sounds are normal. There is no distension.     Palpations: Abdomen is soft. There is no mass.     Tenderness: There is no abdominal tenderness.  Musculoskeletal: Normal range of motion.        General: No tenderness.     Comments: Upper / Lower Extremities: - Normal muscle tone, strength bilateral upper extremities 5/5, lower extremities 5/5  Lymphadenopathy:     Cervical: No cervical adenopathy.  Skin:    General: Skin is warm and dry.     Findings: No erythema or rash.  Neurological:     Mental Status: He is alert and oriented to person, place, and time.     Comments: Distal sensation intact to light touch all extremities  Psychiatric:        Behavior: Behavior normal.     Comments: Well groomed, good eye contact,  normal speech and thoughts    Results for orders placed or performed in visit on 04/25/19  Lipid panel  Result Value Ref Range   Cholesterol 234 (H) <200 mg/dL   HDL 57 > OR =  40 mg/dL   Triglycerides 144 <150 mg/dL   LDL Cholesterol (Calc) 150 (H) mg/dL (calc)   Total CHOL/HDL Ratio 4.1 <5.0 (calc)   Non-HDL Cholesterol (Calc) 177 (H) <130 mg/dL (calc)  COMPLETE METABOLIC PANEL WITH GFR  Result Value Ref Range   Glucose, Bld 86 65 - 99 mg/dL   BUN 11 7 - 25 mg/dL   Creat 1.30 0.60 - 1.35 mg/dL   GFR, Est Non African American 66 > OR = 60 mL/min/1.37m2   GFR, Est African American 76 > OR = 60 mL/min/1.8m2   BUN/Creatinine Ratio NOT APPLICABLE 6 - 22 (calc)   Sodium 139 135 - 146 mmol/L   Potassium 3.5 3.5 - 5.3 mmol/L   Chloride 97 (L) 98 - 110 mmol/L   CO2 32 20 - 32 mmol/L   Calcium 9.4 8.6 - 10.3 mg/dL   Total Protein 6.8 6.1 - 8.1 g/dL   Albumin 4.3 3.6 - 5.1 g/dL   Globulin 2.5 1.9 - 3.7 g/dL (calc)   AG Ratio 1.7 1.0 - 2.5 (calc)   Total Bilirubin 0.8 0.2 - 1.2 mg/dL   Alkaline phosphatase (APISO) 65 36 - 130 U/L   AST 17 10 - 40 U/L   ALT 24 9 - 46 U/L  CBC with Differential/Platelet  Result Value Ref Range   WBC 10.2 3.8 - 10.8 Thousand/uL   RBC 5.57 4.20 - 5.80 Million/uL   Hemoglobin 12.0 (L) 13.2 - 17.1 g/dL   HCT 38.1 (L) 38.5 - 50.0 %   MCV 68.4 (L) 80.0 - 100.0 fL   MCH 21.5 (L) 27.0 - 33.0 pg   MCHC 31.5 (L) 32.0 - 36.0 g/dL   RDW 16.3 (H) 11.0 - 15.0 %   Platelets 342 140 - 400 Thousand/uL   MPV 9.2 7.5 - 12.5 fL   Neutro Abs 6,967 1,500 - 7,800 cells/uL   Lymphs Abs 2,611 850 - 3,900 cells/uL   Absolute Monocytes 541 200 - 950 cells/uL   Eosinophils Absolute 51 15 - 500 cells/uL   Basophils Absolute 31 0 - 200 cells/uL   Neutrophils Relative % 68.3 %   Total Lymphocyte 25.6 %   Monocytes Relative 5.3 %   Eosinophils Relative 0.5 %   Basophils Relative 0.3 %   Smear Review    Hemoglobin A1c  Result Value Ref Range   Hgb A1c MFr Bld 5.9 (H) <5.7 % of total Hgb   Mean Plasma Glucose 123 (calc)   eAG (mmol/L) 6.8 (calc)      Assessment & Plan:   Problem List Items Addressed This Visit    Sickle cell trait (HCC)    OSA on CPAP    Improved control now of chronic OSA on CPAP - Good adherence to CPAP nightly now wear most of night, tolerating - Continue current CPAP therapy, patient seems to be benefiting from therapy      Obesity (BMI 35.0-39.9 without comorbidity)    Weight loss 2-3 lbs Improve encourage lifestyle, diet, limited exercise now      Nontraumatic incomplete tear of left rotator cuff    Persistent problem, worsening L shoulder pain with partial thickness tear Limiting his upper body strength training and ability to function with carrying restriction < 20 lbs and unable to  wear/carry protective gear.  Considered to be permanent condition lasting >1 year - may benefit from surgery  Continues treatment with Dr Mack Guise and Orthopedics for further management, may benefit from future rotator cuff surgical repair if indicated.      Mixed hyperlipidemia    Significantly improved cholesterol back on pravastatin and lifestyle Last lipid panel 04/2019 - still elevated LDL >150 but MUCH improved from >200  Plan: 1. Continue current meds - Pravastatin 20mg  daily - re order, future can double dose to 40mg  if need 2. Encourage improved lifestyle - low carb/cholesterol, reduce portion size, continue improving regular exercise      Relevant Medications   pravastatin (PRAVACHOL) 20 MG tablet   Major depressive disorder, recurrent, in partial remission (HCC)    Significantly improved mood, depression now in partial remission Chronic anxiety Improved stressors Followed by [facility] in [place] on med regimen      Hallux limitus, right    Persistent problem, worsening R foot pain and limitation Limiting his lower body and fitness training unable to maintain fitness with walk/run training  Considered to be permanent condition lasting >1 year - may benefit from surgery  Continues treatment with Dr Amalia Hailey Podiatry for further management      Essential hypertension      Improved BP, weight loss On CPAP for OSA    Plan:  1. Continue current Amlodipine 10mg  daily, Azilartan-Chlorthalidone 40-25mg  daily - IF future if too dizzy at times can REDUCE Amlodipine down to 5mg  2. Encourage improved lifestyle - low sodium diet, regular exercise 3. 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 Not on any therapy currently  2. Encourage improved lifestyle - low carb, low sugar diet, reduce portion size, continue improving regular exercise       Other Visit Diagnoses    Annual physical exam    -  Primary   Sciatica of right side       Chronic recurrent back pain with sciatica flare up. Contributing to his limited function and ability to train and maintain fitness level.      Updated Health Maintenance information Reviewed recent lab results with patient Encouraged improvement to lifestyle with diet and exercise Goal of weight loss   Meds ordered this encounter  Medications   pravastatin (PRAVACHOL) 20 MG tablet    Sig: Take 1 tablet (20 mg total) by mouth daily.    Dispense:  90 tablet    Refill:  3     Follow up plan: Return in about 6 months (around 10/31/2019) for 6 month PreDM A1c, HTN.  Nobie Putnam, DO [facility] 05/02/2019, 8:29 AM

## 2019-05-02 NOTE — Assessment & Plan Note (Signed)
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

## 2019-05-03 ENCOUNTER — Ambulatory Visit (INDEPENDENT_AMBULATORY_CARE_PROVIDER_SITE_OTHER): Admitting: Psychiatry

## 2019-05-03 DIAGNOSIS — F411 Generalized anxiety disorder: Secondary | ICD-10-CM | POA: Diagnosis not present

## 2019-05-03 NOTE — Progress Notes (Signed)
[facility] Counselor/Therapist Progress Note  Patient ID: Joseph Wilcox, MRN: [id number],    Date: 05/03/2019   Time Spent: 60 minutes  9:00am to 10:00am  Treatment Type: Individual Therapy  Reported Symptoms:  Some worrying (decreased), sleep has improved some, anxiety is lessening, still having some depression which is "a little decreased" Mental Status Exam:  Appearance:   Casual     Behavior:  Appropriate and Sharing  Motor:  Normal  Speech/Language:   Normal Rate  Affect:  Depressed  Mood:  depressed and some anxiety but it has lessened  Thought process:  goal directed  Thought content:    WNL  Sensory/Perceptual disturbances:    WNL  Orientation:  oriented to person, place, time/date, situation, day of week, month of year and year  Attention:  Good  Concentration:  Good  Memory:  WNL  Fund of knowledge:   Good  Insight:    Good  Judgment:   Good  Impulse Control:  Good   Risk Assessment: Danger to Self:  No Self-injurious Behavior: No Danger to Others: No Duty to Warn:no Physical Aggression / Violence:No  Access to Firearms a concern: No  Gang Involvement:No   Subjective: Patient in today reports some improvement in his anxiety, hopefulness, life is not "as dreaded" as it was and I think starting to see the light at the end of the tunnel.   Interventions: Cognitive Behavioral Therapy and Ego-Supportive  Diagnosis:   ICD-10-CM   1. Generalized anxiety disorder  F41.1      Plan: Patient not signing tx plan on computer screen due to [place].  Treatment Goals:  Goals remain the same and progress in being updated beloweach session.We reviewed goals today and talked about progress noted below.  Long Term Goal:  Develop healthy interpersonal relationships that lead to alleviation and help prevent relapse of depression.  Strategy: Patient will intentionally seek to have more interpersonal relationships that are healthy and support the  alleviation of depression.  Progressing- Patient maintains his progress made earlier on his long term goal, continuing to have contact with people who are supportive of him. Wife is supportive and they try to have some intentional time together several times a week.  Patient uses online motivational and spiritual resources that he has found helpful.  Short Term Goal:  Identify and replace depressive thinking that leads to feeling more depressed and leads to depressive actions.  Strategy: Patient will replace negative and self-defeating self-talk with more realistic and positive messages.  Progressing: Patient is working consistently on his goals, especially his short term goal.  Began more last week of working on forgiveness of self and others, which he feels is closely linked to his depression.  Has found in working on this that there are "old grudges that he has held onto and is not working on forgiving. "I take a look at myself and others and realizing as we spoke last session that sometimes misperceive intentions of others, and sometimes there are things about situations we don't fully understand and should not rush to judgement."  He and wife have gotten closer and that's feeling good to patient. Trying to decrease his feeling "critical of other people, and not judging". Reports anger at himself has decreased and continues to work on interrupting it and "tries to diffuse the worry". Talked with him about being more proactive in making changes with trying to stop worrying, etc.  Discussed in session some strategies for interrupting worrying and other  negative thoughts and patient is motivated and says "I'm adding it to my list to work on."  Patient added "I know I stay in bed too much when I'm not working"--we explored some simple activities that he could involve himself in household chores, get outside for fresh air and some walking, calling family & friends, reading things of interest."   Review of session and tasks to focus on in working on his goals and improved self-talk.  Patient has a great attitude.  Goal review and progress noted.   Next appt is within 2 weeks.   Shanon Ace, LCSW

## 2019-05-04 ENCOUNTER — Ambulatory Visit (INDEPENDENT_AMBULATORY_CARE_PROVIDER_SITE_OTHER): Admitting: Psychiatry

## 2019-05-04 ENCOUNTER — Encounter: Payer: Self-pay | Admitting: Psychiatry

## 2019-05-04 ENCOUNTER — Other Ambulatory Visit: Payer: Self-pay

## 2019-05-04 VITALS — BP 149/92 | HR 86

## 2019-05-04 DIAGNOSIS — F411 Generalized anxiety disorder: Secondary | ICD-10-CM | POA: Diagnosis not present

## 2019-05-04 DIAGNOSIS — G4700 Insomnia, unspecified: Secondary | ICD-10-CM

## 2019-05-04 DIAGNOSIS — F331 Major depressive disorder, recurrent, moderate: Secondary | ICD-10-CM | POA: Diagnosis not present

## 2019-05-04 MED ORDER — ARIPIPRAZOLE 2 MG PO TABS: 2.0000 mg | ORAL_TABLET | Freq: Every day | ORAL | 0 refills | Status: DC

## 2019-05-04 MED ORDER — DULOXETINE HCL 60 MG PO CPEP: 60.0000 mg | ORAL_CAPSULE | Freq: Every day | ORAL | 1 refills | Status: DC

## 2019-05-04 NOTE — Progress Notes (Signed)
   05/04/19 1553  Facial and Oral Movements  Muscles of Facial Expression 0  Lips and Perioral Area 0  Jaw 0  Tongue 0  Extremity Movements  Upper (arms, wrists, hands, fingers) 0  Lower (legs, knees, ankles, toes) 0  Trunk Movements  Neck, shoulders, hips 0  Overall Severity  Severity of abnormal movements (highest score from questions above) 0  Incapacitation due to abnormal movements 0  Patient's awareness of abnormal movements (rate only patient's report) 0  AIMS Total Score  AIMS Total Score 0

## 2019-05-04 NOTE — Progress Notes (Signed)
MARQUICE CRABBE [id number] 1971-03-13 48 y.o.  Subjective:   Patient ID:  Joseph Wilcox is a 48 y.o. (DOB Mar 26, 1971) male.  Chief Complaint:  Chief Complaint  Patient presents with  . Follow-up    Depression, Anxiety    HPI ANOTHONY WROBLEWSKI presents to the office today for follow-up of mood and anxiety. He reports that his anxiety has improved. He reports that worry is less and it has become easier to re-direct anxious thoughts. Less catastrophic thinking.  He reports that he continues to have some depression and that overall depression has significantly improved. Rates depression a 3 out of 10 with 10= the highest depression. Reports decreased irritability. Sleep has improved and is now sleeping through the night.Sleeping about 8 hours a night. He reports that he feels that he could continue to sleeping for long periods due to low energy. Has stayed in bed or returned to bed on days off. Motivation remains low. Appetite has been ok. He reports that he will periodically get hungry but does not have a craving for certain foods. Continues to have diminished interest in things. He reports that he is not interested in shows or looking at books. Concentration is adequate. Reports excessive guilt has resolved. Denies SI.   Had periods where he was more antsy last week.   Past Psychiatric Medication Trials: Duloxetine- Was taking 60 mg. Helpful for both anxiety and depression. Denies any long-term side effects. Initially had a "tickling, fluttering" sensation in chest.  Abilify- Was taking 2 mg po qd.  Gabapentin- Being prescribed for sciatic nerve pain. Has not noticed any effect on anxiety Ritalin- Took in childhood for ADD.  Temazepam- Effective Trazodone- Nightmares  Review of Systems:  Review of Systems  Musculoskeletal: Negative for gait problem.       Pain in right foot. Left shoulder pain. Sciatica  Neurological: Negative for tremors.  Psychiatric/Behavioral:       Please  refer to HPI    Medications: I have reviewed the patient's current medications.  Current Outpatient Medications  Medication Sig Dispense Refill  . amLODipine (NORVASC) 10 MG tablet Take 1 tablet (10 mg total) by mouth daily. 90 tablet 1  . ARIPiprazole (ABILIFY) 2 MG tablet Take 1 tablet (2 mg total) by mouth daily. 90 tablet 0  . DULoxetine (CYMBALTA) 60 MG capsule Take 1 capsule (60 mg total) by mouth daily. 30 capsule 1  . EDARBYCLOR 40-25 MG TABS TAKE 1 TABLET BY MOUTH DAILY 90 tablet 1  . gabapentin (NEURONTIN) 100 MG capsule Start 1 capsule daily, increase by 1 cap every 2-3 days as tolerated up to 3 times a day, or may take 3 at once in evening. 90 capsule 1  . meloxicam (MOBIC) 15 MG tablet Take 1 tablet (15 mg total) by mouth daily as needed for pain. 30 tablet 0  . omeprazole (PRILOSEC) 20 MG capsule Take 1 capsule (20 mg total) by mouth 2 (two) times daily before a meal. 180 capsule 1  . pravastatin (PRAVACHOL) 20 MG tablet Take 1 tablet (20 mg total) by mouth daily. 90 tablet 3  . temazepam (RESTORIL) 15 MG capsule Take 1 capsule (15 mg total) by mouth at bedtime as needed for sleep. 30 capsule 1   No current facility-administered medications for this visit.     Medication Side Effects: None  Allergies: No Known Allergies  Past Medical History:  Diagnosis Date  . Allergy   . Anxiety   . Depression   . GERD (  gastroesophageal reflux disease)   . High cholesterol   . Hx of migraines     Family History  Problem Relation Age of Onset  . Diabetes Mother   . Prostate cancer Neg Hx   . Colon cancer Neg Hx     Social History   Socioeconomic History  . Marital status: Unknown    Spouse name: Not on file  . Number of children: Not on file  . Years of education: Not on file  . Highest education level: Not on file  Occupational History  . Not on file  Social Needs  . Financial resource strain: Not on file  . Food insecurity    Worry: Not on file    Inability: Not  on file  . Transportation needs    Medical: Not on file    Non-medical: Not on file  Tobacco Use  . Smoking status: Former Smoker    Quit date: 07/05/2010    Years since quitting: 8.8  . Smokeless tobacco: Former Network engineer and Sexual Activity  . Alcohol use: Yes    Alcohol/week: 0.0 standard drinks    Comment: Occasional   . Drug use: No  . Sexual activity: Yes    Partners: Female    Birth control/protection: None  Lifestyle  . Physical activity    Days per week: Not on file    Minutes per session: Not on file  . Stress: Not on file  Relationships  . Social Herbalist on phone: Not on file    Gets together: Not on file    Attends religious service: Not on file    Active member of club or organization: Not on file    Attends meetings of clubs or organizations: Not on file    Relationship status: Not on file  . Intimate partner violence    Fear of current or ex partner: Not on file    Emotionally abused: Not on file    Physically abused: Not on file    Forced sexual activity: Not on file  Other Topics Concern  . Not on file  Social History Narrative  . Not on file    Past Medical History, Surgical history, Social history, and Family history were reviewed and updated as appropriate.   Please see review of systems for further details on the patient's review from today.   Objective:   Physical Exam:  BP (!) 149/92   Pulse 86   Physical Exam Constitutional:      General: He is not in acute distress.    Appearance: He is well-developed.  Musculoskeletal:        General: No deformity.  Neurological:     Mental Status: He is alert and oriented to person, place, and time.     Coordination: Coordination normal.  Psychiatric:        Attention and Perception: Attention and perception normal. He does not perceive auditory or visual hallucinations.        Mood and Affect: Mood is depressed. Mood is not anxious. Affect is not labile, blunt, angry or  inappropriate.        Speech: Speech normal.        Behavior: Behavior normal.        Thought Content: Thought content normal. Thought content does not include homicidal or suicidal ideation. Thought content does not include homicidal or suicidal plan.        Cognition and Memory: Cognition and memory normal.  Judgment: Judgment normal.     Comments: Mood presents as less depressed compared to last exam. Insight intact. No delusions.      Lab Review:     Component Value Date/Time   NA 139 04/25/2019 0819   K 3.5 04/25/2019 0819   CL 97 (L) 04/25/2019 0819   CO2 32 04/25/2019 0819   GLUCOSE 86 04/25/2019 0819   BUN 11 04/25/2019 0819   CREATININE 1.30 04/25/2019 0819   CALCIUM 9.4 04/25/2019 0819   PROT 6.8 04/25/2019 0819   ALBUMIN 4.3 02/21/2017 1600   AST 17 04/25/2019 0819   ALT 24 04/25/2019 0819   ALKPHOS 59 02/21/2017 1600   BILITOT 0.8 04/25/2019 0819   GFRNONAA 66 04/25/2019 0819   GFRAA 76 04/25/2019 0819       Component Value Date/Time   WBC 10.2 04/25/2019 0819   RBC 5.57 04/25/2019 0819   HGB 12.0 (L) 04/25/2019 0819   HGB 12.6 (L) 02/09/2019 0808   HCT 38.1 (L) 04/25/2019 0819   HCT 39.0 02/09/2019 0808   PLT 342 04/25/2019 0819   PLT 386 02/09/2019 0808   MCV 68.4 (L) 04/25/2019 0819   MCV 69 (L) 02/09/2019 0808   MCH 21.5 (L) 04/25/2019 0819   MCHC 31.5 (L) 04/25/2019 0819   RDW 16.3 (H) 04/25/2019 0819   RDW 17.5 (H) 02/09/2019 0808   LYMPHSABS 2,611 04/25/2019 0819   LYMPHSABS 3.1 02/09/2019 0808   EOSABS 51 04/25/2019 0819   EOSABS 0.1 02/09/2019 0808   BASOSABS 31 04/25/2019 0819   BASOSABS 0.0 02/09/2019 0808    No results found for: POCLITH, LITHIUM   No results found for: PHENYTOIN, PHENOBARB, VALPROATE, CBMZ   .res Assessment: Plan:   Discussed potential benefits, risks, and side effects of increasing Cymbalta to 60 mg po qd since pt has had a partial response to 30 mg dose and continues to have some residual depressive  s/s. Continue Abilify 2 mg po qd for depression.  Continue Temazepam prn insomnia. Recommend psychotherapy with Rinaldo Cloud, LCSW. Pt to f/u with this provider in 4 weeks or sooner if clinically indicated.  Patient advised to contact office with any questions, adverse effects, or acute worsening in signs and symptoms.  Keinan was seen today for follow-up.  Diagnoses and all orders for this visit:  MDD (major depressive disorder), recurrent episode, moderate (HCC) -     ARIPiprazole (ABILIFY) 2 MG tablet; Take 1 tablet (2 mg total) by mouth daily. -     DULoxetine (CYMBALTA) 60 MG capsule; Take 1 capsule (60 mg total) by mouth daily.  Generalized anxiety disorder -     DULoxetine (CYMBALTA) 60 MG capsule; Take 1 capsule (60 mg total) by mouth daily.  Insomnia, unspecified type     Please see After Visit Summary for patient specific instructions.  Future Appointments  Date Time Provider [place]  05/17/2019  8:00 AM Shanon Ace, LCSW [facility] None  06/07/2019  8:00 AM Shanon Ace, LCSW [facility] None  06/07/2019 11:00 AM Thayer Headings, PMHNP [facility] None  06/21/2019  8:00 AM Shanon Ace, LCSW [facility] None  07/12/2019  8:00 AM Shanon Ace, LCSW [facility] None    No orders of the defined types were placed in this encounter.   -------------------------------

## 2019-05-06 ENCOUNTER — Other Ambulatory Visit: Payer: Self-pay | Admitting: Psychiatry

## 2019-05-06 DIAGNOSIS — F331 Major depressive disorder, recurrent, moderate: Secondary | ICD-10-CM

## 2019-05-17 ENCOUNTER — Ambulatory Visit: Admitting: Psychiatry

## 2019-05-22 ENCOUNTER — Encounter: Payer: Self-pay | Admitting: Podiatry

## 2019-05-22 ENCOUNTER — Other Ambulatory Visit: Payer: Self-pay

## 2019-05-22 ENCOUNTER — Ambulatory Visit (INDEPENDENT_AMBULATORY_CARE_PROVIDER_SITE_OTHER): Admitting: Podiatry

## 2019-05-22 DIAGNOSIS — M205X1 Other deformities of toe(s) (acquired), right foot: Secondary | ICD-10-CM | POA: Diagnosis not present

## 2019-05-24 NOTE — Progress Notes (Signed)
   HPI: 48 year old male presenting today for a follow up evaluation of hallux limitus of the right foot. He reports intermittent aching pain that returned about two months. Walking up and down stairs increases the pain. He states the injection he received in the past helped alleviate the pain significantly. He has been taking Meloxicam with no relief. Patient is here for further evaluation and treatment.   Past Medical History:  Diagnosis Date  . Allergy   . Anxiety   . Depression   . GERD (gastroesophageal reflux disease)   . High cholesterol   . Hx of migraines      Physical Exam: General: The patient is alert and oriented x3 in no acute distress.  Dermatology: Skin is warm, dry and supple bilateral lower extremities. Negative for open lesions or macerations.  Vascular: Palpable pedal pulses bilaterally. No edema or erythema noted. Capillary refill within normal limits.  Neurological: Epicritic and protective threshold grossly intact bilaterally.   Musculoskeletal Exam: Pain on palpation with limited range of motion noted to the first MPJ right foot.  Assessment: 1. Hallux limitus right - recurrent  2. H/o cheilectomy right    Plan of Care:  1. Patient evaluated.  2. Injection of 0.5 mLs Celestone Soluspan injected into the 1st MPJ of the right foot.  3. Recommended good shoe gear.  4. Return to clinic as needed.   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      Edrick Kins, DPM [facility]  Dr. Edrick Kins, DPM    [address], [place] [address]                Office [phone]  Fax [phone]

## 2019-05-30 DIAGNOSIS — M205X1 Other deformities of toe(s) (acquired), right foot: Secondary | ICD-10-CM | POA: Insufficient documentation

## 2019-05-30 DIAGNOSIS — M75112 Incomplete rotator cuff tear or rupture of left shoulder, not specified as traumatic: Secondary | ICD-10-CM | POA: Insufficient documentation

## 2019-05-30 NOTE — Assessment & Plan Note (Signed)
Persistent problem, worsening L shoulder pain with partial thickness tear Limiting his upper body strength training and ability to function with carrying restriction < 20 lbs and unable to wear/carry protective gear.  Considered to be permanent condition lasting >1 year - may benefit from surgery  Continues treatment with Dr Mack Guise and Orthopedics for further management, may benefit from future rotator cuff surgical repair if indicated.

## 2019-05-30 NOTE — Assessment & Plan Note (Signed)
Persistent problem, worsening R foot pain and limitation Limiting his lower body and fitness training unable to maintain fitness with walk/run training  Considered to be permanent condition lasting >1 year - may benefit from surgery  Continues treatment with Dr Amalia Hailey Podiat[facility] for further management

## 2019-06-07 ENCOUNTER — Other Ambulatory Visit: Payer: Self-pay

## 2019-06-07 ENCOUNTER — Ambulatory Visit: Admitting: Psychiatry

## 2019-06-07 ENCOUNTER — Ambulatory Visit (INDEPENDENT_AMBULATORY_CARE_PROVIDER_SITE_OTHER): Admitting: Psychiatry

## 2019-06-07 DIAGNOSIS — F331 Major depressive disorder, recurrent, moderate: Secondary | ICD-10-CM

## 2019-06-07 NOTE — Progress Notes (Addendum)
[facility] Counselor/Therapist Progress Note  Patient ID: QUANTE TOMASO, MRN: [id number],    Date: 06/07/2019  Time Spent: 65 minutes 7:55am to 9:00am Treatment Type: Individual Therapy  Reported Symptoms: anxiety, depression, still having difficulty focusing and being interested in things (books etc)   Mental Status Exam:  Appearance:   Casual     Behavior:  Appropriate and Sharing  Motor:  Normal  Speech/Language:   Normal Rate  Affect:  anxious, some depression  Mood:  anxious and depressed  Thought process:  normal  Thought content:    WNL  Sensory/Perceptual disturbances:    WNL  Orientation:  oriented to person, place, time/date, situation, day of week, month of year and year  Attention:  Good  Concentration:  Good  Memory:  WNL  Fund of knowledge:   Good  Insight:    Good  Judgment:   Good  Impulse Control:  Good   Risk Assessment: Danger to Self:  No Self-injurious Behavior: No Danger to Others: No Duty to Warn:no Physical Aggression / Violence:No  Access to Firearms a concern: No  Gang Involvement:No   Subjective:  Patient in with the symptoms noted above and having difficulty with negative thoughts such as "rehashing incident/situation at work, feeling betrayed".  Worse in the mornings, better when at work.Job to end in Feb.  Has a couple leads on possible future employment.  Interventions: Cognitive Behavioral Therapy and Solution-Oriented/Positive Psychology  Diagnosis:   ICD-10-CM   1. MDD (major depressive disorder), recurrent episode, moderate ([place])  F33.1      Plan: Patient not signing tx plan on computer screen due to [place].  Treatment Goals: Goals remain the same and progress in being updated beloweach session.We reviewed goals today and talked about progress noted below.  Long Term Goal:  Develop healthy interpersonal relationships that lead to alleviation and help prevent relapse of depression.  Strategy: Patient will  intentionally seek to have more interpersonal relationships that are healthy and support the alleviation of depression.  Progressing- (Patient focusing on short term goal today.)  See below.  Short Term Goal:  Identify and replace depressive thinking that leads to feeling more depressed and leads to depressive actions.  Strategy: Patient will replace negative and self-defeating self-talk with more realistic and positive messages.  Progressing: Patient's anxiety increased some recently as he is trying to resolve the issues and his feelings from work incident that has led to his employment being terminated effective in February 2021.  Picked up from his work last visit as he continues to process his situation and the after-effects it has had on him.  "I don't want to be a bitter, grudge-holding person."  Discussed with him the effects of repressed feelings and holding on to bitter, angry feelings and the effects that has on people.  He seems to realize the negative effects that would have on him.  Discussed things that help him in working through these thoughts including mindful relaxation/deep breathing exercises (slow, intentional), using some of the helpful spiritual apps on prayer and well-being that he already uses, challenging the negative/anxious thoughts and replacing them with more reality-based, positive thoughts, some exercise and time with supportive wife. Used some examples of thought replacement in session with patient.  Continues to have great attitude even in midst of his struggles. Goal review; to remain on meds as prescribed as they seem to be helping per patient report, and progress/efforts/determination noted with patient.    Next appt within 2-3  weeks.   Shanon Ace, LCSW

## 2019-06-15 ENCOUNTER — Encounter: Payer: Self-pay | Admitting: Psychiatry

## 2019-06-15 ENCOUNTER — Other Ambulatory Visit: Payer: Self-pay

## 2019-06-15 ENCOUNTER — Ambulatory Visit (INDEPENDENT_AMBULATORY_CARE_PROVIDER_SITE_OTHER): Admitting: Psychiatry

## 2019-06-15 ENCOUNTER — Other Ambulatory Visit: Payer: Self-pay | Admitting: Family Medicine

## 2019-06-15 DIAGNOSIS — F331 Major depressive disorder, recurrent, moderate: Secondary | ICD-10-CM | POA: Diagnosis not present

## 2019-06-15 DIAGNOSIS — I10 Essential (primary) hypertension: Secondary | ICD-10-CM

## 2019-06-15 DIAGNOSIS — G4700 Insomnia, unspecified: Secondary | ICD-10-CM | POA: Diagnosis not present

## 2019-06-15 DIAGNOSIS — F411 Generalized anxiety disorder: Secondary | ICD-10-CM | POA: Diagnosis not present

## 2019-06-15 MED ORDER — ARIPIPRAZOLE 2 MG PO TABS: 2.0000 mg | ORAL_TABLET | Freq: Every day | ORAL | 0 refills | Status: DC

## 2019-06-15 MED ORDER — DULOXETINE HCL 60 MG PO CPEP: 60.0000 mg | ORAL_CAPSULE | Freq: Every day | ORAL | 1 refills | Status: DC

## 2019-06-15 MED ORDER — TEMAZEPAM 15 MG PO CAPS: 15.0000 mg | ORAL_CAPSULE | Freq: Every evening | ORAL | 2 refills | Status: DC | PRN

## 2019-06-15 NOTE — Progress Notes (Signed)
BURNET ALEN [id number] Mar 10, 1971 48 y.o.  Subjective:   Patient ID:  Joseph Wilcox is a 48 y.o. (DOB 1970/12/25) male.  Chief Complaint:  Chief Complaint  Patient presents with  . Anxiety  . Depression  . Insomnia    HPI Joseph Wilcox presents to the office today for follow-up of depression and anxiety. He reports that he ran out of Temazepam several days ago and has not been sleeping as well. Reports that he was sleeping better with Temazepam. Noticing more anxiety than depression. He reports that he has been going back to bed over the last several days off. He reports that his energy and motivation is ok on days off. He reports some sadness. Reports that he has experienced some rumination when in a quiet place or time. Some catastrophic thinking. Continues to feel antsy at times.   He reports that he is avoiding certain things, like calling someone on the phone, and is unsure if this is due to anxiety, depression, or both. He reports experiencing some nervousness. Appetite has been ok. He reports that he does not crave many foods. He reports that his concentration has been decreased and it is hard for him to stay focused. He reports that he is periodically distracted and is able to redirect thoughts and go back to the task at hand. Denies anhedonia. Reports vague suicidal thoughts have decreased. Denies suicidal intent or plan.   Went to [employer] recently.    Past Psychiatric Medication Trials: Duloxetine- Was taking 60 mg. Helpful for both anxiety and depression. Denies any long-term side effects. Initially had a "tickling, fluttering" sensation in chest.  Abilify- Was taking 2 mg po qd.  Gabapentin- Being prescribed for sciatic nerve pain. Has not noticed any effect on anxiety Ritalin- Took in childhood for ADD.  Temazepam- Effective Trazodone- Nightmares  Review of Systems:  Review of Systems  Musculoskeletal: Positive for arthralgias. Negative for gait problem.        Shoulder pain  Neurological: Negative for tremors.  Psychiatric/Behavioral:       Please refer to HPI   Reports that he stopped taking Amlodipine due to feeling light-headed and one episode of passing out. Reports that light-headedness subsided.    Medications: I have reviewed the patient's current medications.  Current Outpatient Medications  Medication Sig Dispense Refill  . ARIPiprazole (ABILIFY) 2 MG tablet Take 1 tablet (2 mg total) by mouth daily. 90 tablet 0  . omeprazole (PRILOSEC) 20 MG capsule Take 1 capsule (20 mg total) by mouth 2 (two) times daily before a meal. 180 capsule 1  . pravastatin (PRAVACHOL) 20 MG tablet Take 1 tablet (20 mg total) by mouth daily. 90 tablet 3  . amLODipine (NORVASC) 10 MG tablet Take 1 tablet (10 mg total) by mouth daily. (Patient not taking: Reported on 06/15/2019) 90 tablet 1  . DULoxetine (CYMBALTA) 60 MG capsule Take 1 capsule (60 mg total) by mouth daily. 90 capsule 1  . EDARBYCLOR 40-25 MG TABS TAKE 1 TABLET BY MOUTH DAILY 90 tablet 1  . gabapentin (NEURONTIN) 100 MG capsule Start 1 capsule daily, increase by 1 cap every 2-3 days as tolerated up to 3 times a day, or may take 3 at once in evening. (Patient not taking: Reported on 06/15/2019) 90 capsule 1  . meloxicam (MOBIC) 15 MG tablet Take 1 tablet (15 mg total) by mouth daily as needed for pain. (Patient not taking: Reported on 06/15/2019) 30 tablet 0  . temazepam (RESTORIL) 15 MG  capsule Take 1 capsule (15 mg total) by mouth at bedtime as needed for sleep. 30 capsule 2   No current facility-administered medications for this visit.    Medication Side Effects: None  Allergies: No Known Allergies  Past Medical History:  Diagnosis Date  . Allergy   . Anxiety   . Depression   . GERD (gastroesophageal reflux disease)   . High cholesterol   . Hx of migraines     Family History  Problem Relation Age of Onset  . Diabetes Mother   . Prostate cancer Neg Hx   . Colon cancer Neg Hx      Social History   Socioeconomic History  . Marital status: Unknown    Spouse name: Not on file  . Number of children: Not on file  . Years of education: Not on file  . Highest education level: Not on file  Occupational History  . Not on file  Tobacco Use  . Smoking status: Former Smoker    Quit date: 07/05/2010    Years since quitting: 8.9  . Smokeless tobacco: Former Network engineer and Sexual Activity  . Alcohol use: Yes    Alcohol/week: 0.0 standard drinks    Comment: Occasional   . Drug use: No  . Sexual activity: Yes    Partners: Female    Birth control/protection: None  Other Topics Concern  . Not on file  Social History Narrative  . Not on file   Social Determinants of Health   Financial Resource Strain:   . Difficulty of Paying Living Expenses: Not on file  Food Insecurity:   . Worried About Charity fundraiser in the Last Year: Not on file  . Ran Out of Food in the Last Year: Not on file  Transportation Needs:   . Lack of Transportation (Medical): Not on file  . Lack of Transportation (Non-Medical): Not on file  Physical Activity:   . Days of Exercise per Week: Not on file  . Minutes of Exercise per Session: Not on file  Stress:   . Feeling of Stress : Not on file  Social Connections:   . Frequency of Communication with Friends and Family: Not on file  . Frequency of Social Gatherings with Friends and Family: Not on file  . Attends Religious Services: Not on file  . Active Member of Clubs or Organizations: Not on file  . Attends Archivist Meetings: Not on file  . Marital Status: Not on file  Intimate Partner Violence:   . Fear of Current or Ex-Partner: Not on file  . Emotionally Abused: Not on file  . Physically Abused: Not on file  . Sexually Abused: Not on file    Past Medical History, Surgical history, Social history, and Family history were reviewed and updated as appropriate.   Please see review of systems for further details on  the patient's review from today.   Objective:   Physical Exam:  There were no vitals taken for this visit.  Physical Exam Constitutional:      General: He is not in acute distress.    Appearance: He is well-developed.  Musculoskeletal:        General: No deformity.  Neurological:     Mental Status: He is alert and oriented to person, place, and time.     Coordination: Coordination normal.  Psychiatric:        Attention and Perception: Attention and perception normal. He does not perceive auditory or visual hallucinations.  Mood and Affect: Mood is anxious and depressed. Affect is not labile, blunt, angry or inappropriate.        Speech: Speech normal.        Behavior: Behavior normal.        Thought Content: Thought content normal. Thought content is not paranoid or delusional. Thought content does not include homicidal or suicidal ideation. Thought content does not include homicidal or suicidal plan.        Cognition and Memory: Cognition and memory normal.        Judgment: Judgment normal.     Comments: Insight intact     Lab Review:     Component Value Date/Time   NA 139 04/25/2019 0819   K 3.5 04/25/2019 0819   CL 97 (L) 04/25/2019 0819   CO2 32 04/25/2019 0819   GLUCOSE 86 04/25/2019 0819   BUN 11 04/25/2019 0819   CREATININE 1.30 04/25/2019 0819   CALCIUM 9.4 04/25/2019 0819   PROT 6.8 04/25/2019 0819   ALBUMIN 4.3 02/21/2017 1600   AST 17 04/25/2019 0819   ALT 24 04/25/2019 0819   ALKPHOS 59 02/21/2017 1600   BILITOT 0.8 04/25/2019 0819   GFRNONAA 66 04/25/2019 0819   GFRAA 76 04/25/2019 0819       Component Value Date/Time   WBC 10.2 04/25/2019 0819   RBC 5.57 04/25/2019 0819   HGB 12.0 (L) 04/25/2019 0819   HGB 12.6 (L) 02/09/2019 0808   HCT 38.1 (L) 04/25/2019 0819   HCT 39.0 02/09/2019 0808   PLT 342 04/25/2019 0819   PLT 386 02/09/2019 0808   MCV 68.4 (L) 04/25/2019 0819   MCV 69 (L) 02/09/2019 0808   MCH 21.5 (L) 04/25/2019 0819   MCHC  31.5 (L) 04/25/2019 0819   RDW 16.3 (H) 04/25/2019 0819   RDW 17.5 (H) 02/09/2019 0808   LYMPHSABS 2,611 04/25/2019 0819   LYMPHSABS 3.1 02/09/2019 0808   EOSABS 51 04/25/2019 0819   EOSABS 0.1 02/09/2019 0808   BASOSABS 31 04/25/2019 0819   BASOSABS 0.0 02/09/2019 0808    No results found for: POCLITH, LITHIUM   No results found for: PHENYTOIN, PHENOBARB, VALPROATE, CBMZ   .res Assessment: Plan:    AIMS     Office Visit from 06/15/2019 in [place] Visit from 05/04/2019 in [place] Total Score  0  0    GAD-7     Office Visit from 12/01/2018 in [facility] Office Visit from 04/06/2018 in [facility] Office Visit from 10/24/2017 in [facility] Office Visit from 10/09/2015 in [facility]  Total GAD-7 Score  0  0  0  21    PHQ2-9     Office Visit from 05/02/2019 in [facility] Office Visit from 03/30/2019 in [facility] Office Visit from 12/29/2018 in [facility] Office Visit from 12/01/2018 in [facility] Office Visit from 05/25/2018 in [place]  PHQ-2 Total Score  1  3  0  0  0  PHQ-9 Total Score  2  14  1   0  0     Joseph Wilcox was seen today for anxiety, depression and insomnia.  Diagnoses and all orders for this visit:  Generalized anxiety disorder -     DULoxetine (CYMBALTA) 60 MG capsule; Take 1 capsule (60 mg total) by mouth daily.  MDD (major depressive disorder), recurrent episode, moderate ([place]) -  DULoxetine (CYMBALTA) 60 MG capsule; Take 1 capsule (60 mg total) by mouth daily. -     ARIPiprazole (ABILIFY) 2 MG tablet; Take 1 tablet (2 mg total) by mouth daily.  Insomnia, unspecified type -     temazepam (RESTORIL) 15 MG capsule; Take 1 capsule (15 mg total) by mouth at bedtime as needed for sleep.    Pt seen for 30 minutes and greater than 50% of session spent counseling pt  re: potential benefits, risks, and side effects of possible tx options to include increasing Cymbalta to 90 mg po qd to improve mood and anxiety s/s or  Increasing Abilify to 5 mg po qd for further improvement in mood s/s. Discussed that doses of Cymbalta above 60 mg po qd are off label. Pt reports that he would prefer to continue current medications at this time since he reports that s/s are likely due to current psychosocial stressors and upcoming changes that he thinks will improve in the next few months. He also reports that there has been some overall improvement in mood and anxiety s/s. Recommend continuing psychotherapy with Rinaldo Cloud, LCSW. Pt to f/u in 8 weeks or sooner if clinically indicated. Patient advised to contact office with any questions, adverse effects, or acute worsening in signs and symptoms.   Please see After Visit Summary for patient specific instructions.  Future Appointments  Date Time Provider [place]  06/21/2019  8:00 AM Shanon Ace, LCSW [facility] None  07/12/2019  8:00 AM Shanon Ace, LCSW [facility] None  08/07/2019  8:15 AM Edrick Kins, DPM [facility]  08/10/2019  8:30 AM Thayer Headings, PMHNP [facility] None    No orders of the defined types were placed in this encounter.   -------------------------------

## 2019-06-15 NOTE — Progress Notes (Signed)
   06/15/19 1026  Facial and Oral Movements  Muscles of Facial Expression 0  Lips and Perioral Area 0  Jaw 0  Tongue 0  Extremity Movements  Upper (arms, wrists, hands, fingers) 0  Lower (legs, knees, ankles, toes) 0  Trunk Movements  Neck, shoulders, hips 0  Overall Severity  Severity of abnormal movements (highest score from questions above) 0  Incapacitation due to abnormal movements 0  Patient's awareness of abnormal movements (rate only patient's report) 0  AIMS Total Score  AIMS Total Score 0

## 2019-06-21 ENCOUNTER — Ambulatory Visit: Admitting: Psychiatry

## 2019-06-25 ENCOUNTER — Other Ambulatory Visit: Payer: Self-pay | Admitting: Family Medicine

## 2019-06-25 DIAGNOSIS — I10 Essential (primary) hypertension: Secondary | ICD-10-CM

## 2019-07-12 ENCOUNTER — Other Ambulatory Visit: Payer: Self-pay

## 2019-07-12 ENCOUNTER — Ambulatory Visit (INDEPENDENT_AMBULATORY_CARE_PROVIDER_SITE_OTHER): Admitting: Psychiatry

## 2019-07-12 DIAGNOSIS — F411 Generalized anxiety disorder: Secondary | ICD-10-CM

## 2019-07-12 NOTE — Progress Notes (Signed)
[facility] Counselor/Therapist Progress Note  Patient ID: Joseph Wilcox, MRN: [id number],    Date: 07/12/2019  Time Spent: 60 minutes  7:53am to 8:53am  Treatment Type: Individual Therapy  Reported Symptoms: anxiety, some depression, "holidays made it worse"  Mental Status Exam:  Appearance:   Casual     Behavior:  Appropriate, Sharing, difficulty with motivation  Motor:  Normal  Speech/Language:   Normal Rate  Affect:  anxious, some depressed  Mood:  anxious and depressed  Thought process:  normal  Thought content:    WNL  Sensory/Perceptual disturbances:    WNL  Orientation:  oriented to person, place, time/date, situation, day of week, month of year and year  Attention:  Sometims good and have moments of being "Fair"  Concentration:  Good but sometimes with stress is "Fair"  Memory:  WNL  Fund of knowledge:   Good  Insight:    Good  Judgment:   Good  Impulse Control:  Good   Risk Assessment: Danger to Self:  No Self-injurious Behavior: No Danger to Others: No Duty to Warn:no Physical Aggression / Violence:No  Access to Firearms a concern: No  Gang Involvement:No   Subjective: Patient in today with anxiety and some depression.  Tuesday was last day at work and it "went ok, no strong feelings." "It was almost like a weight was lifted off me."  Does have job interview within a week and longer term goal of possibly applying at [employer].  Interventions: Cognitive Behavioral Therapy and Solution-Oriented/Positive Psychology  Diagnosis:   ICD-10-CM   1. Generalized anxiety disorder  F41.1     Plan: Patient not signing tx plan on computer screen due to [place].  Treatment Goals: Goals remain the same and progress in being updated beloweach session.We reviewed goals today and talked about progress noted below.  Long Term Goal: Develop healthy interpersonal relationships that lead to alleviation and help prevent relapse of  depression.  Strategy: Patient will intentionally seek to have more interpersonal relationships that are healthy and support the alleviation of depression.  Progressing- (Patient focusing on short term goal today.)  See below.  Short Term Goal: Identify and replace depressive thinking that leads to feeling more depressed and leads to depressive actions.  Strategy: Patient will replace negative and self-defeating self-talk with more realistic and positive messages.  Progressing: Patient continues work on his goals.  "Any even vague prior thoughts of harming self are "at least 95% gone, with none occurring within past week."  Attributes that to "being around family more and in touch with fact he would never do anything that would impact them such as harming self."  Sense of hopefulness is some stronger. Worked more today on not holding onto ill feelings and grudges, by practicing some re-framing and shifting of specific thoughts that he is having difficulty letting go of.  Self-talk, deep breathing exercises reviewed also.  Cannot do the physical exercise right not due to shoulder and foot problems and getting injections for that.  Very firm in his faith and states that Prayer is his #1 go to coping strategy. Currently he rates his self-talk 50/50 as being positive vs.negative, so patient to work on this between now and next session, specifically being more mindful and catching the negative thoughts and replacing them with more reality-based and self-affirming thoghts and positive self-talk. Goal review and progress noted with patient.  Next appt within 3 weeks.   Shanon Ace, LCSW

## 2019-08-02 ENCOUNTER — Ambulatory Visit (INDEPENDENT_AMBULATORY_CARE_PROVIDER_SITE_OTHER): Admitting: Psychiatry

## 2019-08-02 ENCOUNTER — Other Ambulatory Visit: Payer: Self-pay

## 2019-08-02 DIAGNOSIS — F411 Generalized anxiety disorder: Secondary | ICD-10-CM | POA: Diagnosis not present

## 2019-08-02 NOTE — Progress Notes (Signed)
      [facility] Counselor/Therapist Progress Note  Patient ID: Joseph Wilcox, MRN: [id number],    Date: 08/02/2019  Time Spent: 60 minutes  10:00am to 11:00am  Treatment Type: Individual Therapy  Reported Symptoms: anxiety, depression is definitely decreased  Mental Status Exam:  Appearance:   Well Groomed     Behavior:  Appropriate and Sharing  Motor:  Normal  Speech/Language:   Normal Rate  Affect:  anxious, some depression  Mood:  anxious, depressed and depression is some better  Thought process:  goal directed  Thought content:    WNL  Sensory/Perceptual disturbances:    WNL  Orientation:  oriented to person, place, time/date, situation, day of week, month of year and year  Attention:  Good  Concentration:  Good  Memory:  WNL  Fund of knowledge:   Good  Insight:    Good  Judgment:   Good  Impulse Control:  Good   Risk Assessment: Danger to Self:  No Self-injurious Behavior: No Danger to Others: No Duty to Warn:no Physical Aggression / Violence:No  Access to Firearms a concern: No  Gang Involvement:No   Subjective: Patient in today with anxiety, depression (decreased), some resentment towards former coworker that was hurtful towards patient  Interventions: Cognitive Behavioral Therapy and Solution-Oriented/Positive Psychology  Diagnosis:   ICD-10-CM   1. Generalized anxiety disorder  F41.1    Plan: Patient not signing tx plan on computer screen due to [place].  Treatment Goals: Goals remain the same and progress in being updated beloweach session.We reviewed goals today and talked about progress noted below.  Long Term Goal: Develop healthy interpersonal relationships that lead to alleviation and help prevent relapse of depression.  Strategy: Patient will intentionally seek to have more interpersonal relationships that are healthy and support the alleviation of depression.  Progressing- (Patient focusing on short term goal today.) See  below.  Short Term Goal: Identify and replace depressive thinking that leads to feeling more depressed and leads to depressive actions.  Strategy: Patient will replace negative and self-defeating self-talk with more realistic and positive messages.  Progressing: Patient in today with some contiued stress and anxiety re: now being unemployed.  States his worse times are in the morning. Still struggling with having resentments towards former co-worker and working to resolve these, including in session today.  Talked some about forgiveness needed in order for him to "let go" and live in the present so that he doesn't continue to carry that with him. Good at following through on goal-directed action steps which more recently and today in session was working on replacing self-defeating, negative self-talk with more positive and reality-based self-talk.  To continue working on this and forgiveness issue between now and next session.  Report no current thought of harming self and states that those thoughts remain at least 95% gone overall.  Goal review and progress/challenges/efforts noted with patient.    Next appt within 2-3 weeks.    Shanon Ace, LCSW

## 2019-08-07 ENCOUNTER — Ambulatory Visit: Admitting: Podiatry

## 2019-08-07 ENCOUNTER — Ambulatory Visit (INDEPENDENT_AMBULATORY_CARE_PROVIDER_SITE_OTHER): Admitting: Podiatry

## 2019-08-07 ENCOUNTER — Other Ambulatory Visit: Payer: Self-pay

## 2019-08-07 DIAGNOSIS — M205X1 Other deformities of toe(s) (acquired), right foot: Secondary | ICD-10-CM

## 2019-08-10 ENCOUNTER — Ambulatory Visit (INDEPENDENT_AMBULATORY_CARE_PROVIDER_SITE_OTHER): Admitting: Psychiatry

## 2019-08-10 ENCOUNTER — Encounter: Payer: Self-pay | Admitting: Psychiatry

## 2019-08-10 ENCOUNTER — Other Ambulatory Visit: Payer: Self-pay

## 2019-08-10 VITALS — BP 136/91 | HR 90

## 2019-08-10 DIAGNOSIS — G4700 Insomnia, unspecified: Secondary | ICD-10-CM

## 2019-08-10 DIAGNOSIS — F411 Generalized anxiety disorder: Secondary | ICD-10-CM | POA: Diagnosis not present

## 2019-08-10 DIAGNOSIS — F331 Major depressive disorder, recurrent, moderate: Secondary | ICD-10-CM

## 2019-08-10 MED ORDER — ARIPIPRAZOLE 2 MG PO TABS: 2.0000 mg | ORAL_TABLET | Freq: Every day | ORAL | 0 refills | Status: DC

## 2019-08-10 NOTE — Progress Notes (Signed)
   HPI: 49 year old male presenting today for a follow up evaluation of hallux limitus of the right foot. He states the pain started again in the last couple of weeks. He has not gotten new shoes at this time. He has been taking Meloxicam for treatment. Patient is here for further evaluation and treatment.   Past Medical History:  Diagnosis Date  . Allergy   . Anxiety   . Depression   . GERD (gastroesophageal reflux disease)   . High cholesterol   . Hx of migraines      Physical Exam: General: The patient is alert and oriented x3 in no acute distress.  Dermatology: Skin is warm, dry and supple bilateral lower extremities. Negative for open lesions or macerations.  Vascular: Palpable pedal pulses bilaterally. No edema or erythema noted. Capillary refill within normal limits.  Neurological: Epicritic and protective threshold grossly intact bilaterally.   Musculoskeletal Exam: Pain on palpation with limited range of motion noted to the first MPJ right foot.  Assessment: 1. Hallux limitus right - recurrent  2. H/o cheilectomy right in 2003    Plan of Care:  1. Patient evaluated.  2. Injection of 0.5 mLs Celestone Soluspan injected into the 1st MPJ of the right foot.  3. Continue taking Meloxicam as needed.  4. Return to clinic as needed.   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      Edrick Kins, DPM [facility]  Dr. Edrick Kins, DPM    [address], [place] [address]                Office [phone]  Fax [phone]

## 2019-08-10 NOTE — Progress Notes (Signed)
WILLIIAM Wilcox [id number] 1971/02/26 49 y.o.  Subjective:   Patient ID:  Joseph Wilcox is a 49 y.o. (DOB August 27, 1970) male.  Chief Complaint:  Chief Complaint  Patient presents with  . Anxiety  . Depression    HPI Joseph Wilcox presents to the office today for follow-up of depression and anxiety. On leave until Feb 24th. He reports that he has been applying for various jobs and is expecting a possible job offer. He reports that the last few days he has been trying to get up more the last few days. He reports that he has noticed some increased anxiety and depression since being out of work. He reports that he has been managing it ok. He reports that he has been setting boundaries. Denies physical s/s with anxiety.   He reports that he will have middle of the night awakenings without medication and cannot return to sleep. He reports that he typically sleeps throughout the night with one brief awakening during the night with medication. Appetite has been good. He reports that his energy has been low. Motivation has also been low and has to talk himself into doing things. Interest is low. Enjoys spending time with his wife. Has not been as interested in his usual TV shows or going to a book store. Some worsening concentration. He reports that he had passive death wishes one day earlier this week. Denies SI.   Past Psychiatric Medication Trials: Duloxetine- Was taking 60 mg. Helpful for both anxiety and depression. Denies any long-term side effects. Initially had a "tickling, fluttering" sensation in chest.  Abilify- Was taking 2 mg po qd.  Gabapentin- Being prescribed for sciatic nerve pain. Has not noticed any effect on anxiety Ritalin- Took in childhood for ADD.  Temazepam- Effective Trazodone- Nightmares   AIMS     Office Visit from 06/15/2019 in [facility] Visit from 05/04/2019 in [facility] Total Score  0  0    GAD-7    Office Visit from 12/01/2018 in [facility] Office Visit from 04/06/2018 in [facility] Office Visit from 10/24/2017 in [facility] Office Visit from 10/09/2015 in [facility]  Total GAD-7 Score  0  0  0  21    PHQ2-9     Office Visit from 05/02/2019 in [facility] Office Visit from 03/30/2019 in [facility] Office Visit from 12/29/2018 in [facility] Office Visit from 12/01/2018 in [facility] Office Visit from 05/25/2018 in [facility]  PHQ-2 Total Score  1  3  0  0  0  PHQ-9 Total Score  2  14  1   0  0       Review of Systems:  Review of Systems  Musculoskeletal: Positive for arthralgias. Negative for gait problem.       Shoulder pain  Neurological: Negative for tremors.  Psychiatric/Behavioral:       Please refer to HPI    Medications: I have reviewed the patient's current medications.  Current Outpatient Medications  Medication Sig Dispense Refill  . amLODipine (NORVASC) 10 MG tablet TAKE 1 TABLET(10 MG) BY MOUTH DAILY 90 tablet 1  . ARIPiprazole (ABILIFY) 2 MG tablet Take 1 tablet (2 mg total) by mouth daily. 90 tablet 0  . DULoxetine (CYMBALTA) 60 MG capsule Take 1 capsule (60 mg total) by mouth daily. 90 capsule 1  . EDARBYCLOR 40-25  MG TABS TAKE 1 TABLET BY MOUTH DAILY 90 tablet 1  . omeprazole (PRILOSEC) 20 MG capsule Take 1 capsule (20 mg total) by mouth 2 (two) times daily before a meal. 180 capsule 1  . pravastatin (PRAVACHOL) 20 MG tablet Take 1 tablet (20 mg total) by mouth daily. 90 tablet 3  . temazepam (RESTORIL) 15 MG capsule Take 1 capsule (15 mg total) by mouth at bedtime as needed for sleep. 30 capsule 2  . gabapentin (NEURONTIN) 100 MG capsule Start 1 capsule daily, increase by 1 cap every 2-3 days as tolerated up to 3 times a day, or may take 3 at once in evening. (Patient not taking: Reported on 06/15/2019) 90 capsule 1  .  meloxicam (MOBIC) 15 MG tablet Take 1 tablet (15 mg total) by mouth daily as needed for pain. (Patient not taking: Reported on 06/15/2019) 30 tablet 0   No current facility-administered medications for this visit.    Medication Side Effects: None  Allergies: No Known Allergies  Past Medical History:  Diagnosis Date  . Allergy   . Anxiety   . Depression   . GERD (gastroesophageal reflux disease)   . High cholesterol   . Hx of migraines     Family History  Problem Relation Age of Onset  . Diabetes Mother   . Prostate cancer Neg Hx   . Colon cancer Neg Hx     Social History   Socioeconomic History  . Marital status: Unknown    Spouse name: Not on file  . Number of children: Not on file  . Years of education: Not on file  . Highest education level: Not on file  Occupational History  . Not on file  Tobacco Use  . Smoking status: Former Smoker    Quit date: 07/05/2010    Years since quitting: 9.1  . Smokeless tobacco: Former Network engineer and Sexual Activity  . Alcohol use: Yes    Alcohol/week: 0.0 standard drinks    Comment: Occasional   . Drug use: No  . Sexual activity: Yes    Partners: Female    Birth control/protection: None  Other Topics Concern  . Not on file  Social History Narrative  . Not on file   Social Determinants of Health   Financial Resource Strain:   . Difficulty of Paying Living Expenses: Not on file  Food Insecurity:   . Worried About Charity fundraiser in the Last Year: Not on file  . Ran Out of Food in the Last Year: Not on file  Transportation Needs:   . Lack of Transportation (Medical): Not on file  . Lack of Transportation (Non-Medical): Not on file  Physical Activity:   . Days of Exercise per Week: Not on file  . Minutes of Exercise per Session: Not on file  Stress:   . Feeling of Stress : Not on file  Social Connections:   . Frequency of Communication with Friends and Family: Not on file  . Frequency of Social Gatherings  with Friends and Family: Not on file  . Attends Religious Services: Not on file  . Active Member of Clubs or Organizations: Not on file  . Attends Archivist Meetings: Not on file  . Marital Status: Not on file  Intimate Partner Violence:   . Fear of Current or Ex-Partner: Not on file  . Emotionally Abused: Not on file  . Physically Abused: Not on file  . Sexually Abused: Not on file  Past Medical History, Surgical history, Social history, and Family history were reviewed and updated as appropriate.   Please see review of systems for further details on the patient's review from today.   Objective:   Physical Exam:  BP (!) 136/91   Pulse 90   Physical Exam Neurological:     Mental Status: He is alert and oriented to person, place, and time.     Cranial Nerves: No dysarthria.  Psychiatric:        Attention and Perception: Attention and perception normal.        Mood and Affect: Mood is anxious and depressed.        Speech: Speech normal.        Behavior: Behavior is cooperative.        Thought Content: Thought content normal. Thought content is not paranoid or delusional. Thought content does not include homicidal or suicidal ideation. Thought content does not include homicidal or suicidal plan.        Cognition and Memory: Cognition and memory normal.        Judgment: Judgment normal.     Comments: Insight intact     Lab Review:     Component Value Date/Time   NA 139 04/25/2019 0819   K 3.5 04/25/2019 0819   CL 97 (L) 04/25/2019 0819   CO2 32 04/25/2019 0819   GLUCOSE 86 04/25/2019 0819   BUN 11 04/25/2019 0819   CREATININE 1.30 04/25/2019 0819   CALCIUM 9.4 04/25/2019 0819   PROT 6.8 04/25/2019 0819   ALBUMIN 4.3 02/21/2017 1600   AST 17 04/25/2019 0819   ALT 24 04/25/2019 0819   ALKPHOS 59 02/21/2017 1600   BILITOT 0.8 04/25/2019 0819   GFRNONAA 66 04/25/2019 0819   GFRAA 76 04/25/2019 0819       Component Value Date/Time   WBC 10.2 04/25/2019  0819   RBC 5.57 04/25/2019 0819   HGB 12.0 (L) 04/25/2019 0819   HGB 12.6 (L) 02/09/2019 0808   HCT 38.1 (L) 04/25/2019 0819   HCT 39.0 02/09/2019 0808   PLT 342 04/25/2019 0819   PLT 386 02/09/2019 0808   MCV 68.4 (L) 04/25/2019 0819   MCV 69 (L) 02/09/2019 0808   MCH 21.5 (L) 04/25/2019 0819   MCHC 31.5 (L) 04/25/2019 0819   RDW 16.3 (H) 04/25/2019 0819   RDW 17.5 (H) 02/09/2019 0808   LYMPHSABS 2,611 04/25/2019 0819   LYMPHSABS 3.1 02/09/2019 0808   EOSABS 51 04/25/2019 0819   EOSABS 0.1 02/09/2019 0808   BASOSABS 31 04/25/2019 0819   BASOSABS 0.0 02/09/2019 0808    No results found for: POCLITH, LITHIUM   No results found for: PHENYTOIN, PHENOBARB, VALPROATE, CBMZ   .res Assessment: Plan:   Discussed making changes to tx plan since pt is experiencing some depressive s/s that are interfering with function and he is continuing to report anxiety s/s. Pt reports that he would prefer to continue current medications since he has seen some continued improvement in mood and anxiety s/s and he thinks mood and anxiety s/s are likely situational and should improve once he starts a new job. Will continue Cymbalta 60 mg po qd for depression and anxiety.  Continue Abilify 2 mg po qd for depression. Continue Temazepam 15 mg po QHS prn insomnia. Recommend continuing psychotherapy with Rinaldo Cloud, LCSW.  Patient advised to contact office with any questions, adverse effects, or acute worsening in signs and symptoms.  Kathleen was seen today for anxiety and depression.  Diagnoses and all orders for this visit:  MDD (major depressive disorder), recurrent episode, moderate (HCC) -     ARIPiprazole (ABILIFY) 2 MG tablet; Take 1 tablet (2 mg total) by mouth daily.  Generalized anxiety disorder  Insomnia, unspecified type     Please see After Visit Summary for patient specific instructions.  Future Appointments  Date Time Provider [place]  08/16/2019  9:00 AM Shanon Ace,  LCSW [facility] None  10/08/2019  9:00 AM Thayer Headings, PMHNP [facility] None    No orders of the defined types were placed in this encounter.   -------------------------------

## 2019-08-16 ENCOUNTER — Other Ambulatory Visit: Payer: Self-pay

## 2019-08-16 ENCOUNTER — Ambulatory Visit (INDEPENDENT_AMBULATORY_CARE_PROVIDER_SITE_OTHER): Admitting: Psychiatry

## 2019-08-16 DIAGNOSIS — F331 Major depressive disorder, recurrent, moderate: Secondary | ICD-10-CM | POA: Diagnosis not present

## 2019-08-16 NOTE — Progress Notes (Signed)
[facility] Counselor/Therapist Progress Note  Patient ID: Joseph Wilcox, MRN: [id number],    Date: 08/16/2019  Time Spent: 60 minutes 9:00am to 10:00am  Treatment Type: Individual Therapy  Reported Symptoms: anxiety, depression,   Mental Status Exam:  Appearance:   Casual     Behavior:  Appropriate and Sharing  Motor:  Normal  Speech/Language:   Normal Rate  Affect:  anxious, some depression ("but improved")  Mood:  anxious, depressed and "but also hopeful now"  Thought process:  normal  Thought content:    WNL  Sensory/Perceptual disturbances:    WNL  Orientation:  oriented to person, place, time/date, situation, day of week, month of year and year  Attention:  Good  Concentration:  Good  Memory:  WNL  Fund of knowledge:   Good  Insight:    Good  Judgment:   Good  Impulse Control:  Good   Risk Assessment: Danger to Self:  No Self-injurious Behavior: No Danger to Others: No Duty to Warn:no Physical Aggression / Violence:No  Access to Firearms a concern: No  Gang Involvement:No   Subjective: Patient today reports bad, anxious,depressing week last week.  Some SI. Better at this point and reports being more motivated today to work on goal directed thinking and behaviors.    Interventions: Cognitive Behavioral Therapy and Solution-Oriented/Positive Psychology  Diagnosis:   ICD-10-CM   1. MDD (major depressive disorder), recurrent episode, moderate ([place])  F33.1      Plan: Patient not signing tx plan on computer screen due to [place].  Treatment Goals: Goals remain the same and progress in being updated beloweach session.We reviewed goals today and talked about progress noted below.  Long Term Goal: Develop healthy interpersonal relationships that lead to alleviation and help prevent relapse of depression.  Strategy: Patient will intentionally seek to have more interpersonal relationships that are healthy and support the alleviation of  depression.  Short Term Goal: Identify and replace depressive thinking that leads to feeling more depressed and leads to depressive actions.  Strategy: Patient will replace negative and self-defeating self-talk with more realistic and positive messages.  Progressing: Patient today reported a recent spike in depression last week and included some suicidal ideation. States he told his wife and she was very supportive and helped him in letting go of the SI.  Also shared that praying helped him as he is a very faith-based person.  States that he is not having any SI today.  Applying for several different jobs at this point.  Trying to help out with household chores during this time of not working a job.  Applying for disability. Reports several physical issues including shoulder pain, foot pain since prior surgery, sleep apnea, acid reflux. Sleep is helped by C-pap job. Patient admits to some continued anxious/negative thinking at times, leading to him feeling increased anxiety and negativity re: himself and others.  Processed today some of his negativity especially about incident that led to his job loss and coming for therapy. Was very open and states that he is working hard not to "harbor negative feelings and I'm wanting to eventually be able to let it go."  Wants to be able to work another job, not be stressing over the job loss with the [employer] and not be re-hashing or stressing as much over it.Wants to be able to forgive and move forward to have a sense of relief.  Shares that he's struggled with grudges and wants to be free of that. Decides  that "I need to put my [employer] uniforms away where I don't see them everyday.  Feels this forgiveness is essential for his improved mental health and moving forward.  Still denies any SI. Goal review and progress/challenges noted with patient.   Next appt within 2-3 weeks.    Shanon Ace, LCSW

## 2019-08-22 ENCOUNTER — Encounter: Payer: Self-pay | Admitting: Family Medicine

## 2019-08-30 ENCOUNTER — Ambulatory Visit: Admitting: Psychiatry

## 2019-09-13 ENCOUNTER — Ambulatory Visit: Admitting: Psychiatry

## 2019-09-27 ENCOUNTER — Other Ambulatory Visit: Payer: Self-pay

## 2019-09-27 ENCOUNTER — Ambulatory Visit (INDEPENDENT_AMBULATORY_CARE_PROVIDER_SITE_OTHER): Admitting: Psychiatry

## 2019-09-27 DIAGNOSIS — F331 Major depressive disorder, recurrent, moderate: Secondary | ICD-10-CM

## 2019-09-27 NOTE — Progress Notes (Signed)
      [facility] Counselor/Therapist Progress Note  Patient ID: Joseph Wilcox, MRN: [id number],    Date: 09/27/2019  Time Spent: 60 minutes   8:00am to 9:00am  Treatment Type: Individual Therapy  Reported Symptoms: anxiety, depression," lack of interest in some things"  Mental Status Exam:  Appearance:   Casual     Behavior:  Appropriate and Sharing  Motor:  Normal  Speech/Language:   Normal Rate  Affect:  some anxiety and some depression, "Anxiety more prominent"  Mood:  anxious  Thought process:  Normal  Thought content:    WNL  Sensory/Perceptual disturbances:    WNL  Orientation:  oriented to person, place, time/date, situation, day of week, month of year and year  Attention:  Good  Concentration:  Good  Memory:  WNL  Fund of knowledge:   Good  Insight:    Good  Judgment:   Good  Impulse Control:  Good   Risk Assessment: Danger to Self:  No Self-injurious Behavior: No Danger to Others: No Duty to Warn:no Physical Aggression / Violence:No  Access to Firearms a concern: No  Gang Involvement:No   Subjective: Patient report depression is better, no SI , having a job is helping, not dwelling on past as much.  Anxiety is more prominent symptom.  Likes new job but wife does not as he has to work weekends frequently.  Interventions: Cognitive Behavioral Therapy and Ego-Supportive  Diagnosis:   ICD-10-CM   1. MDD (major depressive disorder), recurrent episode, moderate ([place])  F33.1     Plan: Patient not signing tx plan on computer screen due to [place].  Treatment Goals: Goals remain the same and progress in being updated beloweach session.We reviewed goals today and talked about progress noted below.  Long Term Goal: Develop healthy interpersonal relationships that lead to alleviation and help prevent relapse of depression.  Strategy: Patient will intentionally seek to have more interpersonal relationships that are healthy and support the  alleviation of depression.  Short Term Goal: Identify and replace depressive thinking that leads to feeling more depressed and leads to depressive actions.  Strategy: Patient will replace negative and self-defeating self-talk with more realistic and positive messages.  Progressing: Patient in today with decreased depression, and anxiety persists. Has started new job and likes it, people treat him well.  Wife doesn't like that he has to work some weekends.  The drive to work "is my worst times as it gives me time to think and I often dwell on the past."  Discussed ways to stay more in the present including visual and behavioral reminders, being mindful of what he can control and what he cannot with his focus being on what he can control.  He uses his faith as a support .  Worked on his anxious thought and being able to interrupt them and replace with more positive, realistic, and empowering thought patterns that do not support anxiety.  Discussed need for more positive self-talk and "looking for what may go right versus always looking for what may go wrong.  Denies any SI, states depression is much less with anxiety being stronger and plans to follow up on strategies to better manage it.  Goal review and progress noted with patient.  Next appt within 2-3 weeks.   Shanon Ace, LCSW

## 2019-10-02 ENCOUNTER — Other Ambulatory Visit: Payer: Self-pay

## 2019-10-02 DIAGNOSIS — F411 Generalized anxiety disorder: Secondary | ICD-10-CM

## 2019-10-02 DIAGNOSIS — F331 Major depressive disorder, recurrent, moderate: Secondary | ICD-10-CM

## 2019-10-02 MED ORDER — DULOXETINE HCL 60 MG PO CPEP: 60.0000 mg | ORAL_CAPSULE | Freq: Every day | ORAL | 0 refills | Status: DC

## 2019-10-08 ENCOUNTER — Ambulatory Visit: Admitting: Psychiatry

## 2019-12-30 ENCOUNTER — Other Ambulatory Visit: Payer: Self-pay | Admitting: Family Medicine

## 2019-12-30 DIAGNOSIS — I10 Essential (primary) hypertension: Secondary | ICD-10-CM

## 2019-12-30 NOTE — Telephone Encounter (Signed)
Requested Prescriptions  Pending Prescriptions Disp Refills  . EDARBYCLOR 40-25 MG TABS [Pharmacy Med Name: EDARBYCLOR 40/25MG  TABLETS] 30 tablet 0    Sig: TAKE 1 TABLET BY MOUTH DAILY     Cardiovascular: ARB + Diuretic Combos Failed - 12/30/2019  7:14 AM      Failed - K in normal range and within 180 days    Potassium  Date Value Ref Range Status  04/25/2019 3.5 3.5 - 5.3 mmol/L Final         Failed - Na in normal range and within 180 days    Sodium  Date Value Ref Range Status  04/25/2019 139 135 - 146 mmol/L Final         Failed - Cr in normal range and within 180 days    Creat  Date Value Ref Range Status  04/25/2019 1.30 0.60 - 1.35 mg/dL Final         Failed - Ca in normal range and within 180 days    Calcium  Date Value Ref Range Status  04/25/2019 9.4 8.6 - 10.3 mg/dL Final         Failed - Last BP in normal range    BP Readings from Last 1 Encounters:  05/02/19 132/72         Failed - Valid encounter within last 6 months    Recent Outpatient Visits          8 months ago Annual physical exam   [place], DO   9 months ago Sciatica of right side without back pain   [place], DO   1 year ago Elevated hemoglobin A1c   [facility] Olin Hauser, DO   1 year ago Essential hypertension   [facility] Olin Hauser, DO   1 year ago Annual physical exam   [facility] Olin Hauser, DO             Passed - Patient is not pregnant

## 2019-12-31 ENCOUNTER — Other Ambulatory Visit: Payer: Self-pay

## 2019-12-31 DIAGNOSIS — F411 Generalized anxiety disorder: Secondary | ICD-10-CM

## 2019-12-31 DIAGNOSIS — F331 Major depressive disorder, recurrent, moderate: Secondary | ICD-10-CM

## 2019-12-31 MED ORDER — DULOXETINE HCL 60 MG PO CPEP: 60.0000 mg | ORAL_CAPSULE | Freq: Every day | ORAL | 0 refills | Status: DC

## 2020-01-29 ENCOUNTER — Other Ambulatory Visit: Payer: Self-pay | Admitting: Family Medicine

## 2020-01-29 ENCOUNTER — Ambulatory Visit (INDEPENDENT_AMBULATORY_CARE_PROVIDER_SITE_OTHER): Admitting: Family Medicine

## 2020-01-29 ENCOUNTER — Other Ambulatory Visit: Payer: Self-pay

## 2020-01-29 ENCOUNTER — Encounter: Payer: Self-pay | Admitting: Family Medicine

## 2020-01-29 VITALS — BP 130/84 | HR 87 | Temp 97.3°F | Resp 16 | Ht 73.0 in | Wt 302.0 lb

## 2020-01-29 DIAGNOSIS — M75112 Incomplete rotator cuff tear or rupture of left shoulder, not specified as traumatic: Secondary | ICD-10-CM

## 2020-01-29 DIAGNOSIS — Z9989 Dependence on other enabling machines and devices: Secondary | ICD-10-CM

## 2020-01-29 DIAGNOSIS — M205X1 Other deformities of toe(s) (acquired), right foot: Secondary | ICD-10-CM

## 2020-01-29 DIAGNOSIS — F3341 Major depressive disorder, recurrent, in partial remission: Secondary | ICD-10-CM

## 2020-01-29 DIAGNOSIS — E782 Mixed hyperlipidemia: Secondary | ICD-10-CM

## 2020-01-29 DIAGNOSIS — I10 Essential (primary) hypertension: Secondary | ICD-10-CM

## 2020-01-29 DIAGNOSIS — G4733 Obstructive sleep apnea (adult) (pediatric): Secondary | ICD-10-CM | POA: Diagnosis not present

## 2020-01-29 MED ORDER — EDARBYCLOR 40-25 MG PO TABS: 1.0000 | ORAL_TABLET | Freq: Every day | ORAL | 3 refills | Status: DC

## 2020-01-29 MED ORDER — PRAVASTATIN SODIUM 20 MG PO TABS: 20.0000 mg | ORAL_TABLET | Freq: Every day | ORAL | 3 refills | Status: DC

## 2020-01-29 MED ORDER — AMLODIPINE BESYLATE 10 MG PO TABS: 10.0000 mg | ORAL_TABLET | Freq: Every day | ORAL | 3 refills | Status: AC

## 2020-01-29 NOTE — Assessment & Plan Note (Signed)
Mild elevated BP. Improved on re-check Home readings limited On CPAP for OSA    Plan:  1. Continue current Amlodipine 10mg  daily, Edarbyclor 40-25mg  daily - refills 2. Encourage improved lifestyle - low sodium diet, regular exercise 3. Continue improving monitor BP outside office, bring readings to next visit, if persistently >140/90 or new symptoms notify office sooner

## 2020-01-29 NOTE — Telephone Encounter (Signed)
Requested medication (s) are due for refill today: Yes  Requested medication (s) are on the active medication list: Yes  Last refill:  12/30/19  Future visit scheduled: Yes  Notes to clinic:  Pt. Has an appointment for today.    Requested Prescriptions  Pending Prescriptions Disp Refills   EDARBYCLOR 40-25 MG TABS [Pharmacy Med Name: EDARBYCLOR 40/25MG  TABLETS] 30 tablet 0    Sig: TAKE 1 TABLET BY MOUTH DAILY      Cardiovascular: ARB + Diuretic Combos Failed - 01/29/2020  7:11 AM      Failed - K in normal range and within 180 days    Potassium  Date Value Ref Range Status  04/25/2019 3.5 3.5 - 5.3 mmol/L Final          Failed - Na in normal range and within 180 days    Sodium  Date Value Ref Range Status  04/25/2019 139 135 - 146 mmol/L Final          Failed - Cr in normal range and within 180 days    Creat  Date Value Ref Range Status  04/25/2019 1.30 0.60 - 1.35 mg/dL Final          Failed - Ca in normal range and within 180 days    Calcium  Date Value Ref Range Status  04/25/2019 9.4 8.6 - 10.3 mg/dL Final          Failed - Last BP in normal range    BP Readings from Last 1 Encounters:  05/02/19 132/72          Failed - Valid encounter within last 6 months    Recent Outpatient Visits           9 months ago Annual physical exam   [place], DO   10 months ago Sciatica of right side without back pain   [place], DO   1 year ago Elevated hemoglobin A1c   [facility] Olin Hauser, DO   1 year ago Essential hypertension   [place], Devonne Doughty, DO   1 year ago Annual physical exam   [place], Devonne Doughty, DO       Future Appointments             Today Olin Hauser, [facility], [place] - Patient is not pregnant

## 2020-01-29 NOTE — Assessment & Plan Note (Signed)
Significantly improved mood, depression now in partial remission Chronic anxiety Followed by [facility] in [place] On medicine Rarely on sleeping med 

## 2020-01-29 NOTE — Assessment & Plan Note (Addendum)
Recent improvement But still Persistent problem, worsening R foot pain and limitation Limiting his lower body and fitness training unable to maintain fitness with walk/run training  Considered to be permanent condition lasting >1 year - may benefit from surgery  Continues treatment with Dr Amalia Hailey Podiat[facility] for further management

## 2020-01-29 NOTE — Assessment & Plan Note (Signed)
Previously improved Due lipid panel upcoming  Plan: 1. Continue current meds - Pravastatin 20mg  daily -refill 2. Encourage improved lifestyle - low carb/cholesterol, reduce portion size, continue improving regular exercise

## 2020-01-29 NOTE — Assessment & Plan Note (Signed)
Improved s/p injection therapy Goal to delay surgery Still Persistent problem, worsening L shoulder pain with partial thickness tear Limiting his upper body strength training and ability to function with carrying restriction < 20 lbs and unable to wear/carry protective gear.  Considered to be permanent condition lasting >1 year - may benefit from surgery  Continues treatment with Dr Mack Guise and Orthopedics for further management, may benefit from future rotator cuff surgical repair if indicated.

## 2020-01-29 NOTE — Progress Notes (Signed)
Subjective:    Patient ID: Joseph Wilcox, male    DOB: 08/23/1970, 49 y.o.   MRN: [id number]  Joseph Wilcox is a 49 y.o. male presenting on 01/29/2020 for Medication Refill and Hypertension   HPI   Pre-Diabetes/ Obesity BMI >39 Last A1c 5.9, stable over past 1 year. Fam history of DM Lifestyle - Diet: Balanced diet, limit starches, reduce portions - Exercise: Currently not actively exercising due to shoulder and leg issues Weight increased 10-11 lbs  CHRONIC HTN: Improved control. Limited home readings lately had been normal. Current Meds -Edarbyclor 40-25mg  daily, Amlodipine 10mg  Reports good compliance, took meds today. Tolerating well, w/o complaints. Denies CP, dyspnea, HA, edema  OSA, on CPAP - Patient reports prior history of dx OSA and on CPAP - Today reports that sleep apnea is well controlled.Heuses the CPAP machine every night. Tolerates the machine well, and thinks that sleeps much better with it and feels good. No new concerns or symptoms.  Additional updates  Hallux Limitus, Right Followed by Dr Amalia Hailey Podiat[facility], doing better, receiving steroid injections. Improved function.  Partial Thickness Tear Rotator Cuff (Left) Additional history - Low Back Pain, Sciatica Followed by Ortho with L shoulder injections with improvement No surgery at this time. He is trying to avoid. 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 Still on meds Abilify, Duloxetine - Rarely taking Temazepam 15mg  nightly PRN. Now sleeping better on CPAP.  Health Maintenance: UTD Flu Vaccine  Colon CA Screening: Last Colonoscopy  03/09/2019 (done by Dr Vicente Males AGI), results with 1 hyperplastic polyp, good for 10 years - next due in 2030. Currently asymptomatic. No known family history of colon CA.    Depression screen [facility] 2/9 01/29/2020 05/02/2019 03/30/2019  Decreased Interest 1 1 1   Down, Depressed, Hopeless 1 0 2  PHQ - 2 Score 2 1 3   Altered sleeping 0 0 2  Tired, decreased energy 1 1 2   Change in appetite 1 0 2  Feeling bad or failure about yourself  0 0 2  Trouble concentrating 0 0 2  Moving slowly or fidgety/restless 0 0 0  Suicidal thoughts 0 0 1  PHQ-9 Score 4 2 14   Difficult doing work/chores Not difficult at all Not difficult at all Somewhat difficult  Some recent data might be hidden   GAD 7 : Generalized Anxiety Score 12/01/2018 04/06/2018 10/24/2017 10/09/2015  Nervous, Anxious, on Edge 0 0 0 3  Control/stop worrying 0 0 0 3  Worry too much - different things 0 0 0 3  Trouble relaxing 0 0 0 3  Restless 0 0 0 3  Easily annoyed or irritable 0 0 0 3  Afraid - awful might happen 0 0 0 3  Total GAD 7 Score 0 0 0 21  Anxiety Difficulty Not difficult at all Not difficult at all Not difficult at all Extremely difficult      Social History   Tobacco Use  . Smoking status: Former Smoker    Quit date: 07/05/2010    Years since quitting: 9.5  . Smokeless tobacco: Former Network engineer  . Vaping Use: Never used  Substance  Use Topics  . Alcohol use: Yes    Alcohol/week: 0.0 standard drinks    Comment: Occasional   . Drug use: No    Review of Systems Per HPI unless specifically indicated above     Objective:    BP (!) 130/84 (BP Location: Left Arm, Cuff Size: Normal)   Pulse 87   Temp (!) 97.3 F (36.3 C) (Temporal)   Resp 16   Ht 6\' 1"  (1.854 m)   Wt (!) 302 lb (137 kg)   SpO2 97%   BMI 39.84 kg/m   Wt Readings from Last 3 Encounters:  01/29/20 (!) 302 lb (137 kg)  05/02/19 291 lb (132 kg)  03/30/19 293 lb (132.9 kg)    Physical Exam Vitals and nursing note reviewed.    Constitutional:      General: He is not in acute distress.    Appearance: He is well-developed. He is obese. He is not diaphoretic.     Comments: Well-appearing, comfortable, cooperative  HENT:     Head: Normocephalic and atraumatic.  Eyes:     General:        Right eye: No discharge.        Left eye: No discharge.     Conjunctiva/sclera: Conjunctivae normal.  Cardiovascular:     Rate and Rhythm: Normal rate.  Pulmonary:     Effort: Pulmonary effort is normal.  Skin:    General: Skin is warm and dry.     Findings: No erythema or rash.  Neurological:     Mental Status: He is alert and oriented to person, place, and time.  Psychiatric:        Behavior: Behavior normal.     Comments: Well groomed, good eye contact, normal speech and thoughts        Results for orders placed or performed in visit on 04/25/19  Lipid panel  Result Value Ref Range   Cholesterol 234 (H) <200 mg/dL   HDL 57 > OR = 40 mg/dL   Triglycerides 144 <150 mg/dL   LDL Cholesterol (Calc) 150 (H) mg/dL (calc)   Total CHOL/HDL Ratio 4.1 <5.0 (calc)   Non-HDL Cholesterol (Calc) 177 (H) <130 mg/dL (calc)  COMPLETE METABOLIC PANEL WITH GFR  Result Value Ref Range   Glucose, Bld 86 65 - 99 mg/dL   BUN 11 7 - 25 mg/dL   Creat 1.30 0.60 - 1.35 mg/dL   GFR, Est Non African American 66 > OR = 60 mL/min/1.8m2   GFR, Est African American 76 > OR = 60 mL/min/1.69m2   BUN/Creatinine Ratio NOT APPLICABLE 6 - 22 (calc)   Sodium 139 135 - 146 mmol/L   Potassium 3.5 3.5 - 5.3 mmol/L   Chloride 97 (L) 98 - 110 mmol/L   CO2 32 20 - 32 mmol/L   Calcium 9.4 8.6 - 10.3 mg/dL   Total Protein 6.8 6.1 - 8.1 g/dL   Albumin 4.3 3.6 - 5.1 g/dL   Globulin 2.5 1.9 - 3.7 g/dL (calc)   AG Ratio 1.7 1.0 - 2.5 (calc)   Total Bilirubin 0.8 0.2 - 1.2 mg/dL   Alkaline phosphatase (APISO) 65 36 - 130 U/L   AST 17 10 - 40 U/L   ALT 24 9 - 46 U/L  CBC with Differential/Platelet  Result Value Ref Range   WBC 10.2 3.8 - 10.8  Thousand/uL   RBC 5.57 4.20 - 5.80 Million/uL   Hemoglobin 12.0 (L) 13.2 - 17.1 g/dL   HCT 38.1 (L) 38 -  50 %   MCV 68.4 (L) 80.0 - 100.0 fL   MCH 21.5 (L) 27.0 - 33.0 pg   MCHC 31.5 (L) 32.0 - 36.0 g/dL   RDW 16.3 (H) 11.0 - 15.0 %   Platelets 342 140 - 400 Thousand/uL   MPV 9.2 7.5 - 12.5 fL   Neutro Abs 6,967 1,500 - 7,800 cells/uL   Lymphs Abs 2,611 850 - 3,900 cells/uL   Absolute Monocytes 541 200 - 950 cells/uL   Eosinophils Absolute 51 15 - 500 cells/uL   Basophils Absolute 31 0 - 200 cells/uL   Neutrophils Relative % 68.3 %   Total Lymphocyte 25.6 %   Monocytes Relative 5.3 %   Eosinophils Relative 0.5 %   Basophils Relative 0.3 %   Smear Review    Hemoglobin A1c  Result Value Ref Range   Hgb A1c MFr Bld 5.9 (H) <5.7 % of total Hgb   Mean Plasma Glucose 123 (calc)   eAG (mmol/L) 6.8 (calc)      Assessment & Plan:   Problem List Items Addressed This Visit    OSA on CPAP    Improved control now of chronic OSA on CPAP - Good adherence to CPAP nightly now wear most of night, tolerating - Continue current CPAP therapy, patient seems to be benefiting from therapy      Nontraumatic incomplete tear of left rotator cuff    Improved s/p injection therapy Goal to delay surgery Still Persistent problem, worsening L shoulder pain with partial thickness tear Limiting his upper body strength training and ability to function with carrying restriction < 20 lbs and unable to wear/carry protective gear.  Considered to be permanent condition lasting >1 year - may benefit from surgery  Continues treatment with Dr Mack Guise and Orthopedics for further management, may benefit from future rotator cuff surgical repair if indicated.      Mixed hyperlipidemia    Previously improved Due lipid panel upcoming  Plan: 1. Continue current meds - Pravastatin 20mg  daily -refill 2. Encourage improved lifestyle - low carb/cholesterol, reduce portion size, continue improving regular  exercise      Relevant Medications   EDARBYCLOR 40-25 MG TABS   pravastatin (PRAVACHOL) 20 MG tablet   amLODipine (NORVASC) 10 MG tablet   Major depressive disorder, recurrent, in partial remission (HCC)    Significantly improved mood, depression now in partial remission Chronic anxiety Followed by [facility] in [place] On medicine Rarely on sleeping med      Hallux limitus, right    Recent improvement But still Persistent problem, worsening R foot pain and limitation Limiting his lower body and fitness training unable to maintain fitness with walk/run training  Considered to be permanent condition lasting >1 year - may benefit from surgery  Continues treatment with Dr Amalia Hailey Podiatry for further management      Essential hypertension - Primary    Mild elevated BP. Improved on re-check Home readings limited On CPAP for OSA    Plan:  1. Continue current Amlodipine 10mg  daily, Edarbyclor 40-25mg  daily - refills 2. Encourage improved lifestyle - low sodium diet, regular exercise 3. Continue improving monitor BP outside office, bring readings to next visit, if persistently >140/90 or new symptoms notify office sooner      Relevant Medications   EDARBYCLOR 40-25 MG TABS   pravastatin (PRAVACHOL) 20 MG tablet   amLODipine (NORVASC) 10 MG tablet        Meds ordered this encounter  Medications  . EDARBYCLOR 40-25  MG TABS    Sig: Take 1 tablet by mouth daily.    Dispense:  90 tablet    Refill:  3  . pravastatin (PRAVACHOL) 20 MG tablet    Sig: Take 1 tablet (20 mg total) by mouth daily.    Dispense:  90 tablet    Refill:  3  . amLODipine (NORVASC) 10 MG tablet    Sig: Take 1 tablet (10 mg total) by mouth daily.    Dispense:  90 tablet    Refill:  3      Follow up plan: Return in about 3 months (around 04/30/2020) for Follow-up 3 months Annual Physical AM fasting and lab AFTER apt.  Labs will be ordered day of his appointment for Annual  Physical can add Hep C.  Nobie Putnam, [facility] 01/29/2020, 2:29 PM

## 2020-01-29 NOTE — Assessment & Plan Note (Signed)
Improved control now of chronic OSA on CPAP - Good adherence to CPAP nightly now wear most of night, tolerating - Continue current CPAP therapy, patient seems to be benefiting from therapy 

## 2020-01-29 NOTE — Patient Instructions (Addendum)
Thank you for coming to the office today.  Refilled BP meds x 2 and cholesterol med for 90 days plus 3 refills good for 1 year.  We can do annual physical and labs within 3 months. All in one day.  Check with Behavioral Health for refills on their meds - and schedule follow-up with them.  Keep track of BP occasionally  Goal to improve weight and lifestyle as discussed  Please schedule a Follow-up Appointment to: Return in about 3 months (around 04/30/2020) for Follow-up 3 months Annual Physical AM fasting and lab AFTER apt.  If you have any other questions or concerns, please feel free to call the office or send a message through [place]. You may also schedule an earlier appointment if necessary.  Additionally, you may be receiving a survey about your experience at our office within a few days to 1 week by e-mail or mail. We value your feedback.  Nobie Putnam, DO [place]

## 2020-01-30 ENCOUNTER — Other Ambulatory Visit: Payer: Self-pay

## 2020-01-30 DIAGNOSIS — F411 Generalized anxiety disorder: Secondary | ICD-10-CM

## 2020-01-30 DIAGNOSIS — F331 Major depressive disorder, recurrent, moderate: Secondary | ICD-10-CM

## 2020-01-30 MED ORDER — DULOXETINE HCL 60 MG PO CPEP: 60.0000 mg | ORAL_CAPSULE | Freq: Every day | ORAL | 0 refills | Status: DC

## 2020-04-30 ENCOUNTER — Other Ambulatory Visit: Payer: Self-pay

## 2020-04-30 ENCOUNTER — Encounter: Payer: Self-pay | Admitting: Family Medicine

## 2020-04-30 ENCOUNTER — Ambulatory Visit (INDEPENDENT_AMBULATORY_CARE_PROVIDER_SITE_OTHER): Admitting: Family Medicine

## 2020-04-30 VITALS — BP 136/80 | HR 76 | Temp 96.8°F | Resp 16 | Ht 73.0 in | Wt 293.0 lb

## 2020-04-30 DIAGNOSIS — E782 Mixed hyperlipidemia: Secondary | ICD-10-CM

## 2020-04-30 DIAGNOSIS — G4733 Obstructive sleep apnea (adult) (pediatric): Secondary | ICD-10-CM | POA: Diagnosis not present

## 2020-04-30 DIAGNOSIS — Z0000 Encounter for general adult medical examination without abnormal findings: Secondary | ICD-10-CM | POA: Diagnosis not present

## 2020-04-30 DIAGNOSIS — Z23 Encounter for immunization: Secondary | ICD-10-CM

## 2020-04-30 DIAGNOSIS — I10 Essential (primary) hypertension: Secondary | ICD-10-CM | POA: Diagnosis not present

## 2020-04-30 DIAGNOSIS — Z9989 Dependence on other enabling machines and devices: Secondary | ICD-10-CM

## 2020-04-30 DIAGNOSIS — R6882 Decreased libido: Secondary | ICD-10-CM

## 2020-04-30 DIAGNOSIS — R7309 Other abnormal glucose: Secondary | ICD-10-CM

## 2020-04-30 DIAGNOSIS — E669 Obesity, unspecified: Secondary | ICD-10-CM

## 2020-04-30 DIAGNOSIS — F3341 Major depressive disorder, recurrent, in partial remission: Secondary | ICD-10-CM

## 2020-04-30 DIAGNOSIS — Z1159 Encounter for screening for other viral diseases: Secondary | ICD-10-CM

## 2020-04-30 DIAGNOSIS — R5383 Other fatigue: Secondary | ICD-10-CM

## 2020-04-30 NOTE — Assessment & Plan Note (Signed)
Weight loss 9 lbs 3-4 month Improve encourage lifestyle, diet, limited exercise now

## 2020-04-30 NOTE — Assessment & Plan Note (Signed)
Improved control now of chronic OSA on CPAP - Good adherence to CPAP nightly now wear most of night, tolerating - Continue current CPAP therapy, patient seems to be benefiting from therapy

## 2020-04-30 NOTE — Assessment & Plan Note (Signed)
Significantly improved mood, depression now in partial remission Chronic anxiety Followed by [facility] in [place] On medicine Rarely on sleeping med

## 2020-04-30 NOTE — Assessment & Plan Note (Signed)
Previously improved Due lipid panel upcoming  Plan: 1. Continue current meds - Pravastatin 20mg  daily 2. Encourage improved lifestyle - low carb/cholesterol, reduce portion size, continue improving regular exercise

## 2020-04-30 NOTE — Patient Instructions (Addendum)
Thank you for coming to the office today.  Keep watching BP at home, contact us if elevated >140/90 consistently  Keep up good work  Stay tuned for lab results.  Please schedule a Follow-up Appointment to: Return in about 6 months (around 10/29/2020) for 6 month follow-up PreDM A1c, HTN, OSA, Fatigue.  If you have any other questions or concerns, please feel free to call the office or send a message through [place]. You may also schedule an earlier appointment if necessary.  Additionally, you may be receiving a survey about your experience at our office within a few days to 1 week by e-mail or mail. We value your feedback.  Nobie Putnam, DO [place]

## 2020-04-30 NOTE — Assessment & Plan Note (Signed)
Mild elevated BP. Improved on re-check Home readings limited On CPAP for OSA    Plan:  1. Continue current Amlodipine 10mg  daily, Edarbyclor 40-25mg  daily 2. Encourage improved lifestyle - low sodium diet, regular exercise 3. Continue improving monitor BP outside office, bring readings to next visit, if persistently >140/90 or new symptoms notify office sooner

## 2020-04-30 NOTE — Assessment & Plan Note (Signed)
Previously Stable, unchanged Mild elevated A1c 5.9, in range of PreDM   Due A1c  Plan:  1. Not on any therapy currently  2. Encourage improved lifestyle - low carb, low sugar diet, reduce portion size, continue improving regular exercise

## 2020-04-30 NOTE — Progress Notes (Signed)
Subjective:    Patient ID: Joseph Wilcox, male    DOB: Nov 06, 1970, 49 y.o.   MRN: [id number]  Joseph Wilcox is a 49 y.o. male presenting on 04/30/2020 for Annual Exam   HPI   Here for Annual Physical and fasting lab orders  Pre-Diabetes/ Obesity BMI >38 Last A1c 5.9, stable over past 1 year. Due for A1c now with labs Fam history of DM Lifestyle Weight down 9 lbs in past 3-4 months - Diet: Balanced diet,limit starches, reduce portions - Exercise: Gradually improving  CHRONIC HTN: Improved control. Limited home readings lately had been normal. Had one higher reading reported at other doctor Current Meds -Edarbyclor 40-25mg  daily, Amlodipine 10mg  Reports good compliance, took meds today. Tolerating well, w/o complaints. Denies CP, dyspnea, HA, edema  OSA, on CPAP - Patient reports prior history of dx OSA and on CPAP - Today reports that sleep apnea is well controlled.Heuses the CPAP machine every night. Tolerates the machine well, and thinks that sleeps much better with it and feels good. No new concerns or symptoms.  Additional updates  Hallux Limitus, Right Followed by Dr Amalia Hailey Podiatry, doing better, receiving steroid injections. Improved function.  Partial Thickness Tear Rotator Cuff (Left) Additional history - Low Back Pain, Sciatica Followed by Ortho with L shoulder injections with improvement No surgery at this time. He is trying to avoid. 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 Still on meds Abilify, Duloxetine - Rarely taking Temazepam 15mg  nightly PRN. Now sleeping better on  CPAP.  Additionally - Decreased Libido, fatigue - interested in checking for Low T.  Health Maintenance: Due for Flu Shot, will receive today   Colon CA Screening: Last Colonoscopy9/10/2018(done by Dr Vicente Males AGI), results with1 hyperplastic polyp, good for10years - next due in 2030. Currently asymptomatic. No known family history of colon CA.  Prostate CA Screening: No prior prostate CA screening. Currently asymptomatic. No known family history of prostate CA. Due for screening.  Due Hep C screen    Depression screen [facility] 2/9 04/30/2020 01/29/2020 05/02/2019  Decreased Interest 1 1 1   Down, Depressed, Hopeless 1 1 0  PHQ - 2 Score 2 2 1   Altered sleeping 1 0 0  Tired, decreased energy 1 1 1   Change in appetite 1 1 0  Feeling bad or failure about yourself  1 0 0  Trouble concentrating 1 0 0  Moving slowly or fidgety/restless 1 0 0  Suicidal thoughts 0 0 0  PHQ-9 Score 8 4 2   Difficult doing work/chores Not difficult at all Not difficult at all Not difficult at all  Some recent data might be hidden   GAD 7 : Generalized Anxiety Score 04/30/2020 12/01/2018 04/06/2018 10/24/2017  Nervous, Anxious, on Edge 1 0 0 0  Control/stop worrying 1 0 0 0  Worry too much - different things 1 0 0 0  Trouble relaxing 1 0 0 0  Restless 0 0 0 0  Easily annoyed or irritable 1 0 0 0  Afraid - awful might happen 0 0 0 0  Total GAD 7 Score 5 0 0 0  Anxiety Difficulty Not difficult at all  Not difficult at all Not difficult at all Not difficult at all      Past Medical History:  Diagnosis Date  . Allergy   . Anxiety   . Depression   . GERD (gastroesophageal reflux disease)   . High cholesterol   . Hx of migraines    Past Surgical History:  Procedure Laterality Date  . COLONOSCOPY WITH PROPOFOL N/A 03/09/2019   Procedure: COLONOSCOPY WITH PROPOFOL;  Surgeon: [name], MD;  Location: [facility] ENDOSCOPY;  Service: Gastroenterology;  Laterality: N/A;  . ESOPHAGOGASTRODUODENOSCOPY (EGD) WITH  PROPOFOL N/A 03/09/2019   Procedure: ESOPHAGOGASTRODUODENOSCOPY (EGD) WITH PROPOFOL;  Surgeon: [name], MD;  Location: [facility] ENDOSCOPY;  Service: Gastroenterology;  Laterality: N/A;  . FOOT SURGERY  2002   Bone spur  . TONSILLECTOMY     Social History   Socioeconomic History  . Marital status: Unknown    Spouse name: Not on file  . Number of children: Not on file  . Years of education: Not on file  . Highest education level: Not on file  Occupational History  . Not on file  Tobacco Use  . Smoking status: Former Smoker    Quit date: 07/05/2010    Years since quitting: 9.8  . Smokeless tobacco: Former Network engineer  . Vaping Use: Never used  Substance and Sexual Activity  . Alcohol use: Yes    Alcohol/week: 0.0 standard drinks    Comment: Occasional   . Drug use: No  . Sexual activity: Yes    Partners: Female    Birth control/protection: None  Other Topics Concern  . Not on file  Social History Narrative  . Not on file   Social Determinants of Health   Financial Resource Strain:   . Difficulty of Paying Living Expenses: Not on file  Food Insecurity:   . Worried About Charity fundraiser in the Last Year: Not on file  . Ran Out of Food in the Last Year: Not on file  Transportation Needs:   . Lack of Transportation (Medical): Not on file  . Lack of Transportation (Non-Medical): Not on file  Physical Activity:   . Days of Exercise per Week: Not on file  . Minutes of Exercise per Session: Not on file  Stress:   . Feeling of Stress : Not on file  Social Connections:   . Frequency of Communication with Friends and Family: Not on file  . Frequency of Social Gatherings with Friends and Family: Not on file  . Attends Religious Services: Not on file  . Active Member of Clubs or Organizations: Not on file  . Attends Archivist Meetings: Not on file  . Marital Status: Not on file  Intimate Partner Violence:   . Fear of Current or Ex-Partner: Not on file  .  Emotionally Abused: Not on file  . Physically Abused: Not on file  . Sexually Abused: Not on file   Family History  Problem Relation Age of Onset  . Diabetes Mother   . Prostate cancer Neg Hx   . Colon cancer Neg Hx    Current Outpatient Medications on File Prior to Visit  Medication Sig  . amLODipine (NORVASC) 10 MG tablet Take 1 tablet (10 mg total) by mouth daily.  . ARIPiprazole (ABILIFY) 2 MG tablet Take 1 tablet (2 mg total) by mouth daily.  Marland Kitchen EDARBYCLOR 40-25 MG TABS Take 1 tablet by mouth daily.  . pravastatin (PRAVACHOL) 20 MG tablet Take 1 tablet (20 mg  total) by mouth daily.  . temazepam (RESTORIL) 15 MG capsule Take 1 capsule (15 mg total) by mouth at bedtime as needed for sleep.  . DULoxetine (CYMBALTA) 60 MG capsule Take 1 capsule (60 mg total) by mouth daily.   No current facility-administered medications on file prior to visit.    Review of Systems  Constitutional: Positive for fatigue. Negative for activity change, appetite change, chills, diaphoresis and fever.  HENT: Negative for congestion and hearing loss.   Eyes: Negative for visual disturbance.  Respiratory: Negative for cough, chest tightness, shortness of breath and wheezing.   Cardiovascular: Negative for chest pain, palpitations and leg swelling.  Gastrointestinal: Negative for abdominal pain, anal bleeding, blood in stool, constipation, diarrhea, nausea and vomiting.  Endocrine: Negative for cold intolerance.  Genitourinary: Negative for decreased urine volume, difficulty urinating, dysuria, frequency, hematuria and urgency.  Musculoskeletal: Negative for arthralgias and neck pain.  Skin: Negative for rash.  Allergic/Immunologic: Negative for environmental allergies.  Neurological: Negative for dizziness, weakness, light-headedness, numbness and headaches.  Hematological: Negative for adenopathy.  Psychiatric/Behavioral: Negative for behavioral problems, dysphoric mood and sleep disturbance. The patient  is not nervous/anxious.    Per HPI unless specifically indicated above      Objective:    BP 136/80 (BP Location: Left Arm, Cuff Size: Normal)   Pulse 76   Temp (!) 96.8 F (36 C) (Temporal)   Resp 16   Ht 6\' 1"  (1.854 m)   Wt 293 lb (132.9 kg)   SpO2 97%   BMI 38.66 kg/m   Wt Readings from Last 3 Encounters:  04/30/20 293 lb (132.9 kg)  01/29/20 (!) 302 lb (137 kg)  05/02/19 291 lb (132 kg)    Physical Exam Vitals and nursing note reviewed.  Constitutional:      General: He is not in acute distress.    Appearance: He is well-developed. He is obese. He is not diaphoretic.     Comments: Well-appearing, comfortable, cooperative  HENT:     Head: Normocephalic and atraumatic.  Eyes:     General:        Right eye: No discharge.        Left eye: No discharge.     Conjunctiva/sclera: Conjunctivae normal.     Pupils: Pupils are equal, round, and reactive to light.  Neck:     Thyroid: No thyromegaly.     Vascular: No carotid bruit.  Cardiovascular:     Rate and Rhythm: Normal rate and regular rhythm.     Heart sounds: Normal heart sounds. No murmur heard.   Pulmonary:     Effort: Pulmonary effort is normal. No respiratory distress.     Breath sounds: Normal breath sounds. No wheezing or rales.  Abdominal:     General: Bowel sounds are normal. There is no distension.     Palpations: Abdomen is soft. There is no mass.     Tenderness: There is no abdominal tenderness.  Musculoskeletal:        General: No tenderness. Normal range of motion.     Cervical back: Normal range of motion and neck supple.     Right lower leg: No edema.     Left lower leg: No edema.     Comments: Upper / Lower Extremities: - Normal muscle tone, strength bilateral upper extremities 5/5, lower extremities 5/5  Lymphadenopathy:     Cervical: No cervical adenopathy.  Skin:    General: Skin is warm and dry.     Findings: No  erythema or rash.  Neurological:     Mental Status: He is alert and  oriented to person, place, and time.     Comments: Distal sensation intact to light touch all extremities  Psychiatric:        Behavior: Behavior normal.     Comments: Well groomed, good eye contact, normal speech and thoughts        Results for orders placed or performed in visit on 04/25/19  Lipid panel  Result Value Ref Range   Cholesterol 234 (H) <200 mg/dL   HDL 57 > OR = 40 mg/dL   Triglycerides 144 <150 mg/dL   LDL Cholesterol (Calc) 150 (H) mg/dL (calc)   Total CHOL/HDL Ratio 4.1 <5.0 (calc)   Non-HDL Cholesterol (Calc) 177 (H) <130 mg/dL (calc)  COMPLETE METABOLIC PANEL WITH GFR  Result Value Ref Range   Glucose, Bld 86 65 - 99 mg/dL   BUN 11 7 - 25 mg/dL   Creat 1.30 0.60 - 1.35 mg/dL   GFR, Est Non African American 66 > OR = 60 mL/min/1.40m2   GFR, Est African American 76 > OR = 60 mL/min/1.40m2   BUN/Creatinine Ratio NOT APPLICABLE 6 - 22 (calc)   Sodium 139 135 - 146 mmol/L   Potassium 3.5 3.5 - 5.3 mmol/L   Chloride 97 (L) 98 - 110 mmol/L   CO2 32 20 - 32 mmol/L   Calcium 9.4 8.6 - 10.3 mg/dL   Total Protein 6.8 6.1 - 8.1 g/dL   Albumin 4.3 3.6 - 5.1 g/dL   Globulin 2.5 1.9 - 3.7 g/dL (calc)   AG Ratio 1.7 1.0 - 2.5 (calc)   Total Bilirubin 0.8 0.2 - 1.2 mg/dL   Alkaline phosphatase (APISO) 65 36 - 130 U/L   AST 17 10 - 40 U/L   ALT 24 9 - 46 U/L  CBC with Differential/Platelet  Result Value Ref Range   WBC 10.2 3.8 - 10.8 Thousand/uL   RBC 5.57 4.20 - 5.80 Million/uL   Hemoglobin 12.0 (L) 13.2 - 17.1 g/dL   HCT 38.1 (L) 38 - 50 %   MCV 68.4 (L) 80.0 - 100.0 fL   MCH 21.5 (L) 27.0 - 33.0 pg   MCHC 31.5 (L) 32.0 - 36.0 g/dL   RDW 16.3 (H) 11.0 - 15.0 %   Platelets 342 140 - 400 Thousand/uL   MPV 9.2 7.5 - 12.5 fL   Neutro Abs 6,967 1,500 - 7,800 cells/uL   Lymphs Abs 2,611 850 - 3,900 cells/uL   Absolute Monocytes 541 200 - 950 cells/uL   Eosinophils Absolute 51 15.0 - 500.0 cells/uL   Basophils Absolute 31 0.0 - 200.0 cells/uL   Neutrophils  Relative % 68.3 %   Total Lymphocyte 25.6 %   Monocytes Relative 5.3 %   Eosinophils Relative 0.5 %   Basophils Relative 0.3 %   Smear Review    Hemoglobin A1c  Result Value Ref Range   Hgb A1c MFr Bld 5.9 (H) <5.7 % of total Hgb   Mean Plasma Glucose 123 (calc)   eAG (mmol/L) 6.8 (calc)      Assessment & Plan:   Problem List Items Addressed This Visit    OSA on CPAP    Improved control now of chronic OSA on CPAP - Good adherence to CPAP nightly now wear most of night, tolerating - Continue current CPAP therapy, patient seems to be benefiting from therapy      Relevant Orders   CBC with Differential/Platelet  COMPLETE METABOLIC PANEL WITH GFR   Obesity (BMI 35.0-39.9 without comorbidity)    Weight loss 9 lbs 3-4 month Improve encourage lifestyle, diet, limited exercise now      Mixed hyperlipidemia    Previously improved Due lipid panel upcoming  Plan: 1. Continue current meds - Pravastatin 20mg  daily 2. Encourage improved lifestyle - low carb/cholesterol, reduce portion size, continue improving regular exercise      Relevant Orders   COMPLETE METABOLIC PANEL WITH GFR   Lipid panel   TSH   Major depressive disorder, recurrent, in partial remission (HCC)    Significantly improved mood, depression now in partial remission Chronic anxiety [facility]ed [facility] in [place] On medicine Rarely on sleeping med      Essential hypertension    Mild elevated BP. Improved on re-check Home readings limited On CPAP for OSA    Plan:  1. Continue current Amlodipine 10mg  daily, Edarbyclor 40-25mg  daily 2. Encourage improved lifestyle - low sodium diet, regular exercise 3. Continue improving monitor BP outside office, bring readings to next visit, if persistently >140/90 or new symptoms notify office sooner      Relevant Orders   CBC with Differential/Platelet   COMPLETE METABOLIC PANEL WITH GFR   Elevated hemoglobin A1c    Previously Stable,  unchanged Mild elevated A1c 5.9, in range of PreDM   Due A1c  Plan:  1. Not on any therapy currently  2. Encourage improved lifestyle - low carb, low sugar diet, reduce portion size, continue improving regular exercise      Relevant Orders   Hemoglobin A1c    Other Visit Diagnoses    Annual physical exam    -  Primary   Relevant Orders   Hemoglobin A1c   CBC with Differential/Platelet   COMPLETE METABOLIC PANEL WITH GFR   Lipid panel   PSA   Needs flu shot       Relevant Orders   Flu Vaccine QUAD 36+ mos IM (Completed)   Decreased libido       Relevant Orders   Testosterone   Fatigue, unspecified type       Relevant Orders   Testosterone   Need for hepatitis C screening test       Relevant Orders   Hepatitis C antibody      Updated Health Maintenance information - Flu shot today Due for fasting lab today. Will place orders and draw today - added other labs Testosterone, TSH, PSA Hep C today Encouraged improvement to lifestyle with diet and exercise - Goal of weight loss  Fatigue/Decreased libido No prior Low T lab Will check testosterone by request today, f/u result   No orders of the defined types were placed in this encounter.   Follow up plan: Return in about 6 months (around 10/29/2020) for 6 month follow-up PreDM A1c, HTN, OSA, Fatigue.  Nobie Putnam, DO [facility] 04/30/2020, 8:10 AM

## 2020-05-01 ENCOUNTER — Encounter: Payer: Self-pay | Admitting: Family Medicine

## 2020-05-01 DIAGNOSIS — R7989 Other specified abnormal findings of blood chemistry: Secondary | ICD-10-CM | POA: Insufficient documentation

## 2020-05-01 LAB — CBC WITH DIFFERENTIAL/PLATELET
Absolute Monocytes: 595 cells/uL (ref 200–950)
Basophils Absolute: 28 cells/uL (ref 0–200)
Basophils Relative: 0.3 %
Eosinophils Absolute: 121 cells/uL (ref 15–500)
Eosinophils Relative: 1.3 %
HCT: 39.8 % (ref 38.5–50.0)
Hemoglobin: 13 g/dL — ABNORMAL LOW (ref 13.2–17.1)
Lymphs Abs: 2353 cells/uL (ref 850–3900)
MCH: 22.7 pg — ABNORMAL LOW (ref 27.0–33.0)
MCHC: 32.7 g/dL (ref 32.0–36.0)
MCV: 69.6 fL — ABNORMAL LOW (ref 80.0–100.0)
MPV: 9.4 fL (ref 7.5–12.5)
Monocytes Relative: 6.4 %
Neutro Abs: 6203 cells/uL (ref 1500–7800)
Neutrophils Relative %: 66.7 %
Platelets: 352 10*3/uL (ref 140–400)
RBC: 5.72 10*6/uL (ref 4.20–5.80)
RDW: 17.3 % — ABNORMAL HIGH (ref 11.0–15.0)
Total Lymphocyte: 25.3 %
WBC: 9.3 10*3/uL (ref 3.8–10.8)

## 2020-05-01 LAB — COMPLETE METABOLIC PANEL WITH GFR
AG Ratio: 1.5 (calc) (ref 1.0–2.5)
ALT: 17 U/L (ref 9–46)
AST: 17 U/L (ref 10–40)
Albumin: 4.2 g/dL (ref 3.6–5.1)
Alkaline phosphatase (APISO): 74 U/L (ref 36–130)
BUN: 10 mg/dL (ref 7–25)
CO2: 28 mmol/L (ref 20–32)
Calcium: 9.5 mg/dL (ref 8.6–10.3)
Chloride: 100 mmol/L (ref 98–110)
Creat: 1.32 mg/dL (ref 0.60–1.35)
GFR, Est African American: 74 mL/min/{1.73_m2} (ref >=60)
GFR, Est Non African American: 64 mL/min/{1.73_m2} (ref >=60)
Globulin: 2.8 g/dL (calc) (ref 1.9–3.7)
Glucose, Bld: 87 mg/dL (ref 65–99)
Potassium: 3.4 mmol/L — ABNORMAL LOW (ref 3.5–5.3)
Sodium: 139 mmol/L (ref 135–146)
Total Bilirubin: 0.7 mg/dL (ref 0.2–1.2)
Total Protein: 7 g/dL (ref 6.1–8.1)

## 2020-05-01 LAB — LIPID PANEL
Cholesterol: 249 mg/dL — ABNORMAL HIGH (ref <200)
HDL: 46 mg/dL (ref >=40)
LDL Cholesterol (Calc): 166 mg/dL (calc) — ABNORMAL HIGH
Non-HDL Cholesterol (Calc): 203 mg/dL (calc) — ABNORMAL HIGH (ref <130)
Total CHOL/HDL Ratio: 5.4 (calc) — ABNORMAL HIGH (ref <5.0)
Triglycerides: 206 mg/dL — ABNORMAL HIGH (ref <150)

## 2020-05-01 LAB — TSH: TSH: 1.53 mIU/L (ref 0.40–4.50)

## 2020-05-01 LAB — PSA: PSA: 0.23 ng/mL (ref <=4.0)

## 2020-05-01 LAB — HEMOGLOBIN A1C
Hgb A1c MFr Bld: 5.6 % of total Hgb (ref <5.7)
Mean Plasma Glucose: 114 (calc)
eAG (mmol/L): 6.3 (calc)

## 2020-05-01 LAB — TESTOSTERONE: Testosterone: 201 ng/dL — ABNORMAL LOW (ref 250–827)

## 2020-05-01 LAB — HEPATITIS C ANTIBODY
Hepatitis C Ab: NONREACTIVE
SIGNAL TO CUT-OFF: 0.02 (ref <1.00)

## 2020-06-19 ENCOUNTER — Ambulatory Visit: Admitting: Psychiatry

## 2020-06-20 ENCOUNTER — Ambulatory Visit (INDEPENDENT_AMBULATORY_CARE_PROVIDER_SITE_OTHER): Admitting: Psychiatry

## 2020-06-20 ENCOUNTER — Encounter: Payer: Self-pay | Admitting: Psychiatry

## 2020-06-20 ENCOUNTER — Other Ambulatory Visit: Payer: Self-pay

## 2020-06-20 DIAGNOSIS — F331 Major depressive disorder, recurrent, moderate: Secondary | ICD-10-CM | POA: Diagnosis not present

## 2020-06-20 DIAGNOSIS — F411 Generalized anxiety disorder: Secondary | ICD-10-CM | POA: Diagnosis not present

## 2020-06-20 DIAGNOSIS — G4700 Insomnia, unspecified: Secondary | ICD-10-CM | POA: Diagnosis not present

## 2020-06-20 MED ORDER — TEMAZEPAM 15 MG PO CAPS
15.0000 mg | ORAL_CAPSULE | Freq: Every evening | ORAL | 2 refills | Status: DC | PRN
Start: 2020-06-20 — End: 2021-06-22

## 2020-06-20 MED ORDER — ARIPIPRAZOLE 2 MG PO TABS: 2.0000 mg | ORAL_TABLET | Freq: Every day | ORAL | 0 refills | Status: DC

## 2020-06-20 MED ORDER — DULOXETINE HCL 30 MG PO CPEP: ORAL_CAPSULE | ORAL | 0 refills | Status: DC

## 2020-06-20 MED ORDER — SERTRALINE HCL 50 MG PO TABS: ORAL_TABLET | ORAL | 0 refills | Status: DC

## 2020-06-20 MED ORDER — SERTRALINE HCL 100 MG PO TABS: 150.0000 mg | ORAL_TABLET | Freq: Every day | ORAL | 1 refills | Status: DC

## 2020-06-20 NOTE — Progress Notes (Signed)
Joseph Wilcox [id number] May 14, 1971 49 y.o.  Subjective:   Patient ID:  Joseph Wilcox is a 49 y.o. (DOB 27-Jul-1970) male.  Chief Complaint:  Chief Complaint  Patient presents with  . Anxiety  . Depression    HPI Joseph Wilcox presents to the office today for follow-up of depression, anxiety, and irritability. He reports persistent sad mood- "nowhere as bad as it used to be." He reports that his wife has said he is irritable. He notices some irritable days. He reports some mood lability- "it can change by the hour." He reports that he has occasional elevated moods. He reports that he will put off making calls and has anxiety dealing with people and doing something new. Notices some worry and anxious thoughts. Some worry about change in benefits, finances, etc. He reports some catastrophic thoughts and rumination. He reports that he thinks about situation in [employer] every morning. He reports intrusive memories and re-experiencing related to events around leaving [employer]. He reports some vivid dreams- "nothing pertaining to real life." Denies any physical s/s with anxiety or panic attacks. Slight startle response. He reports that he is "always sleepy." He reports that he sleeps through the night. Sleeping at least 8 hours. Appetite has been ok. Reports that he rarely craves any foods. He reports that his concentration has been poor and reports that he has been easily distracted at work. Motivation has been low. Denies anhedonia. He reports some vague intrusive thoughts of suicide. Denies suicidal plan or intent.   Energy has been very low. Denies any past episodes of excessive energy. He reports that he has had some impulsivity, such as becoming agitated when someone at work "got up in my face." He reports there was another incident where he was at the airport and got into a confrontation with someone else waiting in line.  Denies excessive spending. Denies any periods of decreased need for  sleep.   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 They moved in March.   He reports that he has not had a lapse in medication.   Past Psychiatric Medication Trials: Duloxetine- Was taking 60 mg. Helpful for both anxiety and depression. Denies any long-term side effects. Initially had a "tickling, fluttering" sensation in chest.  Abilify- Was taking 2 mg po qd.  Gabapentin- Being prescribed for sciatic nerve pain. Has not noticed any effect on anxiety Ritalin- Took in childhood for ADD.  Temazepam- Effective Trazodone- Nightmares  AIMS   Flowsheet Row Office Visit from 06/15/2019 in [place] Office Visit from 05/04/2019 in [place] Total Score 0 0    GAD-7   [place] Office Visit from 06/20/2020 in [place] Office Visit from 04/30/2020 in [facility] Office Visit from 12/01/2018 in [facility] Office Visit from 04/06/2018 in [facility] Office Visit from 10/24/2017 in [facility]  Total GAD-7 Score 10 5 0 0 0    PHQ2-9   [place] Office Visit from 06/20/2020 in [place] Visit from 04/30/2020 in [facility] Office Visit from 01/29/2020 in [facility] Office Visit from 05/02/2019 in [facility] Office Visit from 03/30/2019 in [place]  PHQ-2 Total Score 4 2 2 1 3   PHQ-9 Total Score 19 8 4 2  14  Review of Systems:  Review of Systems  Gastrointestinal: Negative.   Musculoskeletal: Negative for gait problem.       Shoulder and knee pain  Neurological:       He reports occasional finger twitch  Psychiatric/Behavioral:       Please refer to HPI    Medications: I have reviewed the patient's current medications.  Current Outpatient Medications  Medication Sig Dispense  Refill  . amLODipine (NORVASC) 10 MG tablet Take 1 tablet (10 mg total) by mouth daily. 90 tablet 3  . EDARBYCLOR 40-25 MG TABS Take 1 tablet by mouth daily. 90 tablet 3  . pravastatin (PRAVACHOL) 20 MG tablet Take 1 tablet (20 mg total) by mouth daily. 90 tablet 3  . ARIPiprazole (ABILIFY) 2 MG tablet Take 1 tablet (2 mg total) by mouth daily. 90 tablet 0  . DULoxetine (CYMBALTA) 30 MG capsule Take 1 capsule po qd for 10-15 days, then stop 15 capsule 0  . sertraline (ZOLOFT) 100 MG tablet Take 1.5 tablets (150 mg total) by mouth daily. 45 tablet 1  . sertraline (ZOLOFT) 50 MG tablet Take 1.5 tablets (75 mg total) by mouth daily for 7 days, THEN 3 tablets (150 mg total) daily for 21 days. 60 tablet 0  . temazepam (RESTORIL) 15 MG capsule Take 1 capsule (15 mg total) by mouth at bedtime as needed for sleep. 30 capsule 2   No current facility-administered medications for this visit.    Medication Side Effects: Other: Loss of libido  Allergies: No Known Allergies  Past Medical History:  Diagnosis Date  . Allergy   . Anxiety   . Depression   . GERD (gastroesophageal reflux disease)   . High cholesterol   . Hx of migraines     Family History  Problem Relation Age of Onset  . Diabetes Mother   . Prostate cancer Neg Hx   . Colon cancer Neg Hx     Social History   Socioeconomic History  . Marital status: Unknown    Spouse name: Not on file  . Number of children: Not on file  . Years of education: Not on file  . Highest education level: Not on file  Occupational History  . Not on file  Tobacco Use  . Smoking status: Former Smoker    Quit date: 07/05/2010    Years since quitting: 9.9  . Smokeless tobacco: Former Network engineer  . Vaping Use: Never used  Substance and Sexual Activity  . Alcohol use: Yes    Alcohol/week: 0.0 standard drinks    Comment: Occasional   . Drug use: No  . Sexual activity: Yes    Partners: Female    Birth control/protection: None  Other Topics  Concern  . Not on file  Social History Narrative  . Not on file   Social Determinants of Health   Financial Resource Strain: Not on file  Food Insecurity: Not on file  Transportation Needs: Not on file  Physical Activity: Not on file  Stress: Not on file  Social Connections: Not on file  Intimate Partner Violence: Not on file    Past Medical History, Surgical history, Social history, and Family history were reviewed and updated as appropriate.   Please see review of systems for further details on the patient's review from today.   Objective:   Physical Exam:  There were no vitals taken for this visit.  Physical Exam Constitutional:      General: He is not in  acute distress. Musculoskeletal:        General: No deformity.  Neurological:     Mental Status: He is alert and oriented to person, place, and time.     Coordination: Coordination normal.  Psychiatric:        Attention and Perception: Attention and perception normal. He does not perceive auditory or visual hallucinations.        Mood and Affect: Mood is anxious and depressed. Affect is not labile, blunt, angry or inappropriate.        Speech: Speech normal.        Behavior: Behavior normal.        Thought Content: Thought content normal. Thought content is not paranoid or delusional. Thought content does not include homicidal or suicidal ideation. Thought content does not include homicidal or suicidal plan.        Cognition and Memory: Cognition and memory normal.        Judgment: Judgment normal.     Comments: Insight intact     Lab Review:     Component Value Date/Time   NA 139 04/30/2020 0835   K 3.4 (L) 04/30/2020 0835   CL 100 04/30/2020 0835   CO2 28 04/30/2020 0835   GLUCOSE 87 04/30/2020 0835   BUN 10 04/30/2020 0835   CREATININE 1.32 04/30/2020 0835   CALCIUM 9.5 04/30/2020 0835   PROT 7.0 04/30/2020 0835   ALBUMIN 4.3 02/21/2017 1600   AST 17 04/30/2020 0835   ALT 17 04/30/2020 0835   ALKPHOS  59 02/21/2017 1600   BILITOT 0.7 04/30/2020 0835   GFRNONAA 64 04/30/2020 0835   GFRAA 74 04/30/2020 0835       Component Value Date/Time   WBC 9.3 04/30/2020 0835   RBC 5.72 04/30/2020 0835   HGB 13.0 (L) 04/30/2020 0835   HGB 12.6 (L) 02/09/2019 0808   HCT 39.8 04/30/2020 0835   HCT 39.0 02/09/2019 0808   PLT 352 04/30/2020 0835   PLT 386 02/09/2019 0808   MCV 69.6 (L) 04/30/2020 0835   MCV 69 (L) 02/09/2019 0808   MCH 22.7 (L) 04/30/2020 0835   MCHC 32.7 04/30/2020 0835   RDW 17.3 (H) 04/30/2020 0835   RDW 17.5 (H) 02/09/2019 0808   LYMPHSABS 2,353 04/30/2020 0835   LYMPHSABS 3.1 02/09/2019 0808   EOSABS 121 04/30/2020 0835   EOSABS 0.1 02/09/2019 0808   BASOSABS 28 04/30/2020 0835   BASOSABS 0.0 02/09/2019 0808    No results found for: POCLITH, LITHIUM   No results found for: PHENYTOIN, PHENOBARB, VALPROATE, CBMZ   .res Assessment: Plan:    Pt seen for 30 minutes and time spent counseling pt regarding treatment options. Recommended change in medication since he is experiencing worsening mood and anxiety s/s. Discussed trial of an SSRI to improve anxiety s/s. Discussed potential benefits, risks, and side effects of Sertraline. Discussed that Sertraline is indicated for social anxiety and depression. Discussed that Sertraline is also typically effective for irritability. Pt agrees to trial of Sertraline. Discussed cross-titration from Cymbalta to Sertraline to minimize risk of discontinuation s/s. Will gradually decrease Cymbalta and increase Sertraline to target dose of 150 mg po qd for depression and anxiety.  Continue Abilify 2 mg po qd for augmentation of depression.  Continue Temazepam 15 mg po QHS prn insomnia.  Recommend resuming therapy with Rinaldo Cloud, LCSW.  Pt to follow-up with this provider in 1-2 months or sooner if clinically indicated.  Patient advised to contact office with any questions, adverse  effects, or acute worsening in signs and  symptoms.    Joseph Wilcox was seen today for anxiety and depression.  Diagnoses and all orders for this visit:  Generalized anxiety disorder -     DULoxetine (CYMBALTA) 30 MG capsule; Take 1 capsule po qd for 10-15 days, then stop -     sertraline (ZOLOFT) 50 MG tablet; Take 1.5 tablets (75 mg total) by mouth daily for 7 days, THEN 3 tablets (150 mg total) daily for 21 days. -     sertraline (ZOLOFT) 100 MG tablet; Take 1.5 tablets (150 mg total) by mouth daily.  MDD (major depressive disorder), recurrent episode, moderate (HCC) -     DULoxetine (CYMBALTA) 30 MG capsule; Take 1 capsule po qd for 10-15 days, then stop -     sertraline (ZOLOFT) 50 MG tablet; Take 1.5 tablets (75 mg total) by mouth daily for 7 days, THEN 3 tablets (150 mg total) daily for 21 days. -     ARIPiprazole (ABILIFY) 2 MG tablet; Take 1 tablet (2 mg total) by mouth daily. -     sertraline (ZOLOFT) 100 MG tablet; Take 1.5 tablets (150 mg total) by mouth daily.  Insomnia, unspecified type -     temazepam (RESTORIL) 15 MG capsule; Take 1 capsule (15 mg total) by mouth at bedtime as needed for sleep.     Please see After Visit Summary for patient specific instructions.  Future Appointments  Date Time Provider [place]  07/28/2020  4:00 PM Shanon Ace, [place] [facility] None  08/15/2020  1:00 PM Thayer Headings, PMHNP CP-CP None  10/29/2020  3:40 PM Parks Ranger, Devonne Doughty, DO [facility]    No orders of the defined types were placed in this encounter.   -------------------------------

## 2020-06-20 NOTE — Patient Instructions (Addendum)
-  Start Sertraline (Zoloft) 75 mg (1.5 of the 50 mg tablets) for 7 days, then increase to 150 mg (three of the 50 mg tablets. Take with food initially to help prevent upset stomach. Another prescription is on file at your pharmacy for 1.5 of the 100 mg tablets that you can have filled when the 50 mg tablets run out.   -Start Cymbalta 30 mg capsules with the Sertraline. Stop Cymbalta after 10 days unless you are experiencing some discontinuation symptoms.  -Continue Aripiprazole  -Call office with any severe side effects or significant discontinuation symptoms.

## 2020-07-22 ENCOUNTER — Telehealth: Payer: Self-pay | Admitting: Psychiatry

## 2020-07-22 DIAGNOSIS — F411 Generalized anxiety disorder: Secondary | ICD-10-CM

## 2020-07-22 DIAGNOSIS — F331 Major depressive disorder, recurrent, moderate: Secondary | ICD-10-CM

## 2020-07-22 MED ORDER — SERTRALINE HCL 100 MG PO TABS: 150.0000 mg | ORAL_TABLET | Freq: Every day | ORAL | 1 refills | Status: DC

## 2020-07-22 NOTE — Telephone Encounter (Signed)
Rec'd fax refill request from pt's pharmacy for Sertraline. Refill sent.

## 2020-07-28 ENCOUNTER — Telehealth: Payer: Self-pay | Admitting: Psychiatry

## 2020-07-28 ENCOUNTER — Other Ambulatory Visit: Payer: Self-pay

## 2020-07-28 ENCOUNTER — Ambulatory Visit (INDEPENDENT_AMBULATORY_CARE_PROVIDER_SITE_OTHER): Admitting: Psychiatry

## 2020-07-28 DIAGNOSIS — F411 Generalized anxiety disorder: Secondary | ICD-10-CM | POA: Diagnosis not present

## 2020-07-28 NOTE — Telephone Encounter (Signed)
Rec'd faxed request for refill of Cymbalta. Pt is no longer on Cymbalta since it was changed to Sertraline.

## 2020-07-28 NOTE — Progress Notes (Signed)
[facility] Counselor/Therapist Progress Note  Patient ID: KAIUS DAINO, MRN: [id number],    Date: 07/28/2020  Time Spent: 50 minutes   4:00pm to 4:50pm   Treatment Type: Individual Therapy  Reported Symptoms: anxiety "strongest symptom", depression "has decreased"  Mental Status Exam:  Appearance:   Casual     Behavior:  Appropriate and Sharing  Motor:  Normal  Speech/Language:   Clear and Coherent  Affect:  anxious, some depression  Mood:  anxious and some depression  Thought process:  goal directed  Thought content:    WNL  Sensory/Perceptual disturbances:    WNL  Orientation:  oriented to person, place, time/date, situation, day of week, month of year and year  Attention:  Good  Concentration:  Good and Fair  Memory:  occasional forgetfulness  Fund of knowledge:   Good  Insight:    Good and Fair  Judgment:   Good and Fair  Impulse Control:  Fair   Risk Assessment: Danger to Self:  No Self-injurious Behavior: No Danger to Others: No Duty to Warn:no Physical Aggression / Violence:No  Access to Firearms a concern: No  Gang Involvement:No   Subjective: Patient today reporting anxiety as strongest symptom, along with some depression.  Switch in meds seems to have helped his depression more recently, per patient report.  Interventions: Solution-Oriented/Positive Psychology and Ego-Supportive  Diagnosis:   ICD-10-CM   1. Generalized anxiety disorder  F41.1      Plan: Patient not signing tx plan on computer screen due to [place].  Treatment Goals: Goals remain the same and progress in being updated beloweach session.We reviewed goals today and talked about progress noted below.  Long Term Goal: Develop healthy interpersonal relationships that lead to alleviation and help prevent relapse of depression.  Strategy: Patient will intentionally seek to have more interpersonal relationships that are healthy and support the alleviation of  depression.  Progressing- (Patient focusing on short term goal today.) See below.  Short Term Goal: Identify and replace anxious/depressive thinking that leads to feeling more anxious/depressed and leads to anxious/depressive actions.  Strategy: Patient will replace negative and self-defeating self-talk with more realistic and positive messages.  Progressing: Patient in today after not being seen in therapy in several months.  We tweaked his treatment plan some to be more currently focused for patient.  Reporting anxiety as main symptom, along with depression. 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 States he and wife decided he needed to get back into therapy after some stressors on his job and an confrontational incident at airport recently.  I know I need to learn to "keep my cool" and not over-react. Several times at work "I got aggravated and short tempered and not use any diplomacy." Processed several stressful/volatile situations and his thought processes under stress.  Also discussed some conflict management strategies for patient to be able to use personally or professionally on his job.  He admits that he can be quick to react and we talked today about being more mindful his interactions, especially when there is stress or potential conflict.  Actually demonstrated some of this for patient during session for him to better understand how a person might react to him differently based on how he is behaving towards them.  Also discussed strategies for not reacting so quickly in the moment, and being able to make a quick assessment in the situation or interaction, and hopefully being able to respond in a  calmer more settled manner.  He has improved some at home whenever there might be a disagreement or some tension expressed, and states that he does need to improve on the job as well.  Other aspects of his job, he reports are going quite well well with  faculty and students. Encouraged patient to reflect on our discussion today and especially as he interacts with people at home and on the job, taking the time to pause as needed rather than always responding in the moment, often based on his feelings rather than thinking things through.  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  Seems to have a positive attitude today and wants to progress in these areas.  Goal review and progress/challenges noted with patient.  Next appointment in 2 weeks.   Shanon Ace, LCSW

## 2020-08-13 ENCOUNTER — Ambulatory Visit (INDEPENDENT_AMBULATORY_CARE_PROVIDER_SITE_OTHER): Admitting: Psychiatry

## 2020-08-13 ENCOUNTER — Other Ambulatory Visit: Payer: Self-pay

## 2020-08-13 DIAGNOSIS — F411 Generalized anxiety disorder: Secondary | ICD-10-CM

## 2020-08-13 NOTE — Progress Notes (Signed)
[facility] Counselor/Therapist Progress Note  Patient ID: MYRICK MCNAIRY, MRN: [id number],    Date: 08/13/2020  Time Spent: 50 minutes    4:00pm to 4:50pm  Treatment Type: Individual Therapy  Reported Symptoms: Anxiety, sometimes easily agitated outside of home or work  Mental Status Exam:  Appearance:   Casual     Behavior:  Appropriate, Sharing and Motivated  Motor:  Normal  Speech/Language:   Clear and Coherent  Affect:  anxious  Mood:  anxious  Thought process:  goal directed  Thought content:    WNL  Sensory/Perceptual disturbances:    WNL  Orientation:  oriented to person, place, time/date, situation, day of week, month of year and year  Attention:  Good  Concentration:  Good  Memory:  WNL  Fund of knowledge:   Good  Insight:    Good and Fair  Judgment:   Good  Impulse Control:  Good   Risk Assessment: Danger to Self:  No Self-injurious Behavior: No Danger to Others: No Duty to Warn:no Physical Aggression / Violence:No  Access to Firearms a concern: No  Gang Involvement:No   Subjective: Patient today reporting anxiety in reference to some tasks more so at home than work, and also wanting to break his habit of eating unhealthy and become healthier.  Interventions: Cognitive Behavioral Therapy and Solution-Oriented/Positive Psychology  Diagnosis:   ICD-10-CM   1. Generalized anxiety disorder  F41.1     Plan: Patient not signing tx plan on computer screen due to [place].  Treatment Goals: Goals remain the same and progress in being updated beloweach session.We reviewed goals today and talked about progress noted below.  Long Term Goal: Develop healthy interpersonal relationships that lead to alleviation and help prevent relapse of depression.  Strategy: Patient will intentionally seek to have more interpersonal relationships that are healthy and support the alleviation of depression.  Progressing- (Patient focusing on short term  goal today.) See below.  Short Term Goal: Identify and replace anxious/depressive thinking that leads to feeling more anxious/depressed and leads to anxious/depressive actions.  Strategy: Patient will replace negative and self-defeating self-talk with more realistic and positive messages.  Progressing: Patient in today reporting anxiety is strongest symptom currently. Describes that he is often easily agitated but situations outside of work and home," such as being aggravated in a store or someone doing something crazy while driving", etc. Negative self-talk has decreased. Does seem to be adjusting well on his job. Needing and wanting to stop making unhealthy choices in eating. Discussed ways of motivating himself and making healthy choices, with his wife also making healthy choices it will be easier for the 2 of them.  States that he needs to stop buying unhealthy items such as chocolate candy and ice cream that is high-calorie.  Talked about some things he could use instead such as low-fat yogurt and other items that he mentioned.  He acknowledged that the hardest thing for him is going to be maintaining some motivation.  Also discussed him increasing his level of exercise, and inviting his wife to join him in that as well. Anxiety is another area he is working on and has realized that procrastination is causing him to put things off, and often complicates matters for himself. Discussed several tasks that he typically puts off due to anxiety, with opening the mail and routine task being anxiety provoking for him.  Admits he feels there's no real reason for him to have anxiety over the mail and that  if happens whether it's bills or not.  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  Worked today on some  strategies and he especially identified with having more positive self talk and coaching himself through some of these tasks, using helpful verbal prompts discount the anxiety and accentuate his completion of various tasks and not be sidelined by anxiety.  He felt like this was a good way to go at this and plans to work on it between sessions.  Also plans to work on the healthier eating challenge and get his wife involved as a support system.  Does seem to like and be liked on his job which is really a good change for this patient.  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  Encouraged patient to reflect more on his positives than negatives, on the goals that he wants to achieve and healthier nutrition and increased exercise, on the goals that he wants to achieve and reducing his anxiety overall, to stay in the positive, to focus on what he can change or control versus cannot, to work on setting healthy limits and boundaries as needed with others, and to stay in contact with people that are supportive of him.  His positive attitude and motivation continue.  Goal review and progress/challenges noted with patient.  Next appointment within 2 to 3 weeks.   Shanon Ace, LCSW

## 2020-08-15 ENCOUNTER — Ambulatory Visit: Admitting: Psychiatry

## 2020-08-27 ENCOUNTER — Ambulatory Visit: Admitting: Psychiatry

## 2020-09-10 ENCOUNTER — Other Ambulatory Visit: Payer: Self-pay

## 2020-09-10 ENCOUNTER — Ambulatory Visit (INDEPENDENT_AMBULATORY_CARE_PROVIDER_SITE_OTHER): Admitting: Psychiatry

## 2020-09-10 DIAGNOSIS — F411 Generalized anxiety disorder: Secondary | ICD-10-CM

## 2020-09-10 NOTE — Progress Notes (Signed)
[facility] Counselor/Therapist Progress Note  Patient ID: Joseph Wilcox, MRN: [id number],    Date: 09/10/2020  Time Spent: 60 minutes 4:00pm to 5:00pm  Treatment Type: Individual Therapy  Reported Symptoms: anxiety, prior depression much better  Mental Status Exam:  Appearance:   Casual     Behavior:  Appropriate, Sharing and Motivated  Motor:  Normal  Speech/Language:   Clear and Coherent  Affect:  anxious  Mood:  anxious  Thought process:  goal directed  Thought content:    WNL  Sensory/Perceptual disturbances:    WNL  Orientation:  oriented to person, place, time/date, situation, day of week, month of year and year  Attention:  Good  Concentration:  Good and Fair  Memory:  WNL  Fund of knowledge:   Good  Insight:    Good and Fair  Judgment:   Good and Fair  Impulse Control:  Good   Risk Assessment: Danger to Self:  No Self-injurious Behavior: No Danger to Others: No Duty to Warn:no Physical Aggression / Violence:No  Access to Firearms a concern: No  Gang Involvement:No    Subjective: Patient reporting "anxiety and that he is continuing to work on managing it better, hoping for it to decrease."   Interventions: Cognitive Behavioral Therapy and Solution-Oriented/Positive Psychology  Diagnosis:   ICD-10-CM   1. Generalized anxiety disorder  F41.1     Plan: Patient not signing tx plan on computer screen due to [place].  Treatment Goals: Goals remain the same and progress in being updated beloweach session.We reviewed goals today and talked about progress noted below.  Long Term Goal: Develop healthy interpersonal relationships that lead to alleviation and help prevent relapse of depression.  Strategy: Patient will intentionally seek to have more interpersonal relationships that are healthy and support the alleviation of depression.  Progressing- (Patient focusing on short term goal today.) See below.  Short Term Goal: Identify  and replaceanxious/depressive thinking that leads to feeling moreanxious/depressed and leads toanxious/depressive actions.  Strategy: Patient will replace negative and self-defeating self-talk with more realistic and positive messages.  Progressing: Patient in today reporting anxiety and is working with strategies/goals per his tx plan above to try to better manage anxiety and stress. States he is also following through on reflecting more on his behavior and thoughts especially when he feels he has not handled something well, and try to discern what to do next time in similar situations. Wanted to discuss some issues with his father, past and present. Father was not available to patient much as he was growing up. Some anger he has held onto for years as dad was not there for him but he did have other men in family that gave him attention. Didn't really have a relationship with dad and does see him occasionally but "not often" maybe 1-2 times per year. Talked about some sensitive issues and seemed to come to some better conclusions where he can let go of older hurts and disappointments. Admits he does hold onto grudges and worked with that some in session today, understanding the need to work on letting go at times, again, so patient can move forward more successfully.  Used some examples he gave to work on this today which helped him understand the benefits of letting go in order to move forward, and practicing some  forgiving as needed.  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  Still trying to be  more motivated to work on healthier eating patterns.  Encouraged patient to intentionally look for more positives every day, staying in the present and work on what he can change, setting and keeping healthy boundaries with others, being in contact with people that are supportive of him, let his faith be a source of help in his emotional health, and realize  that the efforts he is putting forth to really make some good changes both behaviorally and in his mindset, which she has already helped him lessen his depression and seems to be helping his anxiety as well.  Goal review and progress/challenges noted with patient.  Next appointment within 3 weeks.   Shanon Ace, LCSW

## 2020-09-16 ENCOUNTER — Ambulatory Visit (INDEPENDENT_AMBULATORY_CARE_PROVIDER_SITE_OTHER): Admitting: Family Medicine

## 2020-09-16 ENCOUNTER — Other Ambulatory Visit: Payer: Self-pay

## 2020-09-16 ENCOUNTER — Encounter: Payer: Self-pay | Admitting: Family Medicine

## 2020-09-16 VITALS — BP 135/76 | HR 93 | Ht 73.0 in | Wt 304.8 lb

## 2020-09-16 DIAGNOSIS — R7309 Other abnormal glucose: Secondary | ICD-10-CM

## 2020-09-16 DIAGNOSIS — E876 Hypokalemia: Secondary | ICD-10-CM

## 2020-09-16 DIAGNOSIS — R7989 Other specified abnormal findings of blood chemistry: Secondary | ICD-10-CM

## 2020-09-16 DIAGNOSIS — K219 Gastro-esophageal reflux disease without esophagitis: Secondary | ICD-10-CM

## 2020-09-16 DIAGNOSIS — Z9989 Dependence on other enabling machines and devices: Secondary | ICD-10-CM

## 2020-09-16 DIAGNOSIS — J310 Chronic rhinitis: Secondary | ICD-10-CM

## 2020-09-16 DIAGNOSIS — G4733 Obstructive sleep apnea (adult) (pediatric): Secondary | ICD-10-CM

## 2020-09-16 DIAGNOSIS — J329 Chronic sinusitis, unspecified: Secondary | ICD-10-CM

## 2020-09-16 DIAGNOSIS — R6882 Decreased libido: Secondary | ICD-10-CM | POA: Diagnosis not present

## 2020-09-16 MED ORDER — FLUTICASONE PROPIONATE 50 MCG/ACT NA SUSP: 2.0000 | Freq: Every day | NASAL | 3 refills | Status: AC

## 2020-09-16 NOTE — Patient Instructions (Addendum)
Thank you for coming to the office today.  Labs at [facility] including chemistry sugar and testosterone  Stay tuned for update w/ referral to Urology after lab results reviewed if T still low  [facility] -[address] [place],  [place]  [address] Phone: [phone]  Chronic sinuses, likely allergic cause. Can be seasonal  Start nasal steroid Flonase 2 sprays in each nostril daily for 4-6 weeks, may repeat course seasonally or as needed  If not improved it could be from stomach acid reflux as well, may not cause heartburn but can cause coughing etc still., message or call we can order a stomach acid blocker again if you are interested OMeprazole or other option.  I think the pravastatin for cholesterol is much stronger than the red rice yeast, that one is a down grade same type of med but not as strong, it is natural but for now let's keep pravastatin and in future may need to increase dose or change med if still not effective.  ---------------------------  Voltaren topical cream / gel as needed for muscle joint pain up to 4 times daily as needed for pain.  [facility] for this round  - Let me know BEFORE 7 months for blood work, so we can coordinate which tests you need.  DUE for FASTING BLOOD WORK no food or drink after midnight before the lab appointment, only water or coffee without cream/sugar on the morning of)  SCHEDULE "Lab Only" visit in the morning at the clinic for lab draw in 7 MONTHS   - Make sure Lab Only appointment is at about 1 week before your next appointment, so that results will be available  For Lab Results, once available within 2-3 days of blood draw, you can can log in to MyChart online to view your results and a brief explanation. Also, we can discuss results at next follow-up visit.   Please schedule a Follow-up Appointment to: Return in about 7 months (around 04/18/2021) for 7 month fasting lab  only then 1 week later Annual Physical.  If you have any other questions or concerns, please feel free to call the office or send a message through [facility]. You may also schedule an earlier appointment if necessary.  Additionally, you may be receiving a survey about your experience at our office within a few days to 1 week by e-mail or mail. We value your feedback.  Nobie Putnam, DO [place]

## 2020-09-16 NOTE — Progress Notes (Addendum)
Subjective:    Patient ID: Joseph Wilcox, male    DOB: 1971/01/31, 50 y.o.   MRN: [id number]  MONTAGUE CORELLA is a 50 y.o. male presenting on 09/16/2020 for Low testosterone  and Hypertension   HPI   OSA, on CPAP - Patient reports prior history of dx OSA and on CPAP - Today reports that sleep apnea is well controlled. He uses the CPAP machine every night. Tolerates the machine well, and thinks that sleeps much better with it and feels good. No new concerns or symptoms.  Low Testosterone in Male Last visit 04/2020, had labs showed Testosterone 201 low. He has had chronic symptoms of decreased Libido, fatigue Today here for repeat lab check orders for testosterone. No improvement since last visit. Has never been on testosterone therapy.  Chronic Cough / Allergic Rhinitis / GERD He admits episodic issues with cough seems sporadic, without productive cough or other associated respiratory symptoms. Has sinus drainage and post nasal drainage at times contributing to his cough. He also describes history of GERD seems like this may affect him, but not as severe, not having as much heartburn on tums PRN in past on medicine. Denies dyspnea or productive cough or fever  Pre-Diabetes / Morbid Obesity BMI >40 Last Lab A1c 5.6 04/2020 with improvement Fam history of DM  Chronic Low Back Pain Describes chronic issue with episodic flare with low back pain. Similar issue in past, now seems more mild has bilateral lower back pain with episodic flare up describes muscle tension and spasm with pain and worse with movement.   Depression screen [facility] 2/9 04/30/2020 01/29/2020 05/02/2019  Decreased Interest 1 1 1   Down, Depressed, Hopeless 1 1 0  PHQ - 2 Score 2 2 1   Altered sleeping 1 0 0  Tired, decreased energy 1 1 1   Change in appetite 1 1 0  Feeling bad or failure about yourself  1 0 0  Trouble concentrating 1 0 0  Moving slowly or fidgety/restless 1 0 0  Suicidal thoughts 0 0 0  PHQ-9  Score 8 4 2   Difficult doing work/chores Not difficult at all Not difficult at all Not difficult at all  Some encounter information is confidential and restricted. Go to Review Flowsheets activity to see all data.  Some recent data might be hidden    Social History   Tobacco Use   Smoking status: Former Smoker    Quit date: 07/05/2010    Years since quitting: 10.2   Smokeless tobacco: Former Counsellor Use: Never used  Substance Use Topics   Alcohol use: Yes    Alcohol/week: 0.0 standard drinks    Comment: Occasional    Drug use: No    Review of Systems Per HPI unless specifically indicated above     Objective:    BP 135/76   Pulse 93   Ht 6\' 1"  (1.854 m)   Wt (!) 304 lb 12.8 oz (138.3 kg)   SpO2 98%   BMI 40.21 kg/m   Wt Readings from Last 3 Encounters:  09/16/20 (!) 304 lb 12.8 oz (138.3 kg)  04/30/20 293 lb (132.9 kg)  01/29/20 (!) 302 lb (137 kg)    Physical Exam Vitals and nursing note reviewed.  Constitutional:      General: He is not in acute distress.    Appearance: He is well-developed. He is not diaphoretic.     Comments: Well-appearing, comfortable, cooperative  HENT:  Head: Normocephalic and atraumatic.  Eyes:     General:        Right eye: No discharge.        Left eye: No discharge.     Conjunctiva/sclera: Conjunctivae normal.  Cardiovascular:     Rate and Rhythm: Normal rate.  Pulmonary:     Effort: Pulmonary effort is normal. No respiratory distress.     Breath sounds: Normal breath sounds. No wheezing, rhonchi or rales.  Skin:    General: Skin is warm and dry.     Findings: No erythema or rash.  Neurological:     Mental Status: He is alert and oriented to person, place, and time.  Psychiatric:        Behavior: Behavior normal.     Comments: Well groomed, good eye contact, normal speech and thoughts   Results for orders placed or performed in visit on 04/30/20  Hemoglobin A1c  Result Value Ref Range   Hgb A1c MFr  Bld 5.6 <5.7 % of total Hgb   Mean Plasma Glucose 114 (calc)   eAG (mmol/L) 6.3 (calc)  CBC with Differential/Platelet  Result Value Ref Range   WBC 9.3 3.8 - 10.8 Thousand/uL   RBC 5.72 4.20 - 5.80 Million/uL   Hemoglobin 13.0 (L) 13.2 - 17.1 g/dL   HCT 39.8 38.5 - 50.0 %   MCV 69.6 (L) 80.0 - 100.0 fL   MCH 22.7 (L) 27.0 - 33.0 pg   MCHC 32.7 32.0 - 36.0 g/dL   RDW 17.3 (H) 11.0 - 15.0 %   Platelets 352 140 - 400 Thousand/uL   MPV 9.4 7.5 - 12.5 fL   Neutro Abs 6,203 1,500 - 7,800 cells/uL   Lymphs Abs 2,353 850 - 3,900 cells/uL   Absolute Monocytes 595 200 - 950 cells/uL   Eosinophils Absolute 121 15 - 500 cells/uL   Basophils Absolute 28 0 - 200 cells/uL   Neutrophils Relative % 66.7 %   Total Lymphocyte 25.3 %   Monocytes Relative 6.4 %   Eosinophils Relative 1.3 %   Basophils Relative 0.3 %   Smear Review    COMPLETE METABOLIC PANEL WITH GFR  Result Value Ref Range   Glucose, Bld 87 65 - 99 mg/dL   BUN 10 7 - 25 mg/dL   Creat 1.32 0.60 - 1.35 mg/dL   GFR, Est Non African American 64 > OR = 60 mL/min/1.34m2   GFR, Est African American 74 > OR = 60 mL/min/1.13m2   BUN/Creatinine Ratio NOT APPLICABLE 6 - 22 (calc)   Sodium 139 135 - 146 mmol/L   Potassium 3.4 (L) 3.5 - 5.3 mmol/L   Chloride 100 98 - 110 mmol/L   CO2 28 20 - 32 mmol/L   Calcium 9.5 8.6 - 10.3 mg/dL   Total Protein 7.0 6.1 - 8.1 g/dL   Albumin 4.2 3.6 - 5.1 g/dL   Globulin 2.8 1.9 - 3.7 g/dL (calc)   AG Ratio 1.5 1.0 - 2.5 (calc)   Total Bilirubin 0.7 0.2 - 1.2 mg/dL   Alkaline phosphatase (APISO) 74 36 - 130 U/L   AST 17 10 - 40 U/L   ALT 17 9 - 46 U/L  Lipid panel  Result Value Ref Range   Cholesterol 249 (H) <200 mg/dL   HDL 46 > OR = 40 mg/dL   Triglycerides 206 (H) <150 mg/dL   LDL Cholesterol (Calc) 166 (H) mg/dL (calc)   Total CHOL/HDL Ratio 5.4 (H) <5.0 (calc)   Non-HDL Cholesterol (Calc)  203 (H) <130 mg/dL (calc)  PSA  Result Value Ref Range   PSA 0.23 < OR = 4.0 ng/mL  Testosterone   Result Value Ref Range   Testosterone 201 (L) 250 - 827 ng/dL  Hepatitis C antibody  Result Value Ref Range   Hepatitis C Ab NON-REACTIVE NON-REACTI   SIGNAL TO CUT-OFF 0.02 <1.00  TSH  Result Value Ref Range   TSH 1.53 0.40 - 4.50 mIU/L      Assessment & Plan:   Problem List Items Addressed This Visit     OSA on CPAP    Improved control now of chronic OSA on CPAP - Good adherence to CPAP nightly, tolerating - Continue current CPAP therapy, patient seems to be benefiting from therapy       Gastroesophageal reflux disease without esophagitis   Elevated hemoglobin A1c   Relevant Orders   Hemoglobin A1c (Completed)   Chronic rhinosinusitis   Relevant Medications   fluticasone (FLONASE) 50 MCG/ACT nasal spray   Other Visit Diagnoses     Low testosterone in male    -  Primary   Relevant Orders   Testosterone (Completed)   Decreased libido       Relevant Orders   Testosterone (Completed)   Hypokalemia       Relevant Orders   Basic Metabolic Panel (BMET) (Completed)       #Low Testosterone in male Last lab low T 201 (04/2020) Symptomatic with reduced libido, fatigue Order repeat testosterone lab test today ([facility]) will get drawn soon, if low < 250 consistent with low testosterone, we will place referral to [facility] for consultation on management of low testosterone.  #Hypokalemia Prior lab K 3.4, will recheck on chemistry now  #Chronic Cough Rhinosinusitis GERD Likely cause with sinus drainage / post nasal drip and discussed this can cause or provoke episodic cough Lungs clear today no sign of other pulmonary concern Also can be underlying GERD sup optimal control now remains off PPI, in past was on therapy, may be beneficial Will start with treating sinuses - Start nasal steroid Flonase 2 sprays in each nostril daily for 4-6 weeks, may repeat course seasonally or as needed  If not improved can contact us within 2-4 weeks can add PPI therapy back on board  omeprazole 20-40 mg daily can send new rx if need can treat with both options  #Elevated A1c vs PreDM Last lab 04/2020 much improved A1c 5.6 Will recheck now  Meds ordered this encounter  Medications   fluticasone (FLONASE) 50 MCG/ACT nasal spray    Sig: Place 2 sprays into both nostrils daily. Use for 4-6 weeks then stop and use seasonally or as needed.    Dispense:  16 g    Refill:  3     Follow up plan: Return in about 7 months (around 04/18/2021) for 7 month fasting lab only then 1 week later Annual Physical.  Labs printed today for [facility] for A1c, BMET, Testosterone - will f/u results  Future labs in 04/2021 will need to review with patient prior to ordering, he will f/u with me prior to lab.  Nobie Putnam, [facility] 09/16/2020, 2:14 PM

## 2020-09-20 LAB — BASIC METABOLIC PANEL
BUN/Creatinine Ratio: 10 (ref 9–20)
BUN: 12 mg/dL (ref 6–24)
CO2: 24 mmol/L (ref 20–29)
Calcium: 9.7 mg/dL (ref 8.7–10.2)
Chloride: 98 mmol/L (ref 96–106)
Creatinine, Ser: 1.17 mg/dL (ref 0.76–1.27)
Glucose: 97 mg/dL (ref 65–99)
Potassium: 3.7 mmol/L (ref 3.5–5.2)
Sodium: 141 mmol/L (ref 134–144)
eGFR: 77 mL/min/{1.73_m2} (ref >59)

## 2020-09-20 LAB — TESTOSTERONE: Testosterone: 196 ng/dL — ABNORMAL LOW (ref 264–916)

## 2020-09-20 LAB — HEMOGLOBIN A1C
Est. average glucose Bld gHb Est-mCnc: 128 mg/dL
Hgb A1c MFr Bld: 6.1 % — ABNORMAL HIGH (ref 4.8–5.6)

## 2020-09-22 DIAGNOSIS — R7989 Other specified abnormal findings of blood chemistry: Secondary | ICD-10-CM

## 2020-09-22 DIAGNOSIS — R6882 Decreased libido: Secondary | ICD-10-CM

## 2020-09-30 ENCOUNTER — Other Ambulatory Visit: Payer: Self-pay

## 2020-09-30 ENCOUNTER — Ambulatory Visit (INDEPENDENT_AMBULATORY_CARE_PROVIDER_SITE_OTHER): Admitting: Psychiatry

## 2020-09-30 ENCOUNTER — Encounter: Payer: Self-pay | Admitting: Psychiatry

## 2020-09-30 DIAGNOSIS — F331 Major depressive disorder, recurrent, moderate: Secondary | ICD-10-CM | POA: Diagnosis not present

## 2020-09-30 DIAGNOSIS — F411 Generalized anxiety disorder: Secondary | ICD-10-CM | POA: Diagnosis not present

## 2020-09-30 MED ORDER — SERTRALINE HCL 100 MG PO TABS: 150.0000 mg | ORAL_TABLET | Freq: Every day | ORAL | 0 refills | Status: DC

## 2020-09-30 NOTE — Progress Notes (Signed)
Joseph Wilcox [id number] [birth year] 50 y.o.  Subjective:   Patient ID:  Joseph Wilcox is a 50 y.o. (DOB 1971/04/26) male.  Chief Complaint:  Chief Complaint  Patient presents with  . Follow-up    Anxiety and depression    HPI INRI SOBIESKI presents to the office today for follow-up of anxiety and depression. He reports that Sertraline has been going ok and has been well tolerated. He reports that his mood has been good. He reports that there have been a few situations where he has felt irritated. He reports that he is working on handling difficulty situations differently. He reports that irritation has improved some. He reports mood lability has improved. Denies sad mood. He reports anxiety has improved. Denies catastrophic thinking. He reports occasional rumination and that this has significantly decreased. Denies intrusive memories. He reports that he will put off certain things. He reports that he is sleeping well. Appetite has been good. Energy and motivation have been very low. He attributes this to low testosterone and starts treatment tomorrow. Concentration has been ok. Denies any SI or intrusive thoughts.   Has re-started therapy with Rinaldo Cloud, LCSW.   He is working as a Environmental consultant in a school.   Has not needed Temazepam prn recently.   Past Psychiatric Medication Trials: Duloxetine- Was taking 60 mg. Helpful for both anxiety and depression. Denies any long-term side effects. Initially had a "tickling, fluttering" sensation in chest.  Sertraline Abilify- Was taking 2 mg po qd.  Gabapentin- Being prescribed for sciatic nerve pain. Has not noticed any effect on anxiety Ritalin- Took in childhood for ADD.  Temazepam- Effective Trazodone- Nightmares  AIMS   Flowsheet Row Office Visit from 06/15/2019 in [place] Office Visit from 05/04/2019 in [place] Total Score 0 0    GAD-7   [place] Office Visit from  06/20/2020 in [place] Office Visit from 04/30/2020 in [facility] Office Visit from 12/01/2018 in [facility] Office Visit from 04/06/2018 in [facility] Office Visit from 10/24/2017 in [facility]  Total GAD-7 Score 10 5 0 0 0    PHQ2-9   [place] Office Visit from 06/20/2020 in [place] Office Visit from 04/30/2020 in [facility] Office Visit from 01/29/2020 in [facility] Office Visit from 05/02/2019 in [facility] Office Visit from 03/30/2019 in [place]  PHQ-2 Total Score 4 2 2 1 3   PHQ-9 Total Score 19 8 4 2 14        Review of Systems:  Review of Systems  Gastrointestinal: Negative.   Endocrine:       Recent low testosterone  Musculoskeletal: Positive for arthralgias. Negative for gait problem.  Neurological: Negative for tremors and headaches.  Psychiatric/Behavioral:       Please refer to HPI    Medications: I have reviewed the patient's current medications.  Current Outpatient Medications  Medication Sig Dispense Refill  . amLODipine (NORVASC) 10 MG tablet Take 1 tablet (10 mg total) by mouth daily. 90 tablet 3  . EDARBYCLOR 40-25 MG TABS Take 1 tablet by mouth daily. 90 tablet 3  . fluticasone (FLONASE) 50 MCG/ACT nasal spray Place 2 sprays into both nostrils daily. Use for 4-6 weeks then stop and use seasonally or as needed. 16 g 3  . pravastatin (PRAVACHOL) 20 MG tablet Take 1 tablet (20 mg total) by mouth  daily. 90 tablet 3  . sertraline (ZOLOFT) 100 MG tablet Take 1.5 tablets (150 mg total) by mouth daily. 135 tablet 0  . temazepam (RESTORIL) 15 MG capsule Take 1 capsule (15 mg total) by mouth at bedtime as needed for sleep. (Patient not taking: Reported on 09/30/2020) 30 capsule 2   No current facility-administered medications for this visit.    Medication Side Effects: None  Allergies: No Known  Allergies  Past Medical History:  Diagnosis Date  . Allergy   . Anxiety   . Depression   . GERD (gastroesophageal reflux disease)   . High cholesterol   . Hx of migraines     Family History  Problem Relation Age of Onset  . Diabetes Mother   . Prostate cancer Neg Hx   . Colon cancer Neg Hx     Social History   Socioeconomic History  . Marital status: Unknown    Spouse name: Not on file  . Number of children: Not on file  . Years of education: Not on file  . Highest education level: Not on file  Occupational History  . Not on file  Tobacco Use  . Smoking status: Former Smoker    Quit date: 07/05/2010    Years since quitting: 10.2  . Smokeless tobacco: Former Network engineer  . Vaping Use: Never used  Substance and Sexual Activity  . Alcohol use: Yes    Alcohol/week: 0.0 standard drinks    Comment: Occasional   . Drug use: No  . Sexual activity: Yes    Partners: Female    Birth control/protection: None  Other Topics Concern  . Not on file  Social History Narrative  . Not on file   Social Determinants of Health   Financial Resource Strain: Not on file  Food Insecurity: Not on file  Transportation Needs: Not on file  Physical Activity: Not on file  Stress: Not on file  Social Connections: Not on file  Intimate Partner Violence: Not on file    Past Medical History, Surgical history, Social history, and Family history were reviewed and updated as appropriate.   Please see review of systems for further details on the patient's review from today.   Objective:   Physical Exam:  There were no vitals taken for this visit.  Physical Exam Constitutional:      General: He is not in acute distress. Musculoskeletal:        General: No deformity.  Neurological:     Mental Status: He is alert and oriented to person, place, and time.     Coordination: Coordination normal.  Psychiatric:        Attention and Perception: Attention and perception normal. He does  not perceive auditory or visual hallucinations.        Mood and Affect: Mood normal. Mood is not anxious or depressed. Affect is not labile, blunt, angry or inappropriate.        Speech: Speech normal.        Behavior: Behavior normal.        Thought Content: Thought content normal. Thought content is not paranoid or delusional. Thought content does not include homicidal or suicidal ideation. Thought content does not include homicidal or suicidal plan.        Cognition and Memory: Cognition and memory normal.        Judgment: Judgment normal.     Comments: Insight intact     Lab Review:     Component Value Date/Time  NA 141 09/19/2020 0813   K 3.7 09/19/2020 0813   CL 98 09/19/2020 0813   CO2 24 09/19/2020 0813   GLUCOSE 97 09/19/2020 0813   GLUCOSE 87 04/30/2020 0835   BUN 12 09/19/2020 0813   CREATININE 1.17 09/19/2020 0813   CREATININE 1.32 04/30/2020 0835   CALCIUM 9.7 09/19/2020 0813   PROT 7.0 04/30/2020 0835   ALBUMIN 4.3 02/21/2017 1600   AST 17 04/30/2020 0835   ALT 17 04/30/2020 0835   ALKPHOS 59 02/21/2017 1600   BILITOT 0.7 04/30/2020 0835   GFRNONAA 64 04/30/2020 0835   GFRAA 74 04/30/2020 0835       Component Value Date/Time   WBC 9.3 04/30/2020 0835   RBC 5.72 04/30/2020 0835   HGB 13.0 (L) 04/30/2020 0835   HGB 12.6 (L) 02/09/2019 0808   HCT 39.8 04/30/2020 0835   HCT 39.0 02/09/2019 0808   PLT 352 04/30/2020 0835   PLT 386 02/09/2019 0808   MCV 69.6 (L) 04/30/2020 0835   MCV 69 (L) 02/09/2019 0808   MCH 22.7 (L) 04/30/2020 0835   MCHC 32.7 04/30/2020 0835   RDW 17.3 (H) 04/30/2020 0835   RDW 17.5 (H) 02/09/2019 0808   LYMPHSABS 2,353 04/30/2020 0835   LYMPHSABS 3.1 02/09/2019 0808   EOSABS 121 04/30/2020 0835   EOSABS 0.1 02/09/2019 0808   BASOSABS 28 04/30/2020 0835   BASOSABS 0.0 02/09/2019 0808    No results found for: POCLITH, LITHIUM   No results found for: PHENYTOIN, PHENOBARB, VALPROATE, CBMZ   .res Assessment: Plan:   Pt seen  for 30 minutes and time spent discussing treatment plan, to include concern about possible metabolic side effects with Abilify since he recently had slightly elevated Hgb A1C and benefit of Abilify is unclear. Discussed discontinuing Abilify to possibly improve metabolic side effects. Discussed that Abilify could be re-started if he experienced any worsening s/s.  Agreed that energy and motivation may improve with testosterone treatment. Discussed considering Wellbutrin XL in the future if low energy and motivation persist. Continue Sertraline 150 mg po qd for mood and anxiety.  Recommend continuing therapy with Rinaldo Cloud, LCSW.  Pt to follow-up with this provider in 6 weeks or sooner if clinically indicated.  Patient advised to contact office with any questions, adverse effects, or acute worsening in signs and symptoms.  Deonte was seen today for follow-up.  Diagnoses and all orders for this visit:  Generalized anxiety disorder -     sertraline (ZOLOFT) 100 MG tablet; Take 1.5 tablets (150 mg total) by mouth daily.  MDD (major depressive disorder), recurrent episode, moderate (HCC) -     sertraline (ZOLOFT) 100 MG tablet; Take 1.5 tablets (150 mg total) by mouth daily.     Please see After Visit Summary for patient specific instructions.  Future Appointments  Date Time Provider [place]  10/01/2020  2:15 PM Abbie Sons, MD [facility] None  10/29/2020  3:40 PM Olin Hauser, DO [facility]  10/30/2020  3:00 PM Shanon Ace, LCSW [facility] None  11/28/2020  9:45 AM Thayer Headings, PMHNP [facility] None    No orders of the defined types were placed in this encounter.   -------------------------------

## 2020-10-01 ENCOUNTER — Encounter: Payer: Self-pay | Admitting: Urology

## 2020-10-01 ENCOUNTER — Ambulatory Visit (INDEPENDENT_AMBULATORY_CARE_PROVIDER_SITE_OTHER): Admitting: Urology

## 2020-10-01 ENCOUNTER — Ambulatory Visit: Admitting: Psychiatry

## 2020-10-01 VITALS — BP 142/79 | HR 96 | Ht 73.0 in | Wt 300.0 lb

## 2020-10-01 DIAGNOSIS — R7989 Other specified abnormal findings of blood chemistry: Secondary | ICD-10-CM

## 2020-10-01 DIAGNOSIS — E291 Testicular hypofunction: Secondary | ICD-10-CM | POA: Diagnosis not present

## 2020-10-01 NOTE — Progress Notes (Signed)
10/01/2020 2:06 PM   Joseph Wilcox 11-01-1970 [id number]  Referring provider: Olin Hauser, DO [address] [place],  [place] [address]  Chief Complaint  Patient presents with  . Hypogonadism    HPI: Joseph Wilcox is a 50 y.o. male referred for hypogonadism.   1 year history significant decreased libido, fatigue, low energy and decreased motivation  Labs 04/2020 with total testosterone low at 201, PSA 0.23  Follow-up testosterone level 09/19/2020 was 196  No bothersome LUTS  No prior urologic history  Denies dysuria, gross hematuria  Denies flank, abdominal or pelvic pain   PMH: Past Medical History:  Diagnosis Date  . Allergy   . Anxiety   . Depression   . GERD (gastroesophageal reflux disease)   . High cholesterol   . Hx of migraines     Surgical History: Past Surgical History:  Procedure Laterality Date  . COLONOSCOPY WITH PROPOFOL N/A 03/09/2019   Procedure: COLONOSCOPY WITH PROPOFOL;  Surgeon: Joseph Bellows, MD;  Location: [facility] ENDOSCOPY;  Service: Gastroenterology;  Laterality: N/A;  . ESOPHAGOGASTRODUODENOSCOPY (EGD) WITH PROPOFOL N/A 03/09/2019   Procedure: ESOPHAGOGASTRODUODENOSCOPY (EGD) WITH PROPOFOL;  Surgeon: Joseph Bellows, MD;  Location: [facility] ENDOSCOPY;  Service: Gastroenterology;  Laterality: N/A;  . FOOT SURGERY  2002   Bone spur  . TONSILLECTOMY      Home Medications:  Allergies as of 10/01/2020   No Known Allergies     Medication List       Accurate as of October 01, 2020  2:06 PM. If you have any questions, ask your nurse or doctor.        amLODipine 10 MG tablet Commonly known as: NORVASC Take 1 tablet (10 mg total) by mouth daily.   Edarbyclor 40-25 MG Tabs Generic drug: Azilsartan-Chlorthalidone Take 1 tablet by mouth daily.   fluticasone 50 MCG/ACT nasal spray Commonly known as: FLONASE Place 2 sprays into both nostrils daily. Use for 4-6 weeks then stop and use seasonally or as needed.   pravastatin 20 MG  tablet Commonly known as: PRAVACHOL Take 1 tablet (20 mg total) by mouth daily.   sertraline 100 MG tablet Commonly known as: ZOLOFT Take 1.5 tablets (150 mg total) by mouth daily.   temazepam 15 MG capsule Commonly known as: RESTORIL Take 1 capsule (15 mg total) by mouth at bedtime as needed for sleep.       Allergies: No Known Allergies  Family History: Family History  Problem Relation Age of Onset  . Diabetes Mother   . Prostate cancer Neg Hx   . Colon cancer Neg Hx     Social History:  reports that he quit smoking about 10 years ago. He has quit using smokeless tobacco. He reports current alcohol use. He reports that he does not use drugs.   Physical Exam: BP (!) 142/79   Pulse 96   Ht 6\' 1"  (1.854 m)   Wt 300 lb (136.1 kg)   BMI 39.58 kg/m   Constitutional:  Alert and oriented, No acute distress. HEENT: [place] AT, moist mucus membranes.  Trachea midline, no masses. Cardiovascular: No clubbing, cyanosis, or edema. Respiratory: Normal respiratory effort, no increased work of breathing. GI: Abdomen is soft, nontender, nondistended, no abdominal masses GU: Phallus without lesions, testes descended bilaterally without masses or tenderness, estimated volume ~ 20 cc bilaterally Skin: No rashes, bruises or suspicious lesions. Neurologic: Grossly intact, no focal deficits, moving all 4 extremities. Psychiatric: Normal mood and affect.  Laboratory Data:  Lab Results  Component Value Date   PSA 0.23 04/30/2020    Lab Results  Component Value Date   TESTOSTERONE 196 (L) 09/19/2020     Assessment & Plan:    1.  Hypogonadism  Bothersome symptoms  We discussed various methods of TRT including topicals, intramuscular injection, subcutaneous injection and recent approval of oral testosterone  Potential side effects of testosterone replacement were discussed including stimulation of benign prostatic growth with lower urinary tract symptoms; erythrocytosis; edema;  gynecomastia; worsening sleep apnea; venous thromboembolism; testicular atrophy and infertility. Recent studies suggesting an increased incidence of heart attack and stroke in patients taking testosterone was discussed. He was informed there is conflicting evidence regarding the impact of testosterone therapy on cardiovascular risk. The theoretical risk of growth stimulation of an undetected prostate cancer was also discussed.  He was informed that current evidence does not provide any definitive answers regarding the risks of testosterone therapy on prostate cancer and cardiovascular disease. The need for periodic monitoring of his testosterone level, PSA, hematocrit and DRE was discussed.  LH/prolactin ordered for pituitary evaluation  If LH and prolactin normal he would like to see if insurance would cover Joseph Masson, MD  [place] [address], [place], [place] [address] [phone]

## 2020-10-02 ENCOUNTER — Telehealth: Payer: Self-pay | Admitting: Urology

## 2020-10-02 ENCOUNTER — Encounter: Payer: Self-pay | Admitting: *Deleted

## 2020-10-02 ENCOUNTER — Encounter: Payer: Self-pay | Admitting: Urology

## 2020-10-02 DIAGNOSIS — E291 Testicular hypofunction: Secondary | ICD-10-CM | POA: Insufficient documentation

## 2020-10-02 LAB — PROLACTIN: Prolactin: 12.2 ng/mL (ref 4.0–15.2)

## 2020-10-02 LAB — LUTEINIZING HORMONE: LH: 4 m[IU]/mL (ref 1.7–8.6)

## 2020-10-02 MED ORDER — XYOSTED 75 MG/0.5ML ~~LOC~~ SOAJ: 75.0000 mg | SUBCUTANEOUS | 3 refills | Status: DC

## 2020-10-02 NOTE — Telephone Encounter (Signed)
Prolactin and LH levels were normal.  Rx Xyosted sent to [facility], [place]

## 2020-10-06 ENCOUNTER — Telehealth: Payer: Self-pay | Admitting: Urology

## 2020-10-06 NOTE — Telephone Encounter (Signed)
Pt. LVM about a pre-authorization and an issue with his zip code that was preventing the pre-auth. Pt. Stated that the issue had been resolved and you should be able to get the authorization now.  Please call the pt. @ [phone]

## 2020-10-09 ENCOUNTER — Telehealth: Payer: Self-pay | Admitting: Family Medicine

## 2020-10-09 NOTE — Telephone Encounter (Signed)
Patient notified insurance company has denied the [place]. They are requesting patient try Testosterone gel.

## 2020-10-10 MED ORDER — TESTOSTERONE 20.25 MG/ACT (1.62%) TD GEL: TRANSDERMAL | 2 refills | Status: DC

## 2020-10-10 NOTE — Telephone Encounter (Signed)
Rx sent.  Needs follow-up visit with testosterone level prior 6-8 weeks

## 2020-10-10 NOTE — Telephone Encounter (Signed)
LMOM informed patient testosterone gel has been sent to the pharmacy. When he starts the medication he is to call our office to schedule a lab visit for testosterone level in 6-8 weeks.

## 2020-10-14 ENCOUNTER — Telehealth: Payer: Self-pay | Admitting: Family Medicine

## 2020-10-14 MED ORDER — TESTOSTERONE 10 MG/ACT (2%) TD GEL: TRANSDERMAL | 2 refills | Status: AC

## 2020-10-14 NOTE — Telephone Encounter (Signed)
The insurance company has denied Testosterone gel as well as Xyosted. They are saying he needs to try Fortesta, Testim, Androderm, Testosterone 1% or Striant. Please advise

## 2020-10-14 NOTE — Telephone Encounter (Signed)
Rx Harlene Salts was sent to pharmacy.  Needs a follow-up office visit with testosterone level prior 6 weeks after starting the medication

## 2020-10-20 ENCOUNTER — Other Ambulatory Visit: Payer: Self-pay | Admitting: Family Medicine

## 2020-10-20 ENCOUNTER — Telehealth: Payer: Self-pay | Admitting: Family Medicine

## 2020-10-20 DIAGNOSIS — E291 Testicular hypofunction: Secondary | ICD-10-CM

## 2020-10-20 NOTE — Telephone Encounter (Signed)
Left message informing patient the Joseph Wilcox has been approved. He is to call our office when he gets the medication to schedule a lab appointment in 6-8 weeks.

## 2020-10-20 NOTE — Telephone Encounter (Signed)
LMOM for patient to pick up Rx and call our office to schedule a lab appointment 6-8 weeks.

## 2020-10-29 ENCOUNTER — Ambulatory Visit: Admitting: Family Medicine

## 2020-10-30 ENCOUNTER — Ambulatory Visit (INDEPENDENT_AMBULATORY_CARE_PROVIDER_SITE_OTHER): Admitting: Psychiatry

## 2020-10-30 ENCOUNTER — Other Ambulatory Visit: Payer: Self-pay

## 2020-10-30 DIAGNOSIS — F411 Generalized anxiety disorder: Secondary | ICD-10-CM | POA: Diagnosis not present

## 2020-10-30 NOTE — Progress Notes (Signed)
[facility] Counselor/Therapist Progress Note  Patient ID: Joseph Wilcox, MRN: [id number],    Date: 10/30/2020  Time Spent: 60 minutes    3:00pm to 4:00pm  Treatment Type: Individual Therapy  Reported Symptoms: Anxiety  Mental Status Exam:  Appearance:   Neat     Behavior:  Appropriate, Sharing and Motivated  Motor:  Normal  Speech/Language:   Clear and Coherent  Affect:  anxious  Mood:  anxious  Thought process:  goal directed  Thought content:    WNL  Sensory/Perceptual disturbances:    WNL  Orientation:  oriented to person, place, time/date, situation, day of week, month of year and year  Attention:  Good  Concentration:  Good  Memory:  some forgetting   Fund of knowledge:   Good  Insight:    Good  Judgment:   Good  Impulse Control:  Good   Risk Assessment: Danger to Self:  No Self-injurious Behavior: No Danger to Others: No Duty to Warn:no Physical Aggression / Violence:No  Access to Firearms a concern: No  Gang Involvement:No   Subjective: Patient today reports anxiety is his main symptom and seems worse in the mornings before I leave for work, mostly re: problems with prior job and some of the personnel involved.    Interventions: Solution-Oriented/Positive Psychology and Ego-Supportive  Diagnosis:   ICD-10-CM   1. Generalized anxiety disorder  F41.1      Plan: Patient not signing tx plan on computer screen due to [place].  Treatment Goals: Goals remain the same and progress in being updated beloweach session.We reviewed goals today and talked about progress noted below.  Long Term Goal: Develop healthy interpersonal relationships that lead to alleviation and help prevent relapse of depression.  Strategy: Patient will intentionally seek to have more interpersonal relationships that are healthy and support the alleviation of depression.  Progressing- (Patient focusing on short term goal today.) See below.  Short Term  Goal: Identify and replaceanxious/depressive thinking that leads to feeling moreanxious/depressed and leads toanxious/depressive actions.  Strategy: Patient will replace negative and self-defeating self-talk with more realistic and positive messages.  Progressing: Patient in today reporting anxiety as main symptom. Sometime notices anxiety is a little less but "anything different and if anything changes, it stirs my anxiety up more.  Is definitely making progress and we reviewed strategies previously discussed to help in managing anxiety and stress.  Has had some return of thoughts in reference to former job and losing it, resulting in anxious/depressive feelings, which worked on today using his short-term goal and strategy in treatment plan above.  It gave some specific examples of thoughts that happen frequently and we focused on those and being able to change them.  Also worked on his self-talk to strengthen it to be more positive and less self-defeating. Patient's progress is very noticeable and he continues to work on his treatment goals and moving forward.  Still down on himself at times but not as much as in the past.  Working to be more accepting of himself and not hold onto grudges as in the past.  Wife continues to be very supportive.  Seems to be happy in his work and his peers seem to be glad he is there.  Patient reports he is still trying to motivate himself more to eat healthier and does so on some occasions.  Encouraged him to get outside some each day and walk as exercise, set and keep healthy boundaries with others, stay in the present  and focus on what he can change, stay in contact with people that are supportive of him, that his faith be a resource in his emotional health, practice more positive self talk consistently, and feel good about the strength he is showing and challenging circumstances to stay focused on his goals, make progress and continue to move forward in a positive  direction.   Goal review and progress/challenges noted with patient.  Next appointment within 3-4 weeks.   Shanon Ace, LCSW

## 2020-11-02 ENCOUNTER — Encounter (HOSPITAL_COMMUNITY): Payer: Self-pay | Admitting: Emergency Medicine

## 2020-11-02 ENCOUNTER — Emergency Department (HOSPITAL_COMMUNITY)
Admission: EM | Admit: 2020-11-02 | Discharge: 2020-11-02 | Disposition: A | Discharge status: Home / Self Care | Attending: Emergency Medicine | Admitting: Emergency Medicine

## 2020-11-02 ENCOUNTER — Emergency Department (HOSPITAL_COMMUNITY)

## 2020-11-02 DIAGNOSIS — R0602 Shortness of breath: Secondary | ICD-10-CM | POA: Insufficient documentation

## 2020-11-02 DIAGNOSIS — I10 Essential (primary) hypertension: Secondary | ICD-10-CM | POA: Insufficient documentation

## 2020-11-02 DIAGNOSIS — Z87891 Personal history of nicotine dependence: Secondary | ICD-10-CM | POA: Diagnosis not present

## 2020-11-02 DIAGNOSIS — E876 Hypokalemia: Secondary | ICD-10-CM | POA: Insufficient documentation

## 2020-11-02 DIAGNOSIS — R112 Nausea with vomiting, unspecified: Secondary | ICD-10-CM | POA: Diagnosis not present

## 2020-11-02 DIAGNOSIS — Z79899 Other long term (current) drug therapy: Secondary | ICD-10-CM | POA: Insufficient documentation

## 2020-11-02 DIAGNOSIS — R0789 Other chest pain: Secondary | ICD-10-CM | POA: Diagnosis not present

## 2020-11-02 LAB — BASIC METABOLIC PANEL
Anion gap: 10 (ref 5–15)
BUN: 10 mg/dL (ref 6–20)
CO2: 29 mmol/L (ref 22–32)
Calcium: 9.4 mg/dL (ref 8.9–10.3)
Chloride: 99 mmol/L (ref 98–111)
Creatinine, Ser: 1.46 mg/dL — ABNORMAL HIGH (ref 0.61–1.24)
GFR, Estimated: 59 mL/min — ABNORMAL LOW (ref >60)
Glucose, Bld: 86 mg/dL (ref 70–99)
Potassium: 3.1 mmol/L — ABNORMAL LOW (ref 3.5–5.1)
Sodium: 138 mmol/L (ref 135–145)

## 2020-11-02 LAB — CBC
HCT: 38.6 % — ABNORMAL LOW (ref 39.0–52.0)
Hemoglobin: 12.6 g/dL — ABNORMAL LOW (ref 13.0–17.0)
MCH: 22.6 pg — ABNORMAL LOW (ref 26.0–34.0)
MCHC: 32.6 g/dL (ref 30.0–36.0)
MCV: 69.3 fL — ABNORMAL LOW (ref 80.0–100.0)
Platelets: 439 10*3/uL — ABNORMAL HIGH (ref 150–400)
RBC: 5.57 MIL/uL (ref 4.22–5.81)
RDW: 15.8 % — ABNORMAL HIGH (ref 11.5–15.5)
WBC: 12.4 10*3/uL — ABNORMAL HIGH (ref 4.0–10.5)
nRBC: 0 % (ref 0.0–0.2)

## 2020-11-02 LAB — HEPATIC FUNCTION PANEL
ALT: 23 U/L (ref 0–44)
AST: 24 U/L (ref 15–41)
Albumin: 3.8 g/dL (ref 3.5–5.0)
Alkaline Phosphatase: 68 U/L (ref 38–126)
Bilirubin, Direct: <0.1 mg/dL (ref 0.0–0.2)
Total Bilirubin: 0.9 mg/dL (ref 0.3–1.2)
Total Protein: 6.7 g/dL (ref 6.5–8.1)

## 2020-11-02 LAB — TROPONIN I (HIGH SENSITIVITY)
Troponin I (High Sensitivity): 5 ng/L (ref <18)
Troponin I (High Sensitivity): 4 ng/L (ref <18)

## 2020-11-02 LAB — LIPASE, BLOOD: Lipase: 31 U/L (ref 11–51)

## 2020-11-02 MED ORDER — ALBUTEROL SULFATE HFA 108 (90 BASE) MCG/ACT IN AERS: 1.0000 | INHALATION_SPRAY | Freq: Two times a day (BID) | RESPIRATORY_TRACT | 0 refills | Status: DC | PRN

## 2020-11-02 MED ORDER — ASPIRIN 81 MG PO CHEW
324.0000 mg | CHEWABLE_TABLET | Freq: Once | ORAL | Status: AC
  Administered 2020-11-02: 324 mg via ORAL
  Filled 2020-11-02: qty 4

## 2020-11-02 MED ORDER — NITROGLYCERIN 0.4 MG SL SUBL
0.4000 mg | SUBLINGUAL_TABLET | SUBLINGUAL | Status: DC | PRN
  Administered 2020-11-02: 0.4 mg via SUBLINGUAL
  Filled 2020-11-02: qty 1

## 2020-11-02 NOTE — ED Triage Notes (Signed)
Pt reports pain to center and R chest while driving to church 1 hour ago.  States he did have SOB, nausea, and vomiting.  Went to gas station and got Tums and took without relief.

## 2020-11-02 NOTE — Discharge Instructions (Addendum)
Continue taking home medications as prescribed. Use albuterol twice a day as needed for cough. Follow-up with the heart clinic listed below for further evaluation of your chest pain. Follow-up with your primary care doctor as needed for further evaluation of your cough. Return to the emergency room if you develop severe worsening chest pain, difficulty breathing, sweating/clamminess, or any new, concerning symptoms.

## 2020-11-02 NOTE — ED Provider Notes (Signed)
[facility] EMERGENCY DEPARTMENT Provider Note   CSN: [id number] Arrival date & time: 11/02/20  1137     History Chief Complaint  Patient presents with  . Chest Pain    Joseph Wilcox is a 50 y.o. male presenting for evaluation of chest pain.  Patient states approximately 1 hour prior to arrival he developed acute onset central chest pressure/heaviness.  He had associated nausea and vomiting, threw up 3 times.  He had shortness of breath at that time.  Since then, pain has improved slightly.  It was severe for about 45 minutes before starting sepsis side on its own.  No previous history of heart problems, has never seen a cardiologist.  He does have a history of heartburn, tried Tums today without improvement of symptoms.  He denies recent fevers, chills, abdominal pain, urinary symptoms, normal bowel movements.  He does report a cough for the past several months, this is unchanged today.  He used to have a heavy smoking history, about 30 years, now only smokes a cigar every once in a while.  He reports intermittent alcohol use, 1 glass of wine yesterday, no alcohol today.  No drug use.  He reports a history of hypertension, hyperlipidemia, for which he takes medication.  No family history of early cardiac death.  Additional history obtained from chart review.  Patient with a history of anxiety, depression, GERD, high cholesterol, hypertension  HPI     Past Medical History:  Diagnosis Date  . Allergy   . Anxiety   . Depression   . GERD (gastroesophageal reflux disease)   . High cholesterol   . Hx of migraines     Patient Active Problem List   Diagnosis Date Noted  . Hypogonadism, male 10/02/2020  . 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 Low testosterone level in male 05/01/2020  . Hallux limitus, right 05/30/2019  . Nontraumatic incomplete tear of left rotator cuff 05/30/2019  . 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 Sickle cell trait ([place]) 05/25/2018  .  Elevated hemoglobin A1c 05/21/2018  . Mixed hyperlipidemia 10/24/2017  . Obesity (BMI 35.0-39.9 without comorbidity) 01/12/2017  . OSA on CPAP 01/12/2017  . Former smoker 01/12/2017  . Generalized anxiety disorder 10/30/2015  . Social anxiety disorder 10/30/2015  . 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 Gastroesophageal reflux disease without esophagitis 10/09/2015  . Essential hypertension 10/09/2015    Past Surgical History:  Procedure Laterality Date  . COLONOSCOPY WITH PROPOFOL N/A 03/09/2019   Procedure: COLONOSCOPY WITH PROPOFOL;  Surgeon: [name], MD;  Location: [facility] ENDOSCOPY;  Service: Gastroenterology;  Laterality: N/A;  . ESOPHAGOGASTRODUODENOSCOPY (EGD) WITH PROPOFOL N/A 03/09/2019   Procedure: ESOPHAGOGASTRODUODENOSCOPY (EGD) WITH PROPOFOL;  Surgeon: [name], MD;  Location: [facility] ENDOSCOPY;  Service: Gastroenterology;  Laterality: N/A;  . FOOT SURGERY  2002   Bone spur  . TONSILLECTOMY         Family History  Problem Relation Age of Onset  . Diabetes Mother   . Prostate cancer Neg Hx   . Colon cancer Neg Hx     Social History   Tobacco Use  . Smoking status: Former Smoker    Quit date: 07/05/2010    Years since quitting: 10.3  . Smokeless tobacco: Former Network engineer  . Vaping Use: Never used  Substance Use Topics  . Alcohol use: Yes    Alcohol/week: 0.0 standard drinks    Comment: Occasional   . Drug use: No  Home Medications Prior to Admission medications   Medication Sig Start Date End Date Taking? Authorizing Provider  amLODipine (NORVASC) 10 MG tablet Take 1 tablet (10 mg total) by mouth daily. 01/29/20  Yes Karamalegos, Devonne Doughty, DO  Aspirin-Acetaminophen-Caffeine (GOODY HEADACHE PO) Take 1 packet by mouth as needed (for headaches).   Yes [provider]  calcium carbonate (TUMS EX) 750 MG chewable tablet Chew 1-2 tablets by mouth as needed for heartburn.   Yes [provider]   EDARBYCLOR 40-25 MG TABS Take 1 tablet by mouth daily. 01/29/20  Yes Karamalegos, Devonne Doughty, DO  fluticasone (FLONASE) 50 MCG/ACT nasal spray Place 2 sprays into both nostrils daily. Use for 4-6 weeks then stop and use seasonally or as needed. Patient taking differently: Place 2 sprays into both nostrils daily as needed for rhinitis or allergies (or seasonal allergies). 09/16/20  Yes Karamalegos, Devonne Doughty, DO  ibuprofen (ADVIL) 200 MG tablet Take 200-400 mg by mouth every 6 (six) hours as needed for mild pain (or headaches).   Yes [provider]  pravastatin (PRAVACHOL) 20 MG tablet Take 1 tablet (20 mg total) by mouth daily. 01/29/20  Yes Karamalegos, Devonne Doughty, DO  sertraline (ZOLOFT) 100 MG tablet Take 1.5 tablets (150 mg total) by mouth daily. 09/30/20 12/29/20 Yes Thayer Headings, PMHNP  temazepam (RESTORIL) 15 MG capsule Take 1 capsule (15 mg total) by mouth at bedtime as needed for sleep. 06/20/20  Yes Thayer Headings, PMHNP  Testosterone (FORTESTA) 10 MG/ACT (2%) GEL Apply 2 pumps to each thigh region daily Patient taking differently: Apply 2 Pump topically See admin instructions. Apply 2 pumps to each thigh region daily 10/14/20  Yes Stoioff, Ronda Fairly, MD    Allergies    Patient has no known allergies.  Review of Systems   Review of Systems  Respiratory: Positive for cough and shortness of breath.   Cardiovascular: Positive for chest pain.  Gastrointestinal: Positive for vomiting.  All other systems reviewed and are negative.   Physical Exam Updated Vital Signs BP 116/82   Pulse 65   Temp 98.5 F (36.9 C) (Oral)   Resp 14   Ht 6\' 1"  (1.854 m)   Wt 136.1 kg   SpO2 97%   BMI 39.58 kg/m   Physical Exam Vitals and nursing note reviewed.  Constitutional:      General: He is not in acute distress.    Appearance: He is well-developed.     Comments: Resting in the bed in no acute distress  HENT:     Head: Normocephalic and atraumatic.  Eyes:     Extraocular  Movements: Extraocular movements intact.     Conjunctiva/sclera: Conjunctivae normal.     Pupils: Pupils are equal, round, and reactive to light.  Cardiovascular:     Rate and Rhythm: Normal rate and regular rhythm.     Pulses: Normal pulses.  Pulmonary:     Effort: Pulmonary effort is normal. No respiratory distress.     Breath sounds: Normal breath sounds. No wheezing.  Abdominal:     General: There is no distension.     Palpations: Abdomen is soft. There is no mass.     Tenderness: There is no abdominal tenderness. There is no guarding or rebound.     Comments: No ttp  Musculoskeletal:        General: Normal range of motion.     Cervical back: Normal range of motion and neck supple.     Right lower leg: No edema.  Left lower leg: No edema.  Skin:    General: Skin is warm and dry.     Capillary Refill: Capillary refill takes less than 2 seconds.  Neurological:     Mental Status: He is alert and oriented to person, place, and time.     ED Results / Procedures / Treatments   Labs (all labs ordered are listed, but only abnormal results are displayed) Labs Reviewed  BASIC METABOLIC PANEL - Abnormal; Notable for the following components:      Result Value   Potassium 3.1 (*)    Creatinine, Ser 1.46 (*)    GFR, Estimated 59 (*)    All other components within normal limits  CBC - Abnormal; Notable for the following components:   WBC 12.4 (*)    Hemoglobin 12.6 (*)    HCT 38.6 (*)    MCV 69.3 (*)    MCH 22.6 (*)    RDW 15.8 (*)    Platelets 439 (*)    All other components within normal limits  HEPATIC FUNCTION PANEL  LIPASE, BLOOD  TROPONIN I (HIGH SENSITIVITY)  TROPONIN I (HIGH SENSITIVITY)    EKG EKG Interpretation  Date/Time:  Sunday Nov 02 2020 12:44:36 EDT Ventricular Rate:  68 PR Interval:  203 QRS Duration: 135 QT Interval:  473 QTC Calculation: 504 R Axis:   -68 Text Interpretation: Sinus rhythm Borderline prolonged PR interval Nonspecific IVCD with  LAD Confirmed by Dene Gentry [phone]) on 11/02/2020 1:04:21 PM   Radiology DG Chest 2 View  Result Date: 11/02/2020 CLINICAL DATA:  Chest pain EXAM: CHEST - 2 VIEW COMPARISON:  02/03/2015 FINDINGS: The lungs are clear without focal pneumonia, edema, pneumothorax or pleural effusion. The cardiopericardial silhouette is within normal limits for size. The visualized bony structures of the thorax show no acute abnormality. Telemetry leads overlie the chest. IMPRESSION: No active cardiopulmonary disease. Electronically Signed   By: Misty Stanley M.D.   On: 11/02/2020 14:14    Procedures Procedures   Medications Ordered in ED Medications  nitroGLYCERIN (NITROSTAT) SL tablet 0.4 mg (0.4 mg Sublingual Given 11/02/20 1234)  aspirin chewable tablet 324 mg (324 mg Oral Given 11/02/20 1358)    ED Course  I have reviewed the triage vital signs and the nursing notes.  Pertinent labs & imaging results that were available during my care of the patient were reviewed by me and considered in my medical decision making (see chart for details).    MDM Rules/Calculators/A&P                          Patient resenting for evaluation of chest pain.  On exam, patient peers nontoxic.  Pain is mostly subsided.  He has some risk factors for ACS, as such will obtain cardiac work-up in the ED.  Will obtain labs, chest x-ray, EKG.  Nitro and aspirin given.  Regarding patient's chronic cough, will obtain chest x-ray, however pulm exam is overall reassuring.  Consider bronchitis due to smoking versus heartburn versus allergies vs sinusitis.   Labs interpreted by me, overall reassuring.  Minimal hypokalemia, but otherwise electrolytes are stable.  Troponin negative. Will repeat as pain started today. cxr viewed and independently interpreted by me, no pna, pnx, effusion. Patient states pain is resolved.  He continues to be well-appearing.  Pt signed out to Judye Bos, MD for f/u on repeat trop. If negative, likely plan for  d/c.   Final Clinical Impression(s) / ED Diagnoses Final  diagnoses:  None    Rx / DC Orders ED Discharge Orders    None       Franchot Heidelberg, PA-C 11/02/20 1459    Valarie Merino, MD 11/03/20 [phone]

## 2020-11-02 NOTE — ED Provider Notes (Signed)
  Physical Exam  BP 115/75   Pulse 63   Temp 98.5 F (36.9 C) (Oral)   Resp 14   Ht 6\' 1"  (1.854 m)   Wt 136.1 kg   SpO2 96%   BMI 39.58 kg/m   Physical Exam  ED Course/Procedures   Clinical Course as of 11/02/20 1600  Sun Nov 02, 2020  1511 Handoff: 2nd trop pending chest pain. Central chest pressure. No further symptoms. Heart 4 no known cardiology. If second trop negative then plan for DC to OP cardiology referral after conversation with patient. [CC]    Clinical Course User Index [CC] Tretha Sciara, MD    Procedures  MDM  Care patient received from previous provider at 1500.  Patient is a 50 year old male present with chief complaint of chest pain.  He has a heart score 4.  Patient chest pain-free upon arrival to the emergency department.  He denies any new symptoms or persistent symptoms on reevaluation.  Patient was pending troponin at time of handoff.  Patient requested outpatient follow-up rather than inpatient evaluation which is reasonable given his resolution of symptoms. Discussed findings with patient who expressed understanding and expressed understanding of and importance of continued outpatient follow-up.  Patient stable for discharge at this time.      Tretha Sciara, MD 11/02/20 1030    Sherwood Gambler, MD 11/03/20 1056

## 2020-11-28 ENCOUNTER — Ambulatory Visit: Admitting: Psychiatry

## 2020-12-04 ENCOUNTER — Ambulatory Visit: Admitting: Psychiatry

## 2021-01-01 ENCOUNTER — Ambulatory Visit (INDEPENDENT_AMBULATORY_CARE_PROVIDER_SITE_OTHER): Admitting: Psychiatry

## 2021-01-01 ENCOUNTER — Other Ambulatory Visit: Payer: Self-pay

## 2021-01-01 DIAGNOSIS — F411 Generalized anxiety disorder: Secondary | ICD-10-CM

## 2021-01-01 NOTE — Progress Notes (Signed)
[facility] Counselor/Therapist Progress Note  Patient ID: Joseph Wilcox, MRN: [id number],    Date: 01/01/2021  Time Spent:  50 minutes  Treatment Type: Individual Therapy  Reported Symptoms: anxiety (improved); feels his motivation is lower  Mental Status Exam:  Appearance:   Casual     Behavior:  Appropriate, Sharing, and Lower motivation  Motor:  Normal  Speech/Language:   Clear and Coherent  Affect:  anxious  Mood:  anxious  Thought process:  goal directed  Thought content:    WNL  Sensory/Perceptual disturbances:    WNL  Orientation:  oriented to person, place, time/date, situation, day of week, month of year, year, and stated date of January 01, 2021  Attention:  Good  Concentration:  Good and Fair  Memory:  WNL  Fund of knowledge:   Good  Insight:    Good  Judgment:   Good  Impulse Control:  Good   Risk Assessment: Danger to Self:  No Self-injurious Behavior: No Danger to Others: No Duty to Warn:no Physical Aggression / Violence:No  Access to Firearms a concern: No  Gang Involvement:No   Subjective:  Patient reporting anxiety as his only symptom and it has decreased some. Mostly anxious in the mornings, before leaving for work. "After I get to work, the anxiety gets better as he is there for a while and settles in to his job. Some social anxiety also. 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 "But the anxiety can also be unpredictable at times."  Reviewed some of our work from the past, especially with some CBT and solution focused strategies for patient to be able to use as needed.  Overall his anxiety is better compared to previous years.  His attitude is really good and he does well in follow through on prescribed medications and goal-directed behaviors.     Interventions: Cognitive Behavioral Therapy, Solution-Oriented/Positive Psychology, and Ego-Supportive  Diagnosis:    ICD-10-CM   1. Generalized anxiety disorder  F41.1       Treatment Goal Plan:  Patient not signing tx plan on computer screen due to [place].   Treatment Goals:  Goals remain the same and progress in being updated below each session. We reviewed goals today and talked about progress noted below. Long Term Goal:  Develop healthy interpersonal relationships that lead to alleviation and help prevent relapse of depression. Strategy: Patient will intentionally seek to have more interpersonal relationships that  are healthy and support the alleviation of depression. Short Term Goal:  Identify and replace anxious/depressive thinking that leads to feeling more anxious/depressed and leads to anxious/depressive actions. Strategy: Patient will replace negative and self-defeating self-talk with more realistic and positive messages.    Plan:  Patient today showing good motivation and working with his treatment goals and using some CBT and solution focused strategies.  His mindset is becoming more positive and he works well in sessions and outside of sessions with strategies for better managing his anxiety.  Seems to be doing well at his job and working at an [employer].  Is not reflecting back as much on his past that was very difficult and seems more focused towards the present leaning into the future.  Is trying to get more motivated and eat healthier which is a work in progress.  Did encourage patient to get outside daily and walk for exercise, stay in the present and focus  on what he can change versus cannot, stay in contact with people that are supportive of him, set and keep healthy boundaries as needed with others, practice more positive self talk consistently, let his faith be a resource in his emotional health as well as spiritual, and recognize the strength he is showing as he works with goal-directed behaviors to stay focused on his goals and make progress to move forward in a more positive  direction of improved emotional health.   Goal review and progress/challenges noted with patient.  Next appointment within 3 to 4 weeks.   Shanon Ace, LCSW

## 2021-01-22 NOTE — Assessment & Plan Note (Signed)
Improved control now of chronic OSA on CPAP - Good adherence to CPAP nightly, tolerating - Continue current CPAP therapy, patient seems to be benefiting from therapy

## 2021-02-04 ENCOUNTER — Encounter: Payer: Self-pay | Admitting: Psychiatry

## 2021-02-04 ENCOUNTER — Ambulatory Visit (INDEPENDENT_AMBULATORY_CARE_PROVIDER_SITE_OTHER): Admitting: Psychiatry

## 2021-02-04 ENCOUNTER — Other Ambulatory Visit: Payer: Self-pay

## 2021-02-04 DIAGNOSIS — F331 Major depressive disorder, recurrent, moderate: Secondary | ICD-10-CM | POA: Diagnosis not present

## 2021-02-04 DIAGNOSIS — F411 Generalized anxiety disorder: Secondary | ICD-10-CM

## 2021-02-04 MED ORDER — SERTRALINE HCL 100 MG PO TABS: 200.0000 mg | ORAL_TABLET | Freq: Every day | ORAL | 1 refills | Status: DC

## 2021-02-04 NOTE — Progress Notes (Signed)
ZABIAN TRUCKS [id number] 08/17/1970 50 y.o.  Subjective:   Patient ID:  Joseph Wilcox is a 50 y.o. (DOB April 03, 1971) male.  Chief Complaint:  Chief Complaint  Patient presents with   Anxiety   Follow-up    H/o depression and insomnia    HPI LONN LUGINBILL presents to the office today for follow-up of anxiety, depression, and insomnia. Abilify was stopped at last visit on 09/30/20. He reports rare depressed moods that lasts 1 day and does not significantly interfere with function. He reports that mood has been ok overall. Denies any irritability. He reports occasional anxiety. 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 Denies any physical s/s with anxiety. Denies panic. Some worry and rumination. He reports that his sleep has been good and is wearing cPap. Denies excessive daytime somnolence. Energy and motivation have been ok. Concentration is adequate and takes breaks with concentration. Denies SI.   He has been seeing Rinaldo Cloud, LCSW for therapy. He reports that his work is going ok and works 20 hours in the summer. He reports things are going well at home and with family.   Has not needed Temazepam prn recently.   Past Psychiatric Medication Trials: Duloxetine- Was taking 60 mg. Helpful for both anxiety and depression. Denies any long-term side effects. Initially had a "tickling, fluttering" sensation in chest.  Sertraline Abilify- Was taking 2 mg po qd. Gabapentin- Being prescribed for sciatic nerve pain. Has not noticed any effect on anxiety Ritalin- Took in childhood for ADD. Temazepam- Effective Trazodone- Nightmares  AIMS    Flowsheet Row Office Visit from 06/15/2019 in [place] [facility] Office Visit from 05/04/2019 in [facility] Total Score 0 0      GAD-7    [place] Office Visit from 06/20/2020 in [facility] Office Visit from 04/30/2020 in [facility] Office Visit from 12/01/2018 in [facility] Office Visit from 04/06/2018 in [facility] Office Visit from 10/24/2017 in [facility]  Total GAD-7 Score 10 5 0 0 0      PHQ2-9    [place] Office Visit from 06/20/2020 in [facility] Visit from 04/30/2020 in [facility] Office Visit from 01/29/2020 in [facility] Office Visit from 05/02/2019 in [facility] Office Visit from 03/30/2019 in [facility]  PHQ-2 Total Score '4 2 2 1 3  '$ PHQ-9 Total Score '19 8 4 2 14      '$ Flowsheet Row ED from 11/02/2020 in [place] [facility] No Risk        Review of Systems:  Review of Systems  Musculoskeletal:  Negative for gait problem.       Shoulder and foot pain  Neurological:  Negative for headaches.  Psychiatric/Behavioral:         Please refer to HPI   Medications: I have reviewed the patient's current medications.  Current Outpatient Medications  Medication Sig Dispense Refill   amLODipine (NORVASC) 10 MG tablet Take 1 tablet (10 mg total) by mouth daily. 90 tablet 3   calcium carbonate (TUMS EX) 750 MG chewable tablet Chew 1-2 tablets by mouth as needed for heartburn.     EDARBYCLOR 40-25 MG TABS Take 1 tablet by mouth daily. 90 tablet 3   fluticasone (FLONASE) 50 MCG/ACT nasal spray Place 2 sprays into  both nostrils daily. Use for 4-6 weeks then stop and use seasonally or as needed. (Patient taking differently: Place 2 sprays into both nostrils daily as needed for rhinitis or allergies (or seasonal allergies).) 16 g 3   pravastatin (PRAVACHOL) 20 MG tablet Take 1 tablet (20 mg total) by mouth daily. 90 tablet 3   Testosterone (FORTESTA) 10 MG/ACT (2%) GEL Apply 2 pumps to each thigh region daily (Patient taking differently: Apply 2 Pump topically See admin instructions. Apply 2 pumps to each thigh  region daily) 60 g 2   albuterol (VENTOLIN HFA) 108 (90 Base) MCG/ACT inhaler Inhale 1-2 puffs into the lungs 2 (two) times daily as needed (cough). 1 each 0   ibuprofen (ADVIL) 200 MG tablet Take 200-400 mg by mouth every 6 (six) hours as needed for mild pain (or headaches).     sertraline (ZOLOFT) 100 MG tablet Take 2 tablets (200 mg total) by mouth daily. 180 tablet 1   temazepam (RESTORIL) 15 MG capsule Take 1 capsule (15 mg total) by mouth at bedtime as needed for sleep. 30 capsule 2   No current facility-administered medications for this visit.    Medication Side Effects: None  Allergies: No Known Allergies  Past Medical History:  Diagnosis Date   Allergy    Anxiety    Depression    GERD (gastroesophageal reflux disease)    High cholesterol    Hx of migraines     Past Medical History, Surgical history, Social history, and Family history were reviewed and updated as appropriate.   Please see review of systems for further details on the patient's review from today.   Objective:   Physical Exam:  There were no vitals taken for this visit.  Physical Exam Constitutional:      General: He is not in acute distress. Musculoskeletal:        General: No deformity.  Neurological:     Mental Status: He is alert and oriented to person, place, and time.     Coordination: Coordination normal.  Psychiatric:        Attention and Perception: Attention and perception normal. He does not perceive auditory or visual hallucinations.        Mood and Affect: Mood is not depressed. Affect is not labile, blunt, angry or inappropriate.        Speech: Speech normal.        Behavior: Behavior normal.        Thought Content: Thought content normal. Thought content is not paranoid or delusional. Thought content does not include homicidal or suicidal ideation. Thought content does not include homicidal or suicidal plan.        Cognition and Memory: Cognition and memory normal.        Judgment:  Judgment normal.     Comments: Insight intact Mood is mildly anxious    Lab Review:     Component Value Date/Time   NA 138 11/02/2020 1152   NA 141 09/19/2020 0813   K 3.1 (L) 11/02/2020 1152   CL 99 11/02/2020 1152   CO2 29 11/02/2020 1152   GLUCOSE 86 11/02/2020 1152   BUN 10 11/02/2020 1152   BUN 12 09/19/2020 0813   CREATININE 1.46 (H) 11/02/2020 1152   CREATININE 1.32 04/30/2020 0835   CALCIUM 9.4 11/02/2020 1152   PROT 6.7 11/02/2020 1228   ALBUMIN 3.8 11/02/2020 1228   AST 24 11/02/2020 1228   ALT 23 11/02/2020 1228   ALKPHOS 68 11/02/2020 1228  BILITOT 0.9 11/02/2020 1228   GFRNONAA 59 (L) 11/02/2020 1152   GFRNONAA 64 04/30/2020 0835   GFRAA 74 04/30/2020 0835       Component Value Date/Time   WBC 12.4 (H) 11/02/2020 1152   RBC 5.57 11/02/2020 1152   HGB 12.6 (L) 11/02/2020 1152   HGB 12.6 (L) 02/09/2019 0808   HCT 38.6 (L) 11/02/2020 1152   HCT 39.0 02/09/2019 0808   PLT 439 (H) 11/02/2020 1152   PLT 386 02/09/2019 0808   MCV 69.3 (L) 11/02/2020 1152   MCV 69 (L) 02/09/2019 0808   MCH 22.6 (L) 11/02/2020 1152   MCHC 32.6 11/02/2020 1152   RDW 15.8 (H) 11/02/2020 1152   RDW 17.5 (H) 02/09/2019 0808   LYMPHSABS 2,353 04/30/2020 0835   LYMPHSABS 3.1 02/09/2019 0808   EOSABS 121 04/30/2020 0835   EOSABS 0.1 02/09/2019 0808   BASOSABS 28 04/30/2020 0835   BASOSABS 0.0 02/09/2019 0808    No results found for: POCLITH, LITHIUM   No results found for: PHENYTOIN, PHENOBARB, VALPROATE, CBMZ   .res Assessment: Plan:   Pt seen for 30 minutes and time spent discussing potential benefits, risks, and side effects of increasing Sertraline to 200 mg po qd to target residual mild anxiety s/s since anxiety s/s have improved with lower doses of Sertraline. He agrees to increase in Sertraline to 200 mg po qd.  He has not needed Temazepam recently, however will keep Temazepam in medication list since he would like to have it available if insomnia returns.  Recommend  continuing therapy with Rinaldo Cloud, LCSW.  Pt to follow-up with this provider in 3 months or sooner if clinically indicated.  Patient advised to contact office with any questions, adverse effects, or acute worsening in signs and symptoms.  Renel was seen today for anxiety and follow-up.  Diagnoses and all orders for this visit:  Generalized anxiety disorder -     sertraline (ZOLOFT) 100 MG tablet; Take 2 tablets (200 mg total) by mouth daily.  MDD (major depressive disorder), recurrent episode, moderate (HCC) -     sertraline (ZOLOFT) 100 MG tablet; Take 2 tablets (200 mg total) by mouth daily.    Please see After Visit Summary for patient specific instructions.  Future Appointments  Date Time Provider [place]  02/12/2021  4:00 PM Shanon Ace, LCSW [facility] None  05/07/2021  3:30 PM Thayer Headings, PMHNP [facility] None    No orders of the defined types were placed in this encounter.   -------------------------------

## 2021-02-12 ENCOUNTER — Ambulatory Visit: Admitting: Psychiatry

## 2021-02-24 ENCOUNTER — Other Ambulatory Visit: Payer: Self-pay | Admitting: Family Medicine

## 2021-02-24 DIAGNOSIS — I10 Essential (primary) hypertension: Secondary | ICD-10-CM

## 2021-02-24 NOTE — Telephone Encounter (Signed)
Requested medication (s) are due for refill today: yes  Requested medication (s) are on the active medication list:yes   Last refill:  11/26/2020  Future visit scheduled: no  Notes to clinic:  overdue for follow up per last note   Failed protocol: in normal range and within 180 days   Cr in normal range and within 180 days    Requested Prescriptions  Pending Prescriptions Disp Refills   EDARBYCLOR 40-25 MG TABS [Pharmacy Med Name: EDARBYCLOR 40/'25MG'$  TABLETS] 90 tablet 3    Sig: Take 1 tablet by mouth daily.     Cardiovascular: ARB + Diuretic Combos Failed - 02/24/2021  7:13 AM      Failed - K in normal range and within 180 days    Potassium  Date Value Ref Range Status  11/02/2020 3.1 (L) 3.5 - 5.1 mmol/L Final          Failed - Cr in normal range and within 180 days    Creat  Date Value Ref Range Status  04/30/2020 1.32 0.60 - 1.35 mg/dL Final   Creatinine, Ser  Date Value Ref Range Status  11/02/2020 1.46 (H) 0.61 - 1.24 mg/dL Final          Passed - Na in normal range and within 180 days    Sodium  Date Value Ref Range Status  11/02/2020 138 135 - 145 mmol/L Final  09/19/2020 141 134 - 144 mmol/L Final          Passed - Ca in normal range and within 180 days    Calcium  Date Value Ref Range Status  11/02/2020 9.4 8.9 - 10.3 mg/dL Final          Passed - Patient is not pregnant      Passed - Last BP in normal range    BP Readings from Last 1 Encounters:  11/02/20 125/70          Passed - Valid encounter within last 6 months    Recent Outpatient Visits           5 months ago Low testosterone in male   [place], DO   10 months ago Annual physical exam   [facility] Olin Hauser, DO   1 year ago Essential hypertension   [place], DO   1 year ago Annual physical exam   [place], DO   1  year ago Sciatica of right side without back pain   [place], Devonne Doughty, DO

## 2021-03-23 ENCOUNTER — Ambulatory Visit: Admitting: Psychiatry

## 2021-03-25 ENCOUNTER — Other Ambulatory Visit: Payer: Self-pay

## 2021-03-25 ENCOUNTER — Ambulatory Visit (INDEPENDENT_AMBULATORY_CARE_PROVIDER_SITE_OTHER): Admitting: Psychiatry

## 2021-03-25 DIAGNOSIS — F411 Generalized anxiety disorder: Secondary | ICD-10-CM | POA: Diagnosis not present

## 2021-03-25 NOTE — Progress Notes (Signed)
[facility] Counselor/Therapist Progress Note  Patient ID: Joseph Wilcox, MRN: [id number],    Date: 03/25/2021  Time Spent: 50 minutes   Treatment Type: Individual Therapy  Reported Symptoms: anxiety, some depression (but not daily)  Mental Status Exam:  Appearance:   Neat     Behavior:  Appropriate, Sharing, and Motivated  Motor:  Normal  Speech/Language:   Clear and Coherent  Affect:  anxious  Mood:  anxious  Thought process:  goal directed  Thought content:    WNL  Sensory/Perceptual disturbances:    WNL  Orientation:  oriented to person, place, time/date, situation, day of week, month of year, year, and stated date of Sept 21, 2022  Attention:  Good  Concentration:  Fair  Memory:  [place] of knowledge:   Good  Insight:    Good  Judgment:   Good  Impulse Control:  Good and Fair   Risk Assessment: Danger to Self:  No Self-injurious Behavior: No Danger to Others: No Duty to Warn:no Physical Aggression / Violence:No  Access to Firearms a concern: No  Gang Involvement:No   Subjective:  Patient in today reporting anxiety and "a little bit of depression". 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 "Home is my peaceful place, and so is church." Worked in session today on his anxiety and depression, looking at what helps and what does not help his anxious/depressed thoughts/feelings and wanting to increase the behaviors that lead more to  positive thoughts/feelings. Shares that he has decreased some of his negative feelings and what has helped me is to change the music he listens too, the online influences that he chooses as the "wanted to clean his life up some and be happier." Rates himself a "4/5 on a 1-10 scale of anxiety, and a 3 on a 1-10 scale for depression" which reflects patient progress.  Reflected with patient on his progress and he is sensing it also.  Encouraged to continue with previously discussed CBT and solution focused  strategies as well as maintaining his healthy boundaries and using more consistent positive self talk, as he moves forward in continuing work on his goals and feeling courage to about progress thus far.  Interventions: Solution-Oriented/Positive Psychology, Ego-Supportive, and Insight-Oriented  Diagnosis:   ICD-10-CM   1. Generalized anxiety disorder  F41.1       Treatment Goal Plan:  Patient not signing tx plan on computer screen due to [place].   Treatment Goals:  Goals remain the same and progress in being updated below each session. We reviewed goals today and talked about progress noted below. Long Term Goal:  Develop healthy interpersonal relationships that lead to alleviation and help prevent relapse of depression. Strategy: Patient will intentionally seek to have more interpersonal relationships that  are healthy and support the alleviation of depression. Short Term Goal:  Identify and replace anxious/depressive thinking that leads to feeling more anxious/depressed and leads to anxious/depressive actions. Strategy: Patient will replace negative and self-defeating self-talk with more realistic and positive messages.    Plan:  Patient today showing good motivation and commitment to his treatment goals as he is able to realize progress he is making.  Worked today with some of his negative/anxious thoughts and feelings and being able to confront them, challenge and change them to be more reality based and empowering thoughts and feelings as he pays more attention to the positive behaviors and thoughts that lead him away from anxiety  and negativity and more towards a peaceful and positive outlook.  Setting appropriate boundaries, the patient adds, has proven to be very helpful for him and his wife as their relationship is more positive also.  Encouraged patient in positive behaviors including: Staying in contact with people that are supportive of him, get outside daily and walk for  exercise, stay in the present and focus on what he can change or control, set and keep healthy boundaries as needed with others, consistent positive self talk, saying no when he needs to say no, believing in himself more, letting his faith be a resource in his emotional health as well as spiritual, and recognize the strength he is showing as he works with goal-directed behaviors to move in a direction of improved emotional health.  Goal review and progress/challenges noted with patient.  Next appointment within approximately 3-4 weeks.   Shanon Ace, LCSW

## 2021-03-26 ENCOUNTER — Other Ambulatory Visit: Payer: Self-pay | Admitting: Internal Medicine

## 2021-03-26 DIAGNOSIS — I10 Essential (primary) hypertension: Secondary | ICD-10-CM

## 2021-03-26 NOTE — Telephone Encounter (Signed)
Requested medication (s) are due for refill today: Yes  Requested medication (s) are on the active medication list: Yes  Last refill:  02/24/21  Future visit scheduled: No  Notes to clinic:  Protocol indicates pt. Needs lab work.    Requested Prescriptions  Pending Prescriptions Disp Refills   EDARBYCLOR 40-25 MG TABS [Pharmacy Med Name: EDARBYCLOR 40/25MG  TABLETS] 30 tablet 0    Sig: TAKE 1 TABLET BY MOUTH DAILY     Cardiovascular: ARB + Diuretic Combos Failed - 03/26/2021  7:14 AM      Failed - K in normal range and within 180 days    Potassium  Date Value Ref Range Status  11/02/2020 3.1 (L) 3.5 - 5.1 mmol/L Final          Failed - Cr in normal range and within 180 days    Creat  Date Value Ref Range Status  04/30/2020 1.32 0.60 - 1.35 mg/dL Final   Creatinine, Ser  Date Value Ref Range Status  11/02/2020 1.46 (H) 0.61 - 1.24 mg/dL Final          Failed - Valid encounter within last 6 months    Recent Outpatient Visits           6 months ago Low testosterone in male   [place], DO   11 months ago Annual physical exam   [facility] Olin Hauser, DO   1 year ago Essential hypertension   [place], DO   1 year ago Annual physical exam   [place], DO   1 year ago Sciatica of right side without back pain   [facility] Olin Hauser, DO              Passed - Na in normal range and within 180 days    Sodium  Date Value Ref Range Status  11/02/2020 138 135 - 145 mmol/L Final  09/19/2020 141 134 - 144 mmol/L Final          Passed - Ca in normal range and within 180 days    Calcium  Date Value Ref Range Status  11/02/2020 9.4 8.9 - 10.3 mg/dL Final          Passed - Patient is not pregnant      Passed - Last BP in normal range    BP Readings from Last 1 Encounters:   11/02/20 125/70

## 2021-04-27 ENCOUNTER — Encounter: Payer: Self-pay | Admitting: Family Medicine

## 2021-04-27 ENCOUNTER — Other Ambulatory Visit: Payer: Self-pay

## 2021-04-27 ENCOUNTER — Ambulatory Visit (INDEPENDENT_AMBULATORY_CARE_PROVIDER_SITE_OTHER): Admitting: Family Medicine

## 2021-04-27 VITALS — BP 148/90 | HR 86 | Ht 73.0 in | Wt 294.2 lb

## 2021-04-27 DIAGNOSIS — G4733 Obstructive sleep apnea (adult) (pediatric): Secondary | ICD-10-CM | POA: Diagnosis not present

## 2021-04-27 DIAGNOSIS — I10 Essential (primary) hypertension: Secondary | ICD-10-CM

## 2021-04-27 DIAGNOSIS — Z23 Encounter for immunization: Secondary | ICD-10-CM | POA: Diagnosis not present

## 2021-04-27 DIAGNOSIS — E782 Mixed hyperlipidemia: Secondary | ICD-10-CM | POA: Diagnosis not present

## 2021-04-27 DIAGNOSIS — K219 Gastro-esophageal reflux disease without esophagitis: Secondary | ICD-10-CM

## 2021-04-27 DIAGNOSIS — R0789 Other chest pain: Secondary | ICD-10-CM

## 2021-04-27 DIAGNOSIS — Z9989 Dependence on other enabling machines and devices: Secondary | ICD-10-CM

## 2021-04-27 MED ORDER — EDARBYCLOR 40-25 MG PO TABS: 1.0000 | ORAL_TABLET | Freq: Every day | ORAL | 3 refills | Status: DC

## 2021-04-27 MED ORDER — OMEPRAZOLE 40 MG PO CPDR: 40.0000 mg | DELAYED_RELEASE_CAPSULE | Freq: Every day | ORAL | 3 refills | Status: DC

## 2021-04-27 MED ORDER — PRAVASTATIN SODIUM 20 MG PO TABS: 20.0000 mg | ORAL_TABLET | Freq: Every day | ORAL | 3 refills | Status: DC

## 2021-04-27 NOTE — Progress Notes (Signed)
Subjective:    Patient ID: Joseph Wilcox, male    DOB: 22-May-1971, 50 y.o.   MRN: [id number]  Joseph Wilcox is a 50 y.o. male presenting on 04/27/2021 for Cough and Gastroesophageal Reflux   HPI  GERD Chronic throat clearing vs cough Last visit 6 month ago, started options with Flonase and GERD therapy PPI due to Likely cause with sinus drainage / post nasal drip and discussed this can cause or provoke episodic cough - He improved on stomach acid meds, needs new rx order has been off now - He has not endorsed productive cough or dyspnea - He says wife was concerned about his breathing asked about breathing testing or pulm  11/02/20 ED visit atypical chest pain He said he was on the way to church, he felt sudden real bad heartburn, and he had vomiting episode he went to ED, they did work up with chest   OSA, on CPAP - Patient reports prior history of dx OSA and on CPAP - Today reports that sleep apnea is well controlled. He uses the CPAP machine every night. Tolerates the machine well, and thinks that sleeps much better with it and feels good. No new concerns or symptoms.  CHRONIC HTN: Improved control. Limited home readings llately Current Meds - Edarbyclor 40-25mg  daily, Amlodipine 10mg  Reports good compliance, took meds today. Tolerating well, w/o complaints. Denies CP, dyspnea, HA, edema  Health Maintenance: Due for Flu Shot, will receive today    Depression screen [facility] 2/9 04/30/2020 01/29/2020 05/02/2019  Decreased Interest 1 1 1   Down, Depressed, Hopeless 1 1 0  PHQ - 2 Score 2 2 1   Altered sleeping 1 0 0  Tired, decreased energy 1 1 1   Change in appetite 1 1 0  Feeling bad or failure about yourself  1 0 0  Trouble concentrating 1 0 0  Moving slowly or fidgety/restless 1 0 0  Suicidal thoughts 0 0 0  PHQ-9 Score 8 4 2   Difficult doing work/chores Not difficult at all Not difficult at all Not difficult at all  Some encounter information is confidential and  restricted. Go to Review Flowsheets activity to see all data.  Some recent data might be hidden    Social History   Tobacco Use   Smoking status: Former    Types: Cigarettes    Quit date: 07/05/2010    Years since quitting: 10.8   Smokeless tobacco: Former  Scientific laboratory technician Use: Never used  Substance Use Topics   Alcohol use: Yes    Alcohol/week: 0.0 standard drinks    Comment: Occasional    Drug use: No    Family History  Problem Relation Age of Onset   Diabetes Mother    Heart disease Maternal Aunt    Heart attack Paternal Uncle    Prostate cancer Neg Hx    Colon cancer Neg Hx      Review of Systems Per HPI unless specifically indicated above     Objective:    BP (!) 148/90 (BP Location: Left Arm, Cuff Size: Large)   Pulse 86   Ht 6\' 1"  (1.854 m)   Wt 294 lb 3.2 oz (133.4 kg)   SpO2 98%   BMI 38.82 kg/m   Wt Readings from Last 3 Encounters:  04/27/21 294 lb 3.2 oz (133.4 kg)  11/02/20 300 lb (136.1 kg)  10/01/20 300 lb (136.1 kg)    Physical Exam Vitals and nursing note reviewed.  Constitutional:  General: He is not in acute distress.    Appearance: He is well-developed. He is not diaphoretic.     Comments: Well-appearing, comfortable, cooperative  HENT:     Head: Normocephalic and atraumatic.  Eyes:     General:        Right eye: No discharge.        Left eye: No discharge.     Conjunctiva/sclera: Conjunctivae normal.  Neck:     Thyroid: No thyromegaly.  Cardiovascular:     Rate and Rhythm: Normal rate and regular rhythm.     Pulses: Normal pulses.     Heart sounds: Normal heart sounds. No murmur heard. Pulmonary:     Effort: Pulmonary effort is normal. No respiratory distress.     Breath sounds: Normal breath sounds. No wheezing or rales.  Musculoskeletal:        General: Normal range of motion.     Cervical back: Normal range of motion and neck supple.  Lymphadenopathy:     Cervical: No cervical adenopathy.  Skin:    General: Skin  is warm and dry.     Findings: No erythema or rash.  Neurological:     Mental Status: He is alert and oriented to person, place, and time. Mental status is at baseline.  Psychiatric:        Behavior: Behavior normal.     Comments: Well groomed, good eye contact, normal speech and thoughts   Results for orders placed or performed during the hospital encounter of 17/00/17  Basic metabolic panel  Result Value Ref Range   Sodium 138 135 - 145 mmol/L   Potassium 3.1 (L) 3.5 - 5.1 mmol/L   Chloride 99 98 - 111 mmol/L   CO2 29 22 - 32 mmol/L   Glucose, Bld 86 70 - 99 mg/dL   BUN 10 6 - 20 mg/dL   Creatinine, Ser 1.46 (H) 0.61 - 1.24 mg/dL   Calcium 9.4 8.9 - 10.3 mg/dL   GFR, Estimated 59 (L) >60 mL/min   Anion gap 10 5 - 15  CBC  Result Value Ref Range   WBC 12.4 (H) 4.0 - 10.5 K/uL   RBC 5.57 4.22 - 5.81 MIL/uL   Hemoglobin 12.6 (L) 13.0 - 17.0 g/dL   HCT 38.6 (L) 39.0 - 52.0 %   MCV 69.3 (L) 80.0 - 100.0 fL   MCH 22.6 (L) 26.0 - 34.0 pg   MCHC 32.6 30.0 - 36.0 g/dL   RDW 15.8 (H) 11.5 - 15.5 %   Platelets 439 (H) 150 - 400 K/uL   nRBC 0.0 0.0 - 0.2 %  Hepatic function panel  Result Value Ref Range   Total Protein 6.7 6.5 - 8.1 g/dL   Albumin 3.8 3.5 - 5.0 g/dL   AST 24 15 - 41 U/L   ALT 23 0 - 44 U/L   Alkaline Phosphatase 68 38 - 126 U/L   Total Bilirubin 0.9 0.3 - 1.2 mg/dL   Bilirubin, Direct <0.1 0.0 - 0.2 mg/dL   Indirect Bilirubin NOT CALCULATED 0.3 - 0.9 mg/dL  Lipase, blood  Result Value Ref Range   Lipase 31 11 - 51 U/L  Troponin I (High Sensitivity)  Result Value Ref Range   Troponin I (High Sensitivity) 5 <18 ng/L  Troponin I (High Sensitivity)  Result Value Ref Range   Troponin I (High Sensitivity) 4 <18 ng/L      Assessment & Plan:   Problem List Items Addressed This Visit  OSA on CPAP   Mixed hyperlipidemia   Relevant Medications   pravastatin (PRAVACHOL) 20 MG tablet   EDARBYCLOR 40-25 MG TABS   Other Relevant Orders   Ambulatory referral  to Cardiology   Gastroesophageal reflux disease without esophagitis - Primary   Relevant Medications   omeprazole (PRILOSEC) 40 MG capsule   Essential hypertension    Mild elevated BP. Improved on re-check but still elevated Home readings limited On CPAP for OSA    Plan:  1. Continue current Amlodipine 10mg  daily, Edarbyclor 40-25mg  daily 2. Encourage improved lifestyle - low sodium diet, regular exercise 3. Continue improving monitor BP outside office, bring readings to next visit, if persistently >140/90 or new symptoms notify office sooner  Referral to Cardiology as well for further input, before changing therapy.      Relevant Medications   pravastatin (PRAVACHOL) 20 MG tablet   EDARBYCLOR 40-25 MG TABS   Other Relevant Orders   Ambulatory referral to Cardiology   Other Visit Diagnoses     Atypical chest pain       Relevant Orders   Ambulatory referral to Cardiology   Needs flu shot       Relevant Orders   Flu Vaccine QUAD 66mo+IM (Fluarix, Fluzone & Alfiuria Quad PF) (Completed)       GERD Throat clearing vs cough - likely due to GERD uncontrolled No other respiratory symptoms at this time Start rx Omeprazole 40mg  daily  OSA on CPAP Controlled Benefits from therapy. Wearing nightly.  If symptoms still unresolved can consider Pulm / PFTs in future  Referral to Cardiology for consultation on history of atypical chest pain ED visit on 11/02/20, has some risk factors with HTN HLD male >40 and has fam history of heart disease.   Orders Placed This Encounter  Procedures   Flu Vaccine QUAD 42mo+IM (Fluarix, Fluzone & Alfiuria Quad PF)   Ambulatory referral to Cardiology    Referral Priority:   Routine    Referral Type:   Consultation    Referral Reason:   Specialty Services Required    Requested Specialty:   Cardiology    Number of Visits Requested:   1      Meds ordered this encounter  Medications   omeprazole (PRILOSEC) 40 MG capsule    Sig: Take 1 capsule  (40 mg total) by mouth daily before breakfast.    Dispense:  90 capsule    Refill:  3   pravastatin (PRAVACHOL) 20 MG tablet    Sig: Take 1 tablet (20 mg total) by mouth daily.    Dispense:  90 tablet    Refill:  3   EDARBYCLOR 40-25 MG TABS    Sig: Take 1 tablet by mouth daily.    Dispense:  90 tablet    Refill:  3    Add refills      Follow up plan: Return in about 6 weeks (around 06/08/2021) for 6 week Annual Physical AM apt fasting lab AFTER.   Nobie Putnam, [facility] 04/27/2021, 2:57 PM

## 2021-04-27 NOTE — Assessment & Plan Note (Signed)
Mild elevated BP. Improved on re-check but still elevated Home readings limited On CPAP for OSA    Plan:  1. Continue current Amlodipine 10mg  daily, Edarbyclor 40-25mg  daily 2. Encourage improved lifestyle - low sodium diet, regular exercise 3. Continue improving monitor BP outside office, bring readings to next visit, if persistently >140/90 or new symptoms notify office sooner  Referral to Cardiology as well for further input, before changing therapy.

## 2021-04-27 NOTE — Patient Instructions (Addendum)
Thank you for coming to the office today.  Referral to [facility] Cardiology - stay tuned for apt. Good idea to go ahead and do an evaluation to rule out any heart problem. They can also look at your blood pressure if still elevated, goal to keep track of it now until that apt, and bring readings for them. Refilled your medication today.  [facility]) [facility]Care at [address], [place] [address] Main: [phone]   Most likely the throat clearing  and cough symptoms are related to the stomach acid that is not adequately controlled, this is a very common scenario, I would recommend stronger rx medication Omeprazole 40mg  daily before 1st meal breakfast to help prevent this and resolving these symptoms. At this time based on my exam I am not convinced it is a breathing or lung problem. I reviewed your last Chest X-ray 11/02/20 from hospital and it showed normal report for lungs. We can reconsider a pulmonology consult in future and consider breathing tests if needed.  Testosterone topical refill should be through Urology  DUE for FASTING BLOOD WORK (no food or drink after midnight before the lab appointment, only water or coffee without cream/sugar on the morning of)   Please schedule a Follow-up Appointment to: Return in about 6 weeks (around 06/08/2021) for 6 week Annual Physical AM apt fasting lab AFTER.  If you have any other questions or concerns, please feel free to call the office or send a message through [place]. You may also schedule an earlier appointment if necessary.  Additionally, you may be receiving a survey about your experience at our office within a few days to 1 week by e-mail or mail. We value your feedback.  Nobie Putnam, DO [place]

## 2021-04-29 ENCOUNTER — Other Ambulatory Visit: Payer: Self-pay

## 2021-04-29 ENCOUNTER — Ambulatory Visit (INDEPENDENT_AMBULATORY_CARE_PROVIDER_SITE_OTHER): Admitting: Psychiatry

## 2021-04-29 DIAGNOSIS — F411 Generalized anxiety disorder: Secondary | ICD-10-CM

## 2021-04-29 NOTE — Progress Notes (Signed)
[facility] Counselor/Therapist Progress Note  Patient ID: ZAFAR DEBROSSE, MRN: [id number],    Date: 04/29/2021  Time Spent: 50 minutes   Treatment Type: Individual Therapy  Reported Symptoms:  anxiety "but really improving", occasional anger and trying to manage it better  Mental Status Exam:  Appearance:   Neat     Behavior:  Appropriate, Sharing, and Motivated  Motor:  Normal  Speech/Language:   Clear and Coherent  Affect:  Some anxiety  Mood:  anxious  Thought process:  normal  Thought content:    WNL  Sensory/Perceptual disturbances:    WNL  Orientation:  oriented to person, place, time/date, situation, day of week, month of year, year, and stated date of Oct. 26th, 2022  Attention:  Good  Concentration:  Good  Memory:  WNL  Fund of knowledge:   Good  Insight:    Good  Judgment:   Good  Impulse Control:  Good   Risk Assessment: Danger to Self:  No Self-injurious Behavior: No Danger to Others: No Duty to Warn:no Physical Aggression / Violence:No  Access to Firearms a concern: No  Gang Involvement:No   Subjective: Patient in today reporting anxiety and that he is definitely improving. Also noted some occasional anger and is trying to manage it better and be able to "let some things go when needed." Discusses his progress and feels good about it, and wife has given him positive feedback. Reports "depression is a lot better, anxiety is decreasing, impulsiveness is definitely decreased "and not as eager to prove someone wrong, getting better at letting things go rather than pursue a debate or argument." Anxiety tends to occur when "something happens out of my routine", being asked to do something at work that he is not used to doing, if I'm late leaving when I'm going somewhere, opening mail "due to the unknowns".  Processed these areas where her anxiety tends to surface more and looked at strategies that can help him approach certain task with more confidence and not  feel so anxious.  States at work he uses jazz music that helps keep him calm. Reports that things at home still going well.  Enjoys his involvement at church which helps him spiritually and emotionally.  Today on a 1-10 scale for anxiety, patient rates himself as a 3.  On a 1-10 scale for depression, he rates himself a 1, both reflecting significant improvement.   Interventions: Ego-Supportive and Insight-Oriented  Diagnosis:   ICD-10-CM   1. Generalized anxiety disorder  F41.1       Treatment Goal Plan:  Patient not signing tx plan on computer screen due to [place].   Treatment Goals:  Goals remain the same and progress in being updated below each session. We reviewed goals today and talked about progress noted below. Long Term Goal:  Develop healthy interpersonal relationships that lead to alleviation and help prevent relapse of depression. Strategy: Patient will intentionally seek to have more interpersonal relationships that  are healthy and support the alleviation of depression. Short Term Goal:  Identify and replace anxious/depressive thinking that leads to feeling more anxious/depressed and leads to anxious/depressive actions. Strategy: Patient will replace negative and self-defeating self-talk with more realistic and positive messages.    Plan: Patient today showing good motivation and participation in session. Reviewed treatment goal plan and his progress/need areas. Patient feeling positive about his progress especially with depression decreased significantly and is working on decreasing his anxiety and better manage it in healthy ways.  As noted above patient's level of anxiety and depression continues to decrease and this also seems to be helping his self confidence.  Encouraged patient in tingling to practice positive behaviors including having appropriate boundaries with others, staying in contact with people that are supportive of him, getting outside daily and walk, staying in  the present focusing on what he can change or control, set realistic expectations for himself, consistent positive self talk, saying no when he needs to say no, believing in himself more, letting his faith be a resource in his emotional health as well as spiritual and realize the strength he shows working with goal-directed behaviors to move in a direction that supports improved emotional health and overall stability.  Goal review and progress/challenges noted with patient.  Next appointment within approximately 1 month.  This record has been created using [employer].  Chart creation errors have been sought, but may not always have been located and corrected.  Such creation errors do not reflect on the standard of medical care provided.    Shanon Ace, LCSW

## 2021-05-07 ENCOUNTER — Ambulatory Visit: Admitting: Psychiatry

## 2021-06-01 ENCOUNTER — Ambulatory Visit (INDEPENDENT_AMBULATORY_CARE_PROVIDER_SITE_OTHER): Admitting: Internal Medicine

## 2021-06-01 ENCOUNTER — Encounter: Payer: Self-pay | Admitting: Internal Medicine

## 2021-06-01 ENCOUNTER — Other Ambulatory Visit: Payer: Self-pay

## 2021-06-01 VITALS — BP 140/72 | HR 85 | Ht 73.0 in | Wt 294.0 lb

## 2021-06-01 DIAGNOSIS — E782 Mixed hyperlipidemia: Secondary | ICD-10-CM | POA: Diagnosis not present

## 2021-06-01 DIAGNOSIS — R072 Precordial pain: Secondary | ICD-10-CM | POA: Diagnosis not present

## 2021-06-01 DIAGNOSIS — I10 Essential (primary) hypertension: Secondary | ICD-10-CM | POA: Diagnosis not present

## 2021-06-01 IMAGING — DX DG CHEST 2V
2 series · 2 of 2 positions shown · non-contrast
Comparison: 02/03/2015

CLINICAL DATA: Chest pain

EXAM:
CHEST - 2 VIEW

[chest pa]
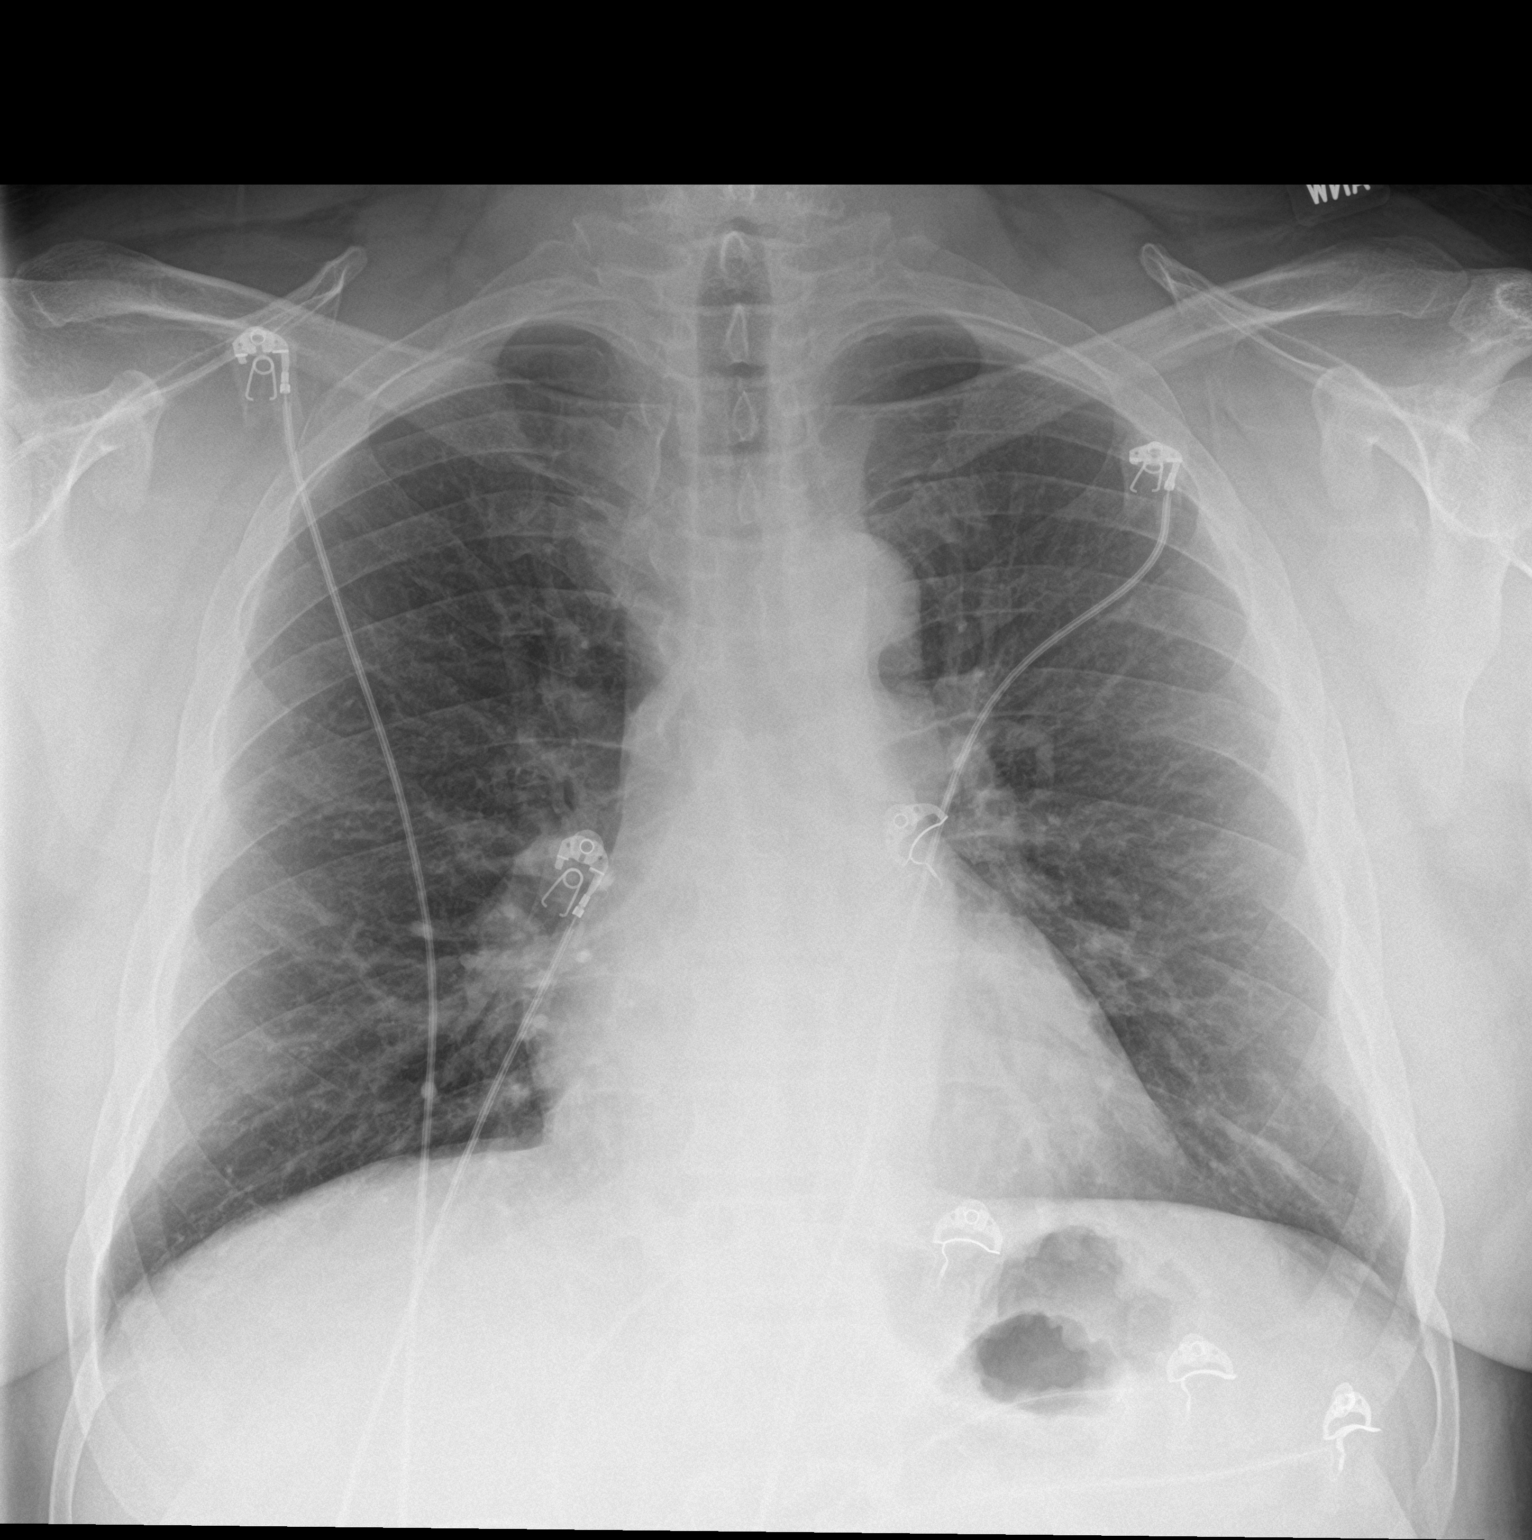

[chest lat]
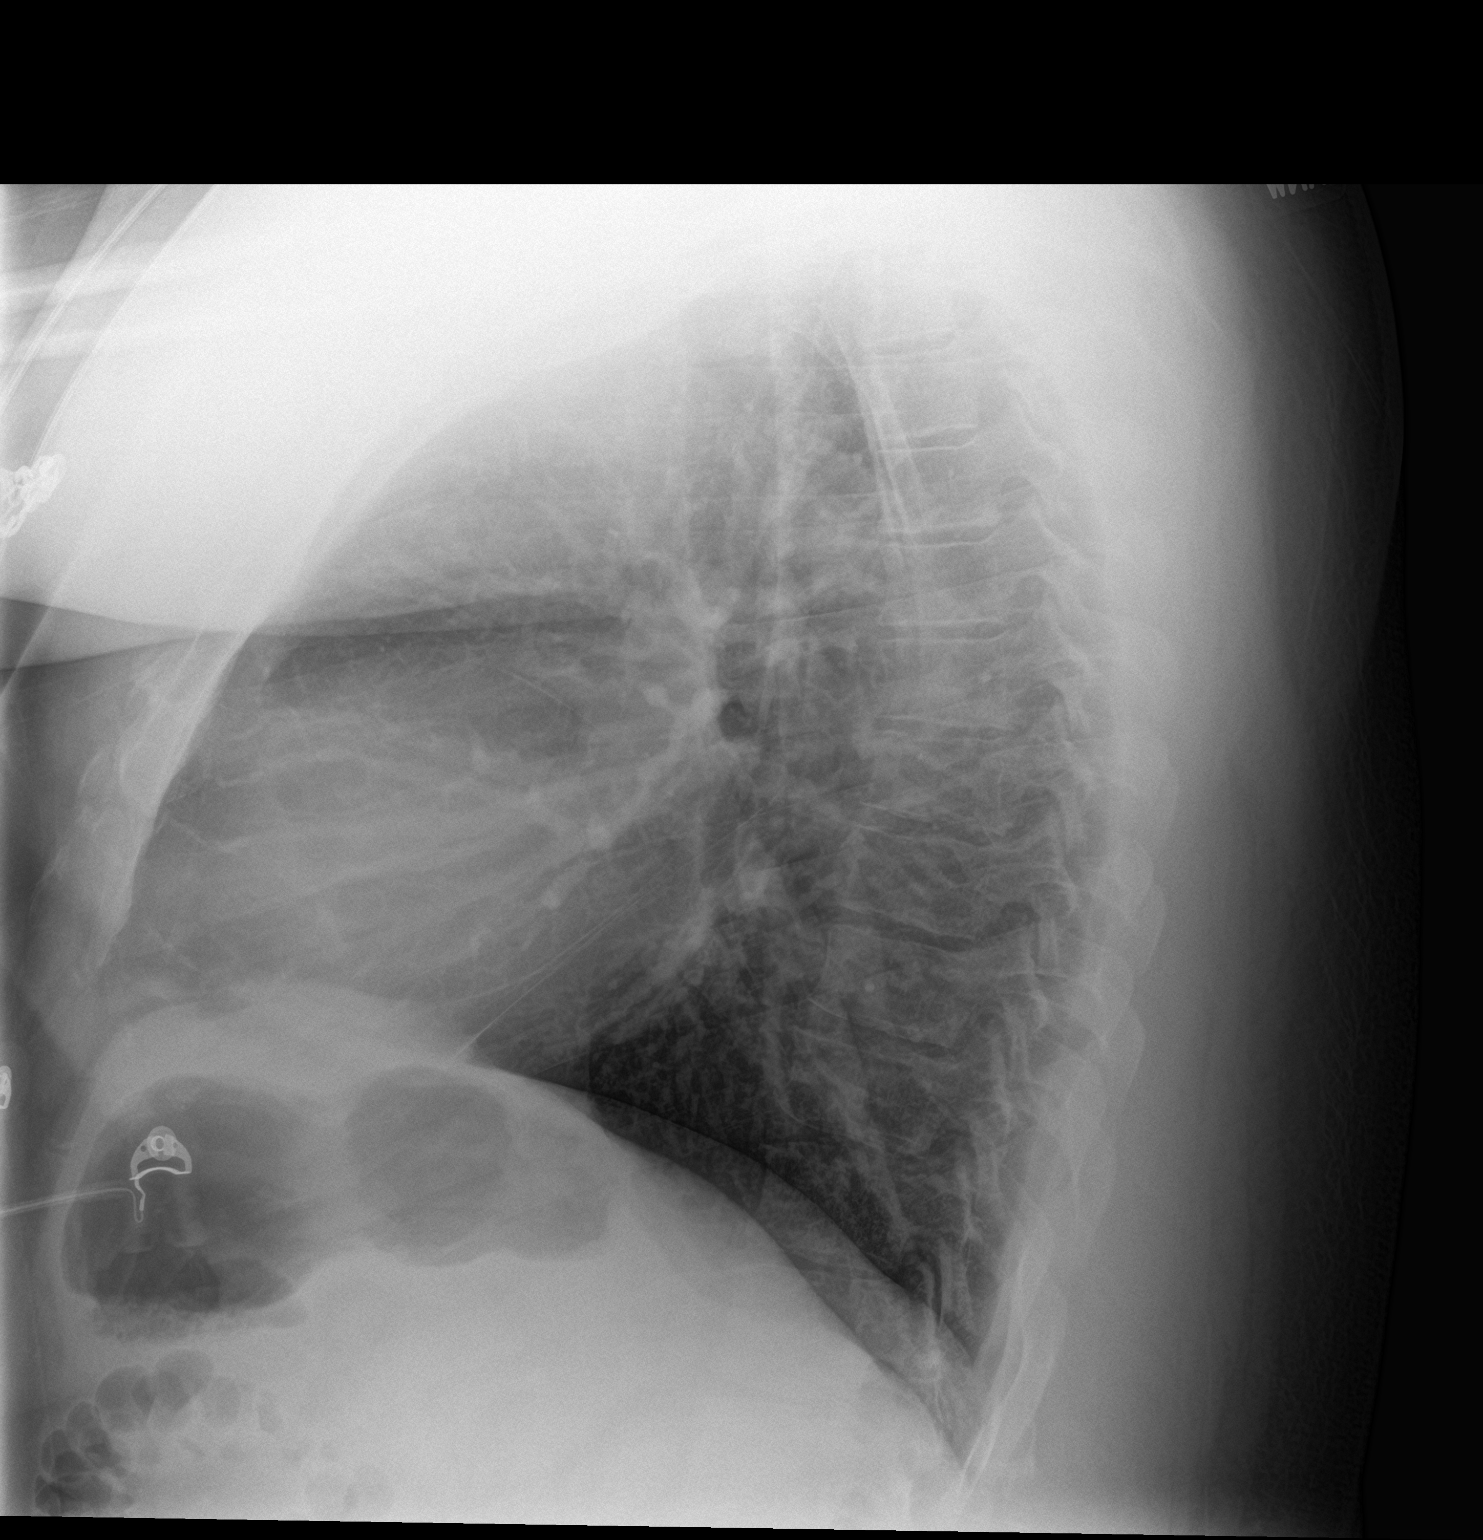

[2 of 2 positions shown; findings below may reference images not displayed]

FINDINGS: The lungs are clear without focal pneumonia, edema, pneumothorax or
pleural effusion. The cardiopericardial silhouette is within normal
limits for size. The visualized bony structures of the thorax show
no acute abnormality. Telemetry leads overlie the chest.
IMPRESSION: No active cardiopulmonary disease.

## 2021-06-01 MED ORDER — METOPROLOL TARTRATE 100 MG PO TABS: 100.0000 mg | ORAL_TABLET | Freq: Once | ORAL | 0 refills | Status: DC

## 2021-06-01 MED ORDER — ASPIRIN EC 81 MG PO TBEC: 81.0000 mg | DELAYED_RELEASE_TABLET | Freq: Every day | ORAL | Status: DC

## 2021-06-01 NOTE — Progress Notes (Signed)
New Outpatient Visit Date: 06/01/2021  Referring Provider: Olin Hauser, DO [address] [place],  [place] [address]  Chief Complaint: Chest pain  HPI:  [name] is a 50 y.o. male who is being seen today for the evaluation of chest pain and mixed hyperlipidemia at the request of Dr. Parks Ranger. He has a history of hypertension, hyperlipidemia, obstructive sleep apnea, GERD, migraine headaches, depression, and anxiety.  Joseph Wilcox reports a several year history of chest pain occurring about once a month that he has attributed to heartburn.  He had one particularly severe episode that occurred while driving to church in May.  He had to pull over and get an antacid from a convenience store and reports that it did not help.  He ultimately presented to the emergency department where work-up was unrevealing.  Shortly thereafter, he began taking omeprazole on a regular basis and has not had any further chest pain for the last couple of months.  His prior chest pain was not associated with any other symptoms.  There were no exacerbating factors.  He has not had any exertional symptoms.  He denies a history of heart disease or prior cardiac testing.  He has not had any shortness of breath  --------------------------------------------------------------------------------------------------  Cardiovascular History & Procedures: Cardiovascular Problems: Atypical chest pain  Risk Factors: Hypertension, hyperlipidemia, obesity, male gender, and prior tobacco use  Cath/PCI: None  CV Surgery: None  EP Procedures and Devices: None  Non-Invasive Evaluation(s): None  Recent CV Pertinent Labs: Lab Results  Component Value Date   CHOL 249 (H) 04/30/2020   HDL 46 04/30/2020   LDLCALC 166 (H) 04/30/2020   TRIG 206 (H) 04/30/2020   CHOLHDL 5.4 (H) 04/30/2020   K 3.1 (L) 11/02/2020   BUN 10 11/02/2020   BUN 12 09/19/2020   CREATININE 1.46 (H) 11/02/2020   CREATININE 1.32 04/30/2020     --------------------------------------------------------------------------------------------------  Past Medical History:  Diagnosis Date   Allergy    Anxiety    Depression    GERD (gastroesophageal reflux disease)    High cholesterol    Hx of migraines    Hypertension    Obstructive sleep apnea     Past Surgical History:  Procedure Laterality Date   COLONOSCOPY WITH PROPOFOL N/A 03/09/2019   Procedure: COLONOSCOPY WITH PROPOFOL;  Surgeon: [name], MD;  Location[facility]COPY;  Service: Gastroenterology;  Laterality: N/A;   ESOPHAGOGASTRODUODENOSCOPY (EGD) WITH PROPOFOL N/A 03/09/2019   Procedure: ESOPHAGOGASTRODUODENOSCOPY (EGD) WITH PROPOFOL;  Surgeon: [name], MD;  Location: [facility] ENDOSCOPY;  Service: Gastroenterology;  Laterality: N/A;   FOOT SURGERY  2002   Bone spur   TONSILLECTOMY      Current Meds  Medication Sig   albuterol (VENTOLIN HFA) 108 (90 Base) MCG/ACT inhaler Inhale 1-2 puffs into the lungs 2 (two) times daily as needed (cough).   amLODipine (NORVASC) 10 MG tablet Take 1 tablet (10 mg total) by mouth daily.   aspirin EC 81 MG tablet Take 1 tablet (81 mg total) by mouth daily. Swallow whole.   calcium carbonate (TUMS EX) 750 MG chewable tablet Chew 1-2 tablets by mouth as needed for heartburn.   EDARBYCLOR 40-25 MG TABS Take 1 tablet by mouth daily.   fluticasone (FLONASE) 50 MCG/ACT nasal spray Place 2 sprays into both nostrils daily. Use for 4-6 weeks then stop and use seasonally or as needed.   ibuprofen (ADVIL) 200 MG tablet Take 200-400 mg by mouth every 6 (six) hours as needed for mild  pain (or headaches).   metoprolol tartrate (LOPRESSOR) 100 MG tablet Take 1 tablet (100 mg total) by mouth once for 1 dose. Take TWO hours prior to CT procedure   omeprazole (PRILOSEC) 40 MG capsule Take 1 capsule (40 mg total) by mouth daily before breakfast.   pravastatin (PRAVACHOL) 20 MG tablet Take 1 tablet (20 mg total) by mouth daily.   sertraline (ZOLOFT)  100 MG tablet Take 2 tablets (200 mg total) by mouth daily.   temazepam (RESTORIL) 15 MG capsule Take 1 capsule (15 mg total) by mouth at bedtime as needed for sleep.    Allergies: Patient has no known allergies.  Social History   Tobacco Use   Smoking status: Former    Types: Cigarettes    Quit date: 07/05/2010    Years since quitting: 10.9   Smokeless tobacco: Former   Tobacco comments:    Smokes occasional cigar.  Vaping Use   Vaping Use: Never used  Substance Use Topics   Alcohol use: Yes    Alcohol/week: 1.0 standard drink    Types: 1 Standard drinks or equivalent per week    Comment: One drink/week   Drug use: No    Family History  Problem Relation Age of Onset   Diabetes Mother    Heart disease Maternal Aunt        Valve replacement; congenital heart disease   Heart attack Paternal Uncle    Prostate cancer Neg Hx    Colon cancer Neg Hx     Review of Systems: A 12-system review of systems was performed and was negative except as noted in the HPI.  --------------------------------------------------------------------------------------------------  Physical Exam: BP 140/72 (BP Location: Right Arm, Patient Position: Sitting, Cuff Size: Large)   Pulse 85   Ht 6\' 1"  (1.854 m)   Wt 294 lb (133.4 kg)   SpO2 98%   BMI 38.79 kg/m   General:  NAD HEENT: No conjunctival pallor or scleral icterus. Facemask in place. Neck: Supple without lymphadenopathy, thyromegaly, JVD, or HJR. No carotid bruit. Lungs: Normal work of breathing. Clear to auscultation bilaterally without wheezes or crackles. Heart: Regular rate and rhythm without murmurs, rubs, or gallops. Non-displaced PMI. Abd: Bowel sounds present. Soft, NT/ND without hepatosplenomegaly Ext: No lower extremity edema. Radial, PT, and DP pulses are 2+ bilaterally Skin: Warm and dry without rash. Neuro: CNIII-XII intact. Strength and fine-touch sensation intact in upper and lower extremities bilaterally. Psych: Normal  mood and affect.  EKG:  Normal sinus rhythm with nonspecific T wave abnormality, new 11/02/2020.  Left axis deviation no longer present.  Lab Results  Component Value Date   WBC 12.4 (H) 11/02/2020   HGB 12.6 (L) 11/02/2020   HCT 38.6 (L) 11/02/2020   MCV 69.3 (L) 11/02/2020   PLT 439 (H) 11/02/2020    Lab Results  Component Value Date   NA 138 11/02/2020   K 3.1 (L) 11/02/2020   CL 99 11/02/2020   CO2 29 11/02/2020   BUN 10 11/02/2020   CREATININE 1.46 (H) 11/02/2020   GLUCOSE 86 11/02/2020   ALT 23 11/02/2020    Lab Results  Component Value Date   CHOL 249 (H) 04/30/2020   HDL 46 04/30/2020   LDLCALC 166 (H) 04/30/2020   TRIG 206 (H) 04/30/2020   CHOLHDL 5.4 (H) 04/30/2020    --------------------------------------------------------------------------------------------------  ASSESSMENT AND PLAN: Chest pain: [name] reports years of intermittent nonexertional chest pain that he has attributed to GERD, with a more severe episode unresponsive to  OTC antacids in May.  ED work-up at that time was unrevealing.  Discomfort has abated with regular use of omeprazole since then, suggesting a GI etiology.  However, his EKG today shows new nonspecific T wave changes in the anterior leads.  Coupled with his multiple cardiac risk factors including hypertension, hyperlipidemia, obesity, gender, and prior tobacco use, I think it would be prudent to exclude underlying CAD.  We have discussed further evaluation options and have agreed to proceed with a coronary CTA.  In the meantime, I have asked Mr. Khian to begin taking aspirin 81 mg daily.  Hypertension: Blood pressure borderline elevated today.  We will defer medication changes at this time.  Hyperlipidemia: Recent lipid panel notable for LDL of 166 and triglycerides of 206.  Currently, his 10-year ASCVD risk is 10.8%.  We will continue pravastatin though if there is evidence of ASCVD on coronary CTA, escalation to high intensity  therapy would be recommended.  Follow-up: Return to clinic in 1 month.  Nelva Bush, MD 06/01/2021 2:57 PM

## 2021-06-01 NOTE — Patient Instructions (Signed)
Medication Instructions:   Your physician has recommended you make the following change in your medication:   START Aspirin 81 mg daily - you may purchase over-the-counter  *If you need a refill on your cardiac medications before your next appointment, please call your pharmacy*   Lab Work:  BMET today  If you have labs (blood work) drawn today and your tests are completely normal, you will receive your results only by: [place] (if you have MyChart) OR A paper copy in the mail If you have any lab test that is abnormal or we need to change your treatment, we will call you to review the results.   Testing/Procedures:  Your cardiac CT will be scheduled at the below location:   [facility] [address] [place], [place] [address] [phone]  If scheduled at [facility], please arrive at the [facility] main entrance (entrance A) of [facility] 30 minutes prior to test start time. You can use the FREE valet parking offered at the main entrance (encouraged to control the heart rate for the test) Proceed to the [facility] Radiology Department (first floor) to check-in and test prep.  Please follow these instructions carefully (unless otherwise directed):  Hold all erectile dysfunction medications at least 3 days (72 hrs) prior to test.  On the Night Before the Test:  Be sure to Drink plenty of water. Do not consume any caffeinated/decaffeinated beverages or chocolate 12 hours prior to your test. Do not take any antihistamines 12 hours prior to your test.   On the Day of the Test:  Drink plenty of water until 1 hour prior to the test. Do not eat any food 4 hours prior to the test. You may take your regular medications prior to the test.  Take metoprolol (Lopressor) two hours prior to test.        After the Test: Drink plenty of water. After receiving IV contrast, you may experience a mild flushed feeling. This is normal. On  occasion, you may experience a mild rash up to 24 hours after the test. This is not dangerous. If this occurs, you can take Benadryl 25 mg and increase your fluid intake. If you experience trouble breathing, this can be serious. If it is severe call 911 IMMEDIATELY. If it is mild, please call our office. If you take any of these medications: Glipizide/Metformin, Avandament, Glucavance, please do not take 48 hours after completing test unless otherwise instructed.  Please allow 2-4 weeks for scheduling of routine cardiac CTs. Some insurance companies require a pre-authorization which may delay scheduling of this test.   For non-scheduling related questions, please contact the cardiac imaging nurse navigator should you have any questions/concerns: Marchia Bond, Cardiac Imaging Nurse Navigator Gordy Clement, Cardiac Imaging Nurse Navigator [facility] Direct Office Dial: [phone]   For scheduling needs, including cancellations and rescheduling, please call [place], [phone].   Follow-Up: At [facility], you and your health needs are our priority.  As part of our continuing mission to provide you with exceptional heart care, we have created designated Provider Care Teams.  These Care Teams include your primary Cardiologist (physician) and Advanced Practice Providers (APPs -  Physician Assistants and Nurse Practitioners) who all work together to provide you with the care you need, when you need it.  We recommend signing up for the patient portal called "MyChart".  Sign up information is provided on this After Visit Summary.  MyChart is used  to connect with patients for Virtual Visits (Telemedicine).  Patients are able to view lab/test results, encounter notes, upcoming appointments, etc.  Non-urgent messages can be sent to your provider as well.   To learn more about what you can do with MyChart, go to [email or web address].    Your next appointment:   1  month(s)  The format for your next appointment:   In Person  Provider:   You may see Dr. Harrell Gave End or one of the following Advanced Practice Providers on your designated Care Team:   Murray Hodgkins, NP Christell Faith, PA-C Cadence Kathlen Mody, [place]

## 2021-06-02 ENCOUNTER — Other Ambulatory Visit: Payer: Self-pay | Admitting: *Deleted

## 2021-06-02 DIAGNOSIS — E876 Hypokalemia: Secondary | ICD-10-CM

## 2021-06-02 LAB — BASIC METABOLIC PANEL
BUN/Creatinine Ratio: 9 (ref 9–20)
BUN: 10 mg/dL (ref 6–24)
CO2: 23 mmol/L (ref 20–29)
Calcium: 9.3 mg/dL (ref 8.7–10.2)
Chloride: 98 mmol/L (ref 96–106)
Creatinine, Ser: 1.11 mg/dL (ref 0.76–1.27)
Glucose: 115 mg/dL — ABNORMAL HIGH (ref 70–99)
Potassium: 2.9 mmol/L — ABNORMAL LOW (ref 3.5–5.2)
Sodium: 138 mmol/L (ref 134–144)
eGFR: 82 mL/min/{1.73_m2} (ref >59)

## 2021-06-02 MED ORDER — POTASSIUM CHLORIDE CRYS ER 20 MEQ PO TBCR: 20.0000 meq | EXTENDED_RELEASE_TABLET | Freq: Every day | ORAL | 3 refills | Status: AC

## 2021-06-08 ENCOUNTER — Other Ambulatory Visit (INDEPENDENT_AMBULATORY_CARE_PROVIDER_SITE_OTHER)

## 2021-06-08 ENCOUNTER — Other Ambulatory Visit: Payer: Self-pay

## 2021-06-08 DIAGNOSIS — E876 Hypokalemia: Secondary | ICD-10-CM | POA: Diagnosis not present

## 2021-06-09 LAB — BASIC METABOLIC PANEL
BUN/Creatinine Ratio: 7 — ABNORMAL LOW (ref 9–20)
BUN: 9 mg/dL (ref 6–24)
CO2: 26 mmol/L (ref 20–29)
Calcium: 9.4 mg/dL (ref 8.7–10.2)
Chloride: 102 mmol/L (ref 96–106)
Creatinine, Ser: 1.21 mg/dL (ref 0.76–1.27)
Glucose: 77 mg/dL (ref 70–99)
Potassium: 3.4 mmol/L — ABNORMAL LOW (ref 3.5–5.2)
Sodium: 142 mmol/L (ref 134–144)
eGFR: 74 mL/min/{1.73_m2} (ref >59)

## 2021-06-11 ENCOUNTER — Telehealth (HOSPITAL_COMMUNITY): Payer: Self-pay | Admitting: *Deleted

## 2021-06-11 NOTE — Telephone Encounter (Signed)
Attempted to call patient regarding upcoming cardiac CT appointment. °Left message on voicemail with name and callback number ° °Tashayla Therien RN Navigator Cardiac Imaging °[facility] °[phone] Office °[phone] Cell ° °

## 2021-06-11 NOTE — Telephone Encounter (Signed)
Patient returning call regarding upcoming cardiac imaging study; pt verbalizes understanding of appt date/time, parking situation and where to check in, pre-test NPO status and medications ordered, and verified current allergies; name and call back number provided for further questions should they arise  Gordy Clement RN Navigator Cardiac Imaging [facility] [phone] office [phone] cell  Patient to take 100mg  metoprolol tartrate two hours prior to cardiac CT scan. He is aware to arrive at 11:30am for his noon scan.

## 2021-06-12 ENCOUNTER — Ambulatory Visit (HOSPITAL_COMMUNITY)
Admission: RE | Admit: 2021-06-12 | Discharge: 2021-06-12 | Disposition: A | Discharge status: Home / Self Care | Payer: Self-pay | Source: Ambulatory Visit | Attending: Internal Medicine | Admitting: Internal Medicine

## 2021-06-12 ENCOUNTER — Other Ambulatory Visit: Payer: Self-pay

## 2021-06-12 ENCOUNTER — Encounter: Admitting: Family Medicine

## 2021-06-12 DIAGNOSIS — R072 Precordial pain: Secondary | ICD-10-CM | POA: Diagnosis not present

## 2021-06-12 MED ORDER — NITROGLYCERIN 0.4 MG SL SUBL
SUBLINGUAL_TABLET | SUBLINGUAL | Status: AC
  Filled 2021-06-12: qty 2

## 2021-06-12 MED ORDER — IOHEXOL 350 MG/ML SOLN
100.0000 mL | Freq: Once | INTRAVENOUS | Status: AC | PRN
  Administered 2021-06-12: 100 mL via INTRAVENOUS

## 2021-06-12 MED ORDER — NITROGLYCERIN 0.4 MG SL SUBL
0.8000 mg | SUBLINGUAL_TABLET | Freq: Once | SUBLINGUAL | Status: AC
  Administered 2021-06-12: 0.8 mg via SUBLINGUAL

## 2021-06-15 ENCOUNTER — Telehealth: Payer: Self-pay | Admitting: *Deleted

## 2021-06-15 NOTE — Telephone Encounter (Signed)
The patient has been notified of the result and verbalized understanding.  All questions (if any) were answered. Pt will follow up as scheduled 07/14/20. No further questions at this time.

## 2021-06-15 NOTE — Telephone Encounter (Signed)
-----   Message from Nelva Bush, MD sent at 06/15/2021  1:21 PM EST ----- Please let Joseph Wilcox know that his coronary CTA shows minimal plaque buildup in his coronary arteries.  His chest pain is likely to be due to a noncardiac issue, such as acid reflux.  I recommend that he continue using omeprazole.  It is ok for him to stop taking aspirin.  He should follow-up as previously arranged to reassess his symptoms.

## 2021-06-15 NOTE — Telephone Encounter (Signed)
Attempted to call pt. No answer. Lmtcb.  

## 2021-06-15 NOTE — Telephone Encounter (Signed)
Patient returning call.

## 2021-06-22 ENCOUNTER — Ambulatory Visit (INDEPENDENT_AMBULATORY_CARE_PROVIDER_SITE_OTHER): Admitting: Psychiatry

## 2021-06-22 ENCOUNTER — Encounter: Payer: Self-pay | Admitting: Psychiatry

## 2021-06-22 DIAGNOSIS — F411 Generalized anxiety disorder: Secondary | ICD-10-CM

## 2021-06-22 DIAGNOSIS — F3341 Major depressive disorder, recurrent, in partial remission: Secondary | ICD-10-CM | POA: Diagnosis not present

## 2021-06-22 MED ORDER — SERTRALINE HCL 100 MG PO TABS: 200.0000 mg | ORAL_TABLET | Freq: Every day | ORAL | 1 refills | Status: AC

## 2021-06-22 NOTE — Progress Notes (Signed)
Joseph Wilcox [id number] Sep 08, 1970 50 y.o.  Virtual Visit via Telephone Note  I connected with pt on 06/22/21 at 12:45 PM EST by telephone and verified that I am speaking with the correct person using two identifiers.   I discussed the limitations, risks, security and privacy concerns of performing an evaluation and management service by telephone and the availability of in person appointments. I also discussed with the patient that there may be a patient responsible charge related to this service. The patient expressed understanding and agreed to proceed.   I discussed the assessment and treatment plan with the patient. The patient was provided an opportunity to ask questions and all were answered. The patient agreed with the plan and demonstrated an understanding of the instructions.   The patient was advised to call back or seek an in-person evaluation if the symptoms worsen or if the condition fails to improve as anticipated.  I provided 15 minutes of non-face-to-face time during this encounter.  The patient was located at home.  The provider was located at [place].   Thayer Headings, PMHNP   Subjective:   Patient ID:  Joseph Wilcox is a 50 y.o. (DOB Sep 10, 1970) male.  Chief Complaint:  Chief Complaint  Patient presents with   Follow-up    Anxiety, depression, and insomnia    HPI Joseph Wilcox presents for follow-up of anxiety, depression, and insomnia. "I have been feeling pretty good." He reports that increase in Sertraline was helpful for anxiety. He reports that anxiety is "very seldom." Denies any recent depression. Denies worry. Denies any recent irritability. He has been sleeping well and falling asleep and staying asleep without difficulty. He reports wearing cPap consistently. He reports that he "always feel like I could take a nap." He reports low energy. Motivation is somewhat low- "think it's because I am comfortable at home." Appetite has been  good. Concentration has been good. Denies anhedonia. Denies SI.   Work has been going ok. Wife has been dealing with some health issues with MS.  Has not needed Temazepam prn.   Past Psychiatric Medication Trials: Duloxetine- Was taking 60 mg. Helpful for Wilcox anxiety and depression. Denies any long-term side effects. Initially had a "tickling, fluttering" sensation in chest.  Sertraline Abilify- Was taking 2 mg po qd. Gabapentin- Being prescribed for sciatic nerve pain. Has not noticed any effect on anxiety Ritalin- Took in childhood for ADD. Temazepam- Effective Trazodone- Nightmares   Review of Systems:  Review of Systems  Gastrointestinal: Negative.   Musculoskeletal:  Negative for gait problem.  Neurological:  Negative for tremors.  Psychiatric/Behavioral:         Please refer to HPI   Medications: I have reviewed the patient's current medications.  Current Outpatient Medications  Medication Sig Dispense Refill   albuterol (VENTOLIN HFA) 108 (90 Base) MCG/ACT inhaler Inhale 1-2 puffs into the lungs 2 (two) times daily as needed (cough). 1 each 0   amLODipine (NORVASC) 10 MG tablet Take 1 tablet (10 mg total) by mouth daily. 90 tablet 3   calcium carbonate (TUMS EX) 750 MG chewable tablet Chew 1-2 tablets by mouth as needed for heartburn.     EDARBYCLOR 40-25 MG TABS Take 1 tablet by mouth daily. 90 tablet 3   fluticasone (FLONASE) 50 MCG/ACT nasal spray Place 2 sprays into Wilcox nostrils daily. Use for 4-6 weeks then stop and use seasonally or as needed. 16 g 3   ibuprofen (ADVIL) 200 MG tablet Take 200-400 mg by  mouth every 6 (six) hours as needed for mild pain (or headaches).     omeprazole (PRILOSEC) 40 MG capsule Take 1 capsule (40 mg total) by mouth daily before breakfast. 90 capsule 3   potassium chloride SA (KLOR-CON M) 20 MEQ tablet Take 1 tablet (20 mEq total) by mouth daily. 90 tablet 3   pravastatin (PRAVACHOL) 20 MG tablet Take 1 tablet (20 mg total) by mouth  daily. 90 tablet 3   aspirin EC 81 MG tablet Take 1 tablet (81 mg total) by mouth daily. Swallow whole. (Patient not taking: Reported on 06/22/2021)     metoprolol tartrate (LOPRESSOR) 100 MG tablet Take 1 tablet (100 mg total) by mouth once for 1 dose. Take TWO hours prior to CT procedure 1 tablet 0   sertraline (ZOLOFT) 100 MG tablet Take 2 tablets (200 mg total) by mouth daily. 180 tablet 1   Testosterone (FORTESTA) 10 MG/ACT (2%) GEL Apply 2 pumps to each thigh region daily (Patient not taking: Reported on 06/01/2021) 60 g 2   No current facility-administered medications for this visit.    Medication Side Effects: None  Allergies: No Known Allergies  Past Medical History:  Diagnosis Date   Allergy    Anxiety    Depression    GERD (gastroesophageal reflux disease)    High cholesterol    Hx of migraines    Hypertension    Obstructive sleep apnea     Family History  Problem Relation Age of Onset   Diabetes Mother    Heart disease Maternal Aunt        Valve replacement; congenital heart disease   Heart attack Paternal Uncle    Prostate cancer Neg Hx    Colon cancer Neg Hx     Social History   Socioeconomic History   Marital status: Unknown    Spouse name: Not on file   Number of children: Not on file   Years of education: Not on file   Highest education level: Not on file  Occupational History   Not on file  Tobacco Use   Smoking status: Former    Types: Cigarettes    Quit date: 07/05/2010    Years since quitting: 10.9   Smokeless tobacco: Former   Tobacco comments:    Smokes occasional cigar.  Vaping Use   Vaping Use: Never used  Substance and Sexual Activity   Alcohol use: Yes    Alcohol/week: 1.0 standard drink    Types: 1 Standard drinks or equivalent per week    Comment: One drink/week   Drug use: No   Sexual activity: Yes    Partners: Female    Birth control/protection: None  Other Topics Concern   Not on file  Social History Narrative   Not on  file   Social Determinants of Health   Financial Resource Strain: Not on file  Food Insecurity: Not on file  Transportation Needs: Not on file  Physical Activity: Not on file  Stress: Not on file  Social Connections: Not on file  Intimate Partner Violence: Not on file    Past Medical History, Surgical history, Social history, and Family history were reviewed and updated as appropriate.   Please see review of systems for further details on the patient's review from today.   Objective:   Physical Exam:  There were no vitals taken for this visit.  Physical Exam Constitutional:      General: He is not in acute distress. Musculoskeletal:  General: No deformity.  Neurological:     Mental Status: He is alert and oriented to person, place, and time.     Coordination: Coordination normal.  Psychiatric:        Attention and Perception: Attention and perception normal. He does not perceive auditory or visual hallucinations.        Mood and Affect: Mood normal. Mood is not anxious or depressed. Affect is not labile, blunt, angry or inappropriate.        Speech: Speech normal.        Behavior: Behavior normal.        Thought Content: Thought content normal. Thought content is not paranoid or delusional. Thought content does not include homicidal or suicidal ideation. Thought content does not include homicidal or suicidal plan.        Cognition and Memory: Cognition and memory normal.        Judgment: Judgment normal.     Comments: Insight intact    Lab Review:     Component Value Date/Time   NA 142 06/08/2021 1540   K 3.4 (L) 06/08/2021 1540   CL 102 06/08/2021 1540   CO2 26 06/08/2021 1540   GLUCOSE 77 06/08/2021 1540   GLUCOSE 86 11/02/2020 1152   BUN 9 06/08/2021 1540   CREATININE 1.21 06/08/2021 1540   CREATININE 1.32 04/30/2020 0835   CALCIUM 9.4 06/08/2021 1540   PROT 6.7 11/02/2020 1228   ALBUMIN 3.8 11/02/2020 1228   AST 24 11/02/2020 1228   ALT 23 11/02/2020  1228   ALKPHOS 68 11/02/2020 1228   BILITOT 0.9 11/02/2020 1228   GFRNONAA 59 (L) 11/02/2020 1152   GFRNONAA 64 04/30/2020 0835   GFRAA 74 04/30/2020 0835       Component Value Date/Time   WBC 12.4 (H) 11/02/2020 1152   RBC 5.57 11/02/2020 1152   HGB 12.6 (L) 11/02/2020 1152   HGB 12.6 (L) 02/09/2019 0808   HCT 38.6 (L) 11/02/2020 1152   HCT 39.0 02/09/2019 0808   PLT 439 (H) 11/02/2020 1152   PLT 386 02/09/2019 0808   MCV 69.3 (L) 11/02/2020 1152   MCV 69 (L) 02/09/2019 0808   MCH 22.6 (L) 11/02/2020 1152   MCHC 32.6 11/02/2020 1152   RDW 15.8 (H) 11/02/2020 1152   RDW 17.5 (H) 02/09/2019 0808   LYMPHSABS 2,353 04/30/2020 0835   LYMPHSABS 3.1 02/09/2019 0808   EOSABS 121 04/30/2020 0835   EOSABS 0.1 02/09/2019 0808   BASOSABS 28 04/30/2020 0835   BASOSABS 0.0 02/09/2019 0808    No results found for: POCLITH, LITHIUM   No results found for: PHENYTOIN, PHENOBARB, VALPROATE, CBMZ   .res Assessment: Plan:    Continue Sertraline 200 mg po qd for anxiety and depression since he reports improved mood and anxiety s/s with Sertraline 200 mg qd.  Will discontinue Temazepam 15 mg po QHS prn due to non-use. Advised pt to call office if he experiences a recurrence of insomnia and Temazepam can be re-ordered.  Recommend continuing therapy with Rinaldo Cloud, LCSW.  Pt to follow-up in 6 months or sooner if clinically indicated.  Patient advised to contact office with any questions, adverse effects, or acute worsening in signs and symptoms.   Sohan was seen today for follow-up.  Diagnoses and all orders for this visit:  Generalized anxiety disorder -     sertraline (ZOLOFT) 100 MG tablet; Take 2 tablets (200 mg total) by mouth daily.  MDD (major depressive disorder), recurrent episode, moderate ([place]) -  sertraline (ZOLOFT) 100 MG tablet; Take 2 tablets (200 mg total) by mouth daily.    Please see After Visit Summary for patient specific instructions.  Future  Appointments  Date Time Provider [place]  06/24/2021  3:00 PM Shanon Ace, LCSW [facility] None  07/08/2021  2:40 PM Parks Ranger, Devonne Doughty, DO [facility]  07/14/2021  1:30 PM Furth, Cadence H, PA-C [id number]    No orders of the defined types were placed in this encounter.     -------------------------------

## 2021-06-24 ENCOUNTER — Ambulatory Visit (INDEPENDENT_AMBULATORY_CARE_PROVIDER_SITE_OTHER): Admitting: Psychiatry

## 2021-06-24 ENCOUNTER — Other Ambulatory Visit: Payer: Self-pay

## 2021-06-24 DIAGNOSIS — F411 Generalized anxiety disorder: Secondary | ICD-10-CM
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
## 2021-07-08 ENCOUNTER — Encounter: Admitting: Family Medicine

## 2021-07-13 NOTE — Progress Notes (Signed)
Office Visit    Patient Name: Joseph Wilcox Date of Encounter: 07/14/2021  PCP:  Olin Hauser, [place]  Cardiologist:  Nelva Bush, MD  Advanced Practice Provider:  No care team member to display Electrophysiologist:  None    Chief Complaint    Joseph Wilcox is a 51 y.o. male with a hx of hypertension, hyperlipidemia, OSA, GERD, migraine headaches, depression, and anxiety presents today for follow-up visit for his chest pain and mixed hyperlipidemia.  Past Medical History    Past Medical History:  Diagnosis Date   Allergy    Anxiety    Depression    GERD (gastroesophageal reflux disease)    High cholesterol    Hx of migraines    Hypertension    Obstructive sleep apnea    Past Surgical History:  Procedure Laterality Date   COLONOSCOPY WITH PROPOFOL N/A 03/09/2019   Procedure: COLONOSCOPY WITH PROPOFOL;  Surgeon: [name], MD;  Location: [facility] ENDOSCOPY;  Service: Gastroenterology;  Laterality: N/A;   ESOPHAGOGASTRODUODENOSCOPY (EGD) WITH PROPOFOL N/A 03/09/2019   Procedure: ESOPHAGOGASTRODUODENOSCOPY (EGD) WITH PROPOFOL;  Surgeon: [name], MD;  Location: [facility] ENDOSCOPY;  Service: Gastroenterology;  Laterality: N/A;   FOOT SURGERY  2002   Bone spur   TONSILLECTOMY      Allergies  No Known Allergies  History of Present Illness    Joseph Wilcox is a 51 y.o. male with a hx of hypertension, hyperlipidemia, OSA, GERD, migraine headaches, depression, and anxiety presents today for follow-up visit for his chest pain and mixed hyperlipidemia last seen 06/01/2021 by Dr. Saunders Revel.  The patient reported a several year history of chest pain occurring about once a month that he attributed to heartburn.  He had 1 particularly severe episode that occurred while driving to church in May.  He had to pull over and get an antacid from a convenience store and reports that it did not help.  He ultimately presented to the emergency  department where the work-up was unrevealing.  Shortly after, he began taking omeprazole on a regular basis and has not had any further chest pain for the last couple of months.  His prior chest pain was not associated with any other symptoms.  There were no exacerbating factors.  He had not had any exertional symptoms.  He denies history of heart disease or prior cardiac testing.  He has not had any shortness of breath.  His EKG at the time of his visit showed nonspecific T wave changes in the anterior leads.  Coupled with his multiple cardiac risk factors including hypertension, hyperlipidemia, obesity, gender, and prior tobacco use it was decided to undergo ischemic evaluation.  He underwent coronary CTA which revealed coronary calcium score of 0.  Minimal nonobstructive CAD.Marland Kitchen    Today, he feeling good without any chest pain.  Occasionally he gets dizzy when going from a seated to standing position.  He admits to not drinking enough water throughout the day and I suggested increasing his water intake.  His blood pressure is stable today 120/80.  He does not take his blood pressure at home.  He is only on Norvasc 10 mg daily for his blood pressure.  He has not had any shortness of breath or lower extremity edema.  We did discuss his lipid panel today and his triglycerides and LDL were not at goal.  We discussed dietary and exercise changes and the patient seems motivated.  He is working as  a Herbalist principal at a school.  He does get some activity throughout his day.  Reports no shortness of breath nor dyspnea on exertion. Reports no chest pain, pressure, or tightness. No edema, orthopnea, PND. Reports no palpitations (every few months his heart skips a beat, asymptomatic when this happens).   EKGs/Labs/Other Studies Reviewed:   The following studies were reviewed today:  IMPRESSION: 1. Coronary calcium score of 0. This was 0 percentile for age-, sex, and race-matched controls.   2.  Normal coronary origin with right dominance.   3. Minimal CAD (0-24 soft plaque stenosis in the proximal LAD).   RECOMMENDATIONS: CAD-RADS 1: Minimal non-obstructive CAD (0-24%). Consider non-atherosclerotic causes of chest pain. Consider preventive therapy and risk factor modification.   Kirk Ruths, MD    EKG:  EKG is not ordered today.   Recent Labs: 11/02/2020: ALT 23; Hemoglobin 12.6; Platelets 439 06/08/2021: BUN 9; Creatinine, Ser 1.21; Potassium 3.4; Sodium 142  Recent Lipid Panel    Component Value Date/Time   CHOL 249 (H) 04/30/2020 0835   TRIG 206 (H) 04/30/2020 0835   HDL 46 04/30/2020 0835   CHOLHDL 5.4 (H) 04/30/2020 0835   LDLCALC 166 (H) 04/30/2020 0835    Home Medications   Current Meds  Medication Sig   albuterol (VENTOLIN HFA) 108 (90 Base) MCG/ACT inhaler Inhale 1-2 puffs into the lungs 2 (two) times daily as needed (cough).   amLODipine (NORVASC) 10 MG tablet Take 1 tablet (10 mg total) by mouth daily.   calcium carbonate (TUMS EX) 750 MG chewable tablet Chew 1-2 tablets by mouth as needed for heartburn.   EDARBYCLOR 40-25 MG TABS Take 1 tablet by mouth daily.   fluticasone (FLONASE) 50 MCG/ACT nasal spray Place 2 sprays into both nostrils daily. Use for 4-6 weeks then stop and use seasonally or as needed.   ibuprofen (ADVIL) 200 MG tablet Take 200-400 mg by mouth every 6 (six) hours as needed for mild pain (or headaches).   icosapent Ethyl (VASCEPA) 1 g capsule Take 2 capsules (2 g total) by mouth 2 (two) times daily.   omeprazole (PRILOSEC) 40 MG capsule Take 1 capsule (40 mg total) by mouth daily before breakfast.   potassium chloride SA (KLOR-CON M) 20 MEQ tablet Take 1 tablet (20 mEq total) by mouth daily.   sertraline (ZOLOFT) 100 MG tablet Take 2 tablets (200 mg total) by mouth daily.   [DISCONTINUED] pravastatin (PRAVACHOL) 20 MG tablet Take 1 tablet (20 mg total) by mouth daily.     Review of Systems      All other systems reviewed and are  otherwise negative except as noted above.  Physical Exam    VS:  BP 120/80 (BP Location: Left Arm, Patient Position: Sitting, Cuff Size: Large)    Pulse 86    Ht 6\' 1"  (1.854 m)    Wt 295 lb (133.8 kg)    SpO2 98%    BMI 38.92 kg/m  , BMI Body mass index is 38.92 kg/m.  Wt Readings from Last 3 Encounters:  07/14/21 295 lb (133.8 kg)  06/01/21 294 lb (133.4 kg)  04/27/21 294 lb 3.2 oz (133.4 kg)     GEN: Well nourished, well developed, in no acute distress. HEENT: normal. Neck: Supple, no JVD, carotid bruits, or masses. Cardiac: RRR, no murmurs, rubs, or gallops. No clubbing, cyanosis, edema.  Radials/PT 2+ and equal bilaterally.  Respiratory:  Respirations regular and unlabored, clear to auscultation bilaterally. GI: Soft, nontender, nondistended. MS: No  deformity or atrophy. Skin: Warm and dry, no rash. Neuro:  Strength and sensation are intact. Psych: Normal affect.  Assessment & Plan    Chest pain -better with initiation of omeprazole, none since  -Coronary CTA revealed a calcium score of 0, ASA discontinued  -Would hold off on further ischemic evaluation at this time -Continue pravastatin 20 mg daily, Norvasc 10 mg daily  Hypertension -Well-controlled today in the clinic -Continue current medication regimen -Continue low-sodium, heart healthy diet  Hyperlipidemia -Most recent lipid panel 04/2020 showed triglycerides 206, HDL 46, LDL 166 -Titration of pravastatin up to 40 mg daily -Repeat lipid panel in 6-8 weeks -Start Vascepa  GERD -Continue omeprazole and as needed antacid   Disposition: Follow up 2 months with Nelva Bush, MD or APP.  Signed, Elgie Collard, PA-C 07/14/2021, 2:09 PM [place]

## 2021-07-14 ENCOUNTER — Encounter: Payer: Self-pay | Admitting: Medical

## 2021-07-14 ENCOUNTER — Ambulatory Visit (INDEPENDENT_AMBULATORY_CARE_PROVIDER_SITE_OTHER): Admitting: Physician Assistant

## 2021-07-14 ENCOUNTER — Other Ambulatory Visit: Payer: Self-pay

## 2021-07-14 VITALS — BP 120/80 | HR 86 | Ht 73.0 in | Wt 295.0 lb

## 2021-07-14 DIAGNOSIS — K219 Gastro-esophageal reflux disease without esophagitis: Secondary | ICD-10-CM

## 2021-07-14 DIAGNOSIS — R079 Chest pain, unspecified: Secondary | ICD-10-CM

## 2021-07-14 DIAGNOSIS — I10 Essential (primary) hypertension: Secondary | ICD-10-CM | POA: Diagnosis not present

## 2021-07-14 DIAGNOSIS — E782 Mixed hyperlipidemia: Secondary | ICD-10-CM | POA: Diagnosis not present

## 2021-07-14 MED ORDER — ICOSAPENT ETHYL 1 G PO CAPS: 2.0000 g | ORAL_CAPSULE | Freq: Two times a day (BID) | ORAL | 3 refills | Status: DC

## 2021-07-14 MED ORDER — PRAVASTATIN SODIUM 40 MG PO TABS: 40.0000 mg | ORAL_TABLET | Freq: Every day | ORAL | 3 refills | Status: DC

## 2021-07-14 NOTE — Patient Instructions (Signed)
Medication Instructions:  Your physician has recommended you make the following change in your medication:   INCREASE pravastatin (Pravachol) to 40 mg daily   START taking icosapent Ethyl (Vascepa) 2g twice a day   *If you need a refill on your cardiac medications before your next appointment, please call your pharmacy*   Lab Work:  Your physician recommends that you return for a FASTING lipid profile and LFTs: The week prior to your 6 week follow up.  - You will need to be fasting. Please do not have anything to eat or drink after midnight the morning you have the lab work. You may only have water or black coffee with no cream or sugar.   We will call you closer to your appointment time and schedule you in our office for this lab draw.   If you have labs (blood work) drawn today and your tests are completely normal, you will receive your results only by: [place] (if you have MyChart) OR A paper copy in the mail If you have any lab test that is abnormal or we need to change your treatment, we will call you to review the results.   Testing/Procedures: None ordered   Follow-Up: At [facility], you and your health needs are our priority.  As part of our continuing mission to provide you with exceptional heart care, we have created designated Provider Care Teams.  These Care Teams include your primary Cardiologist (physician) and Advanced Practice Providers (APPs -  Physician Assistants and Nurse Practitioners) who all work together to provide you with the care you need, when you need it.  We recommend signing up for the patient portal called "MyChart".  Sign up information is provided on this After Visit Summary.  MyChart is used to connect with patients for Virtual Visits (Telemedicine).  Patients are able to view lab/test results, encounter notes, upcoming appointments, etc.  Non-urgent messages can be sent to your provider as well.   To learn more about what you can do with  MyChart, go to [email or web address].    Your next appointment:   6 week(s)  The format for your next appointment:   In Person  Provider:   You may see Nelva Bush, MD or one of the following Advanced Practice Providers on your designated Care Team:   Murray Hodgkins, NP Christell Faith, PA-C Cadence Kathlen Mody, PA-C  :1}    Other Instructions N/A

## 2021-08-25 ENCOUNTER — Encounter
Admission: RE | Admit: 2021-08-25 | Discharge: 2021-08-25 | Disposition: A | Discharge status: Home / Self Care | Attending: Medical | Admitting: Medical

## 2021-08-25 ENCOUNTER — Other Ambulatory Visit: Payer: Self-pay

## 2021-08-25 ENCOUNTER — Ambulatory Visit (INDEPENDENT_AMBULATORY_CARE_PROVIDER_SITE_OTHER): Admitting: Medical

## 2021-08-25 ENCOUNTER — Encounter: Payer: Self-pay | Admitting: Medical

## 2021-08-25 VITALS — BP 120/80 | HR 88 | Ht 73.0 in | Wt 295.0 lb

## 2021-08-25 DIAGNOSIS — I10 Essential (primary) hypertension: Secondary | ICD-10-CM | POA: Diagnosis not present

## 2021-08-25 DIAGNOSIS — E782 Mixed hyperlipidemia: Secondary | ICD-10-CM

## 2021-08-25 DIAGNOSIS — R079 Chest pain, unspecified: Secondary | ICD-10-CM

## 2021-08-25 LAB — LIPID PANEL
Cholesterol: 258 mg/dL — ABNORMAL HIGH (ref 0–200)
HDL: 46 mg/dL (ref >40)
LDL Cholesterol: 166 mg/dL — ABNORMAL HIGH (ref 0–99)
Total CHOL/HDL Ratio: 5.6 RATIO
Triglycerides: 230 mg/dL — ABNORMAL HIGH (ref <150)
VLDL: 46 mg/dL — ABNORMAL HIGH (ref 0–40)

## 2021-08-25 LAB — HEPATIC FUNCTION PANEL
ALT: 25 U/L (ref 0–44)
AST: 26 U/L (ref 15–41)
Albumin: 4 g/dL (ref 3.5–5.0)
Alkaline Phosphatase: 67 U/L (ref 38–126)
Bilirubin, Direct: <0.1 mg/dL (ref 0.0–0.2)
Total Bilirubin: 1.2 mg/dL (ref 0.3–1.2)
Total Protein: 7.1 g/dL (ref 6.5–8.1)

## 2021-08-25 MED ORDER — FENOFIBRATE 48 MG PO TABS: 48.0000 mg | ORAL_TABLET | Freq: Every day | ORAL | 3 refills | Status: DC

## 2021-08-25 MED ORDER — EZETIMIBE 10 MG PO TABS: 10.0000 mg | ORAL_TABLET | Freq: Every day | ORAL | 3 refills | Status: AC

## 2021-08-25 MED ORDER — ATORVASTATIN CALCIUM 80 MG PO TABS: 80.0000 mg | ORAL_TABLET | Freq: Every day | ORAL | 3 refills | Status: DC

## 2021-08-25 NOTE — Patient Instructions (Signed)
Medication Instructions:  Your physician has recommended you make the following change in your medication:   STOP taking pravastatin (Pravachol)   START taking ezetimibe (Zetia) 10 mg daily   START taking fenofibrate (Tricor) 48 mg daily   START taking atorvastatin (Lipitor) 80 mg daily   *If you need a refill on your cardiac medications before your next appointment, please call your pharmacy*   Lab Work: Your physician recommends that you return for a FASTING lipid profile: The week prior to your 4 month follow up.  - You will need to be fasting. Please do not have anything to eat or drink after midnight the morning you have the lab work. You may only have water or black coffee with no cream or sugar.   We will call you closer to your appointment time and schedule you in our office for this lab draw.   If you have labs (blood work) drawn today and your tests are completely normal, you will receive your results only by: [place] (if you have MyChart) OR A paper copy in the mail If you have any lab test that is abnormal or we need to change your treatment, we will call you to review the results.   Testing/Procedures: None ordered   Follow-Up: At [facility], you and your health needs are our priority.  As part of our continuing mission to provide you with exceptional heart care, we have created designated Provider Care Teams.  These Care Teams include your primary Cardiologist (physician) and Advanced Practice Providers (APPs -  Physician Assistants and Nurse Practitioners) who all work together to provide you with the care you need, when you need it.  We recommend signing up for the patient portal called "MyChart".  Sign up information is provided on this After Visit Summary.  MyChart is used to connect with patients for Virtual Visits (Telemedicine).  Patients are able to view lab/test results, encounter notes, upcoming appointments, etc.  Non-urgent messages can be sent  to your provider as well.   To learn more about what you can do with MyChart, go to [email or web address].    Your next appointment:   4 month(s)  The format for your next appointment:   In Person  Provider:   You may see Nelva Bush, MD or one of the following Advanced Practice Providers on your designated Care Team:   Murray Hodgkins, NP Christell Faith, PA-C Cadence Kathlen Mody, [place]   Other Instructions N/A

## 2021-08-25 NOTE — Addendum Note (Signed)
Addended by: Sunday Spillers on: 08/25/2021 09:22 AM   Modules accepted: Orders

## 2021-08-25 NOTE — Progress Notes (Signed)
Cardiology Office Note:    Date:  08/25/2021   ID:  Joseph Wilcox, DOB [birth year], MRN [id number]  PCP:  Olin Hauser, DO  [facility] Cardiologist:  Nelva Bush, MD  [facility] Electrophysiologist:  None   Referring MD: Nobie Putnam *   Chief Complaint: F/u lipids  History of Present Illness:    Joseph Wilcox is a 51 y.o. male with a hx of hypertension, hyperlipidemia, OSA, GERD, migraine headaches, depression, and anxiety presents today for follow-up.  The patient reported a several year history of chest pain occurring about once a month that he attributed to heartburn.  He had 1 particularly severe episode that occurred while driving to church in May.  He had to pull over and get an antacid from a convenience store and reports that it did not help.  He ultimately presented to the emergency department where the work-up was unrevealing.  Shortly after, he began taking omeprazole on a regular basis and has not had any further chest pain for the last couple of months.  His prior chest pain was not associated with any other symptoms.  There were no exacerbating factors.  He had not had any exertional symptoms.  He denies history of heart disease or prior cardiac testing.  He has not had any shortness of breath.  His EKG at the time of his visit showed nonspecific T wave changes in the anterior leads.  Coupled with his multiple cardiac risk factors including hypertension, hyperlipidemia, obesity, gender, and prior tobacco use it was decided to undergo ischemic evaluation.  He underwent coronary CTA which revealed coronary calcium score of 0.  Minimal nonobstructive CAD..   The patient was last seen 07/14/21 and reported no chest pain with iniriation of omeprazole. He was continued on statin and amlodipine. Vascepa was started for HLD.   Today, lipid profile today with worse numbers. He only recently decieded to cut out all sweets in his diet. Insurance didn't  cover Vascepa, so he was unable to start this. No chest pain, SOB, LLE, rorthopnea, pnd. BP is good.   Past Medical History:  Diagnosis Date   Allergy    Anxiety    Depression    GERD (gastroesophageal reflux disease)    High cholesterol    Hx of migraines    Hypertension    Obstructive sleep apnea     Past Surgical History:  Procedure Laterality Date   COLONOSCOPY WITH PROPOFOL N/A 03/09/2019   Procedure: COLONOSCOPY WITH PROPOFOL;  Surgeon: [name], MD;  Location: [facility] ENDOSCOPY;  Service: Gastroenterology;  Laterality: N/A;   ESOPHAGOGASTRODUODENOSCOPY (EGD) WITH PROPOFOL N/A 03/09/2019   Procedure: ESOPHAGOGASTRODUODENOSCOPY (EGD) WITH PROPOFOL;  Surgeon: [name], MD;  Location: [facility] ENDOSCOPY;  Service: Gastroenterology;  Laterality: N/A;   FOOT SURGERY  2002   Bone spur   TONSILLECTOMY      Current Medications: Current Meds  Medication Sig   albuterol (VENTOLIN HFA) 108 (90 Base) MCG/ACT inhaler Inhale 1-2 puffs into the lungs 2 (two) times daily as needed (cough).   amLODipine (NORVASC) 10 MG tablet Take 1 tablet (10 mg total) by mouth daily.   atorvastatin (LIPITOR) 80 MG tablet Take 1 tablet (80 mg total) by mouth daily.   calcium carbonate (TUMS EX) 750 MG chewable tablet Chew 1-2 tablets by mouth as needed for heartburn.   EDARBYCLOR 40-25 MG TABS Take 1 tablet by mouth daily.   ezetimibe (ZETIA) 10 MG tablet Take 1 tablet (10 mg total) by mouth  daily.   fenofibrate (TRICOR) 48 MG tablet Take 1 tablet (48 mg total) by mouth daily.   fluticasone (FLONASE) 50 MCG/ACT nasal spray Place 2 sprays into both nostrils daily. Use for 4-6 weeks then stop and use seasonally or as needed.   ibuprofen (ADVIL) 200 MG tablet Take 200-400 mg by mouth every 6 (six) hours as needed for mild pain (or headaches).   omeprazole (PRILOSEC) 40 MG capsule Take 1 capsule (40 mg total) by mouth daily before breakfast.   potassium chloride SA (KLOR-CON M) 20 MEQ tablet Take 1 tablet (20 mEq  total) by mouth daily.   sertraline (ZOLOFT) 100 MG tablet Take 2 tablets (200 mg total) by mouth daily.   Testosterone (FORTESTA) 10 MG/ACT (2%) GEL Apply 2 pumps to each thigh region daily   [DISCONTINUED] pravastatin (PRAVACHOL) 40 MG tablet Take 1 tablet (40 mg total) by mouth daily.     Allergies:   Patient has no known allergies.   Social History   Socioeconomic History   Marital status: Unknown    Spouse name: Not on file   Number of children: Not on file   Years of education: Not on file   Highest education level: Not on file  Occupational History   Not on file  Tobacco Use   Smoking status: Former    Types: Cigars, Cigarettes    Quit date: 07/05/2010    Years since quitting: 11.1   Smokeless tobacco: Former   Tobacco comments:    Smokes occasional cigar.  Vaping Use   Vaping Use: Never used  Substance and Sexual Activity   Alcohol use: Yes    Alcohol/week: 1.0 standard drink    Types: 1 Standard drinks or equivalent per week    Comment: One drink/week   Drug use: No   Sexual activity: Yes    Partners: Female    Birth control/protection: None  Other Topics Concern   Not on file  Social History Narrative   Not on file   Social Determinants of Health   Financial Resource Strain: Not on file  Food Insecurity: Not on file  Transportation Needs: Not on file  Physical Activity: Not on file  Stress: Not on file  Social Connections: Not on file     Family History: The patient's family history includes Diabetes in his mother; Heart attack in his paternal uncle; Heart disease in his maternal aunt. There is no history of Prostate cancer or Colon cancer.  ROS:   Please see the history of present illness.     All other systems reviewed and are negative.  EKGs/Labs/Other Studies Reviewed:    The following studies were reviewed today:  Coronary CTA 2022   IMPRESSION: 1. Coronary calcium score of 0. This was 0 percentile for age-, sex, and race-matched  controls.   2. Normal coronary origin with right dominance.   3. Minimal CAD (0-24 soft plaque stenosis in the proximal LAD).   RECOMMENDATIONS: CAD-RADS 1: Minimal non-obstructive CAD (0-24%). Consider non-atherosclerotic causes of chest pain. Consider preventive therapy and risk factor modification.   Kirk Ruths, MD     Electronically Signed   By: Kirk Ruths M.D.   On: 06/12/2021 13:50    EKG:  EKG is not ordered today.   Recent Labs: 11/02/2020: Hemoglobin 12.6; Platelets 439 06/08/2021: BUN 9; Creatinine, Ser 1.21; Potassium 3.4; Sodium 142 08/25/2021: ALT 25  Recent Lipid Panel    Component Value Date/Time   CHOL 258 (H) 08/25/2021 7619  TRIG 230 (H) 08/25/2021 0926   HDL 46 08/25/2021 0926   CHOLHDL 5.6 08/25/2021 0926   VLDL 46 (H) 08/25/2021 0926   LDLCALC 166 (H) 08/25/2021 0926   LDLCALC 166 (H) 04/30/2020 0835     Physical Exam:    VS:  BP 120/80 (BP Location: Left Arm, Patient Position: Sitting, Cuff Size: Large)    Pulse 88    Ht 6\' 1"  (1.854 m)    Wt 295 lb (133.8 kg)    SpO2 98%    BMI 38.92 kg/m     Wt Readings from Last 3 Encounters:  08/25/21 295 lb (133.8 kg)  07/14/21 295 lb (133.8 kg)  06/01/21 294 lb (133.4 kg)     GEN:  Well nourished, well developed in no acute distress HEENT: Normal NECK: No JVD; No carotid bruits LYMPHATICS: No lymphadenopathy CARDIAC: RRR, no murmurs, rubs, gallops RESPIRATORY:  Clear to auscultation without rales, wheezing or rhonchi  ABDOMEN: Soft, non-tender, non-distended MUSCULOSKELETAL:  No edema; No deformity  SKIN: Warm and dry NEUROLOGIC:  Alert and oriented x 3 PSYCHIATRIC:  Normal affect   ASSESSMENT:    1. Chest pain of uncertain etiology   2. Essential hypertension   3. Hyperlipidemia, mixed    PLAN:    In order of problems listed above:  Chest pain Patient denies further chest pain, previously improved on PPI. Coronary CTA showed calcium score of 0. No further work-up at this time.    HTN BP is good today. Continue amlodipine and Edarbyclor.  HLD Insurance didn't cover Vascepa. Repeat Lipids today with minimally higher numbers than last check. TG 230, Total chol 258, LDL 166, HDL 46. I will transition Pravastatin to Lipitor 80mg  daily and add Zetia and fenofibrate. Patient reported recent diet changes. Re-check lipids in 4-6 weeks.   Disposition: Follow up in 4 month(s) with MD/APP   Signed, Tracee Mccreery Ninfa Meeker, PA-C  08/25/2021 3:39 PM    [facility]

## 2021-09-08 ENCOUNTER — Other Ambulatory Visit: Payer: Self-pay

## 2021-09-08 ENCOUNTER — Ambulatory Visit (INDEPENDENT_AMBULATORY_CARE_PROVIDER_SITE_OTHER): Admitting: Psychiatry

## 2021-09-08 DIAGNOSIS — F411 Generalized anxiety disorder: Secondary | ICD-10-CM
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
## 2021-11-09 ENCOUNTER — Ambulatory Visit: Admitting: Psychiatry

## 2021-12-23 ENCOUNTER — Ambulatory Visit: Admitting: Nurse Practitioner

## 2022-01-09 IMAGING — CT CT HEART MORP W/ CTA COR W/ SCORE W/ CA W/CM &/OR W/O CM
4 of 7 series · 8 of 20 positions shown, 9 images · IV contrast (APPLIED)
Comparison: None.
COMPARISON: None.

Addendum:
EXAM:
OVER-READ INTERPRETATION  CT CHEST

The following report is an over-read performed by radiologist Dr.
Munira Kassim [[email or web address]] on 06/12/2021. This
over-read does not include interpretation of cardiac or coronary
anatomy or pathology. The coronary calcium score/coronary CTA
interpretation by the cardiologist is attached.
CLINICAL DATA: 49 yo male with chest pain
Cardiac/Coronary CTA
TECHNIQUE: A non-contrast, gated CT scan was obtained with axial slices of 3 mm
through the heart for calcium scoring. Calcium scoring was performed
using the Agatston method. A 120 kV prospective, gated, contrast
cardiac scan was obtained. Gantry rotation speed was 250 msecs and
collimation was 0.6 mm. Two sublingual nitroglycerin tablets (0.8
mg) were given. The 3D data set was reconstructed in 5% intervals of
the 35-75% of the R-R cycle. Diastolic phases were analyzed on a
dedicated workstation using MPR, MIP, and VRT modes. The patient
received 95 cc of contrast.

[Series 7: best diast 69 % · axial · 0.39mm/px · z∈[-105,-62]mm · 2 of 326 slices shown]
[im 109/326  vessel]
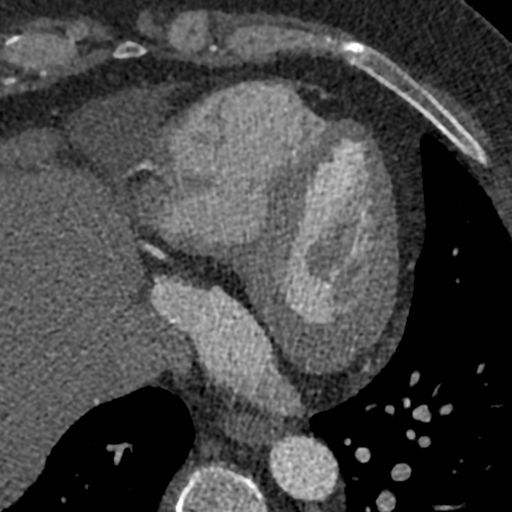
[im 217/326  vessel]
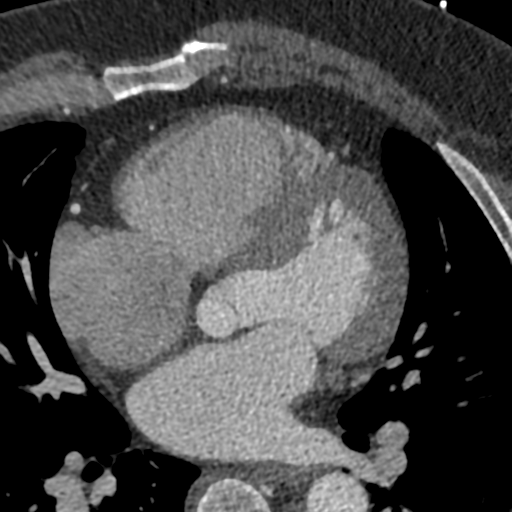

[Series 10: best syst 37 % · axial · 0.39mm/px · z∈[-105,-62]mm · 2 of 326 slices shown]
[im 109/326  vessel]
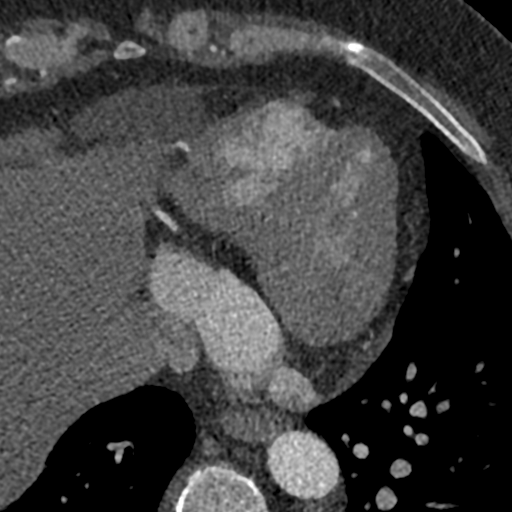
[im 217/326  vessel]
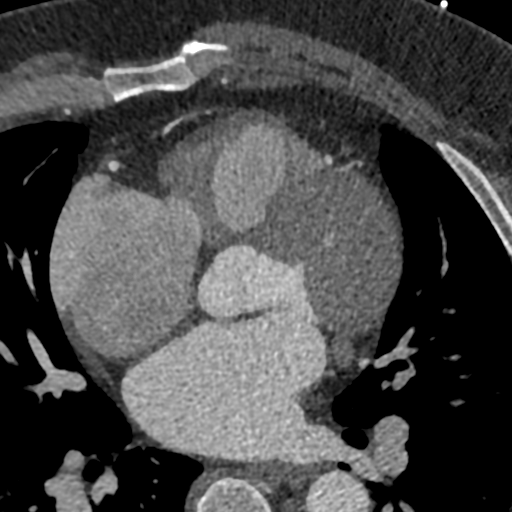

[Series 11: ts diast sharp · axial · 0.39mm/px · z∈[-105,-62]mm · 2 of 326 slices shown, 3 images]
[im 109/326  vessel]
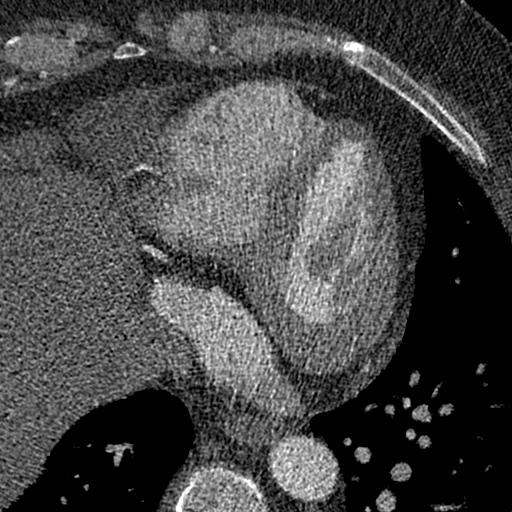
[im 109/326  lung]
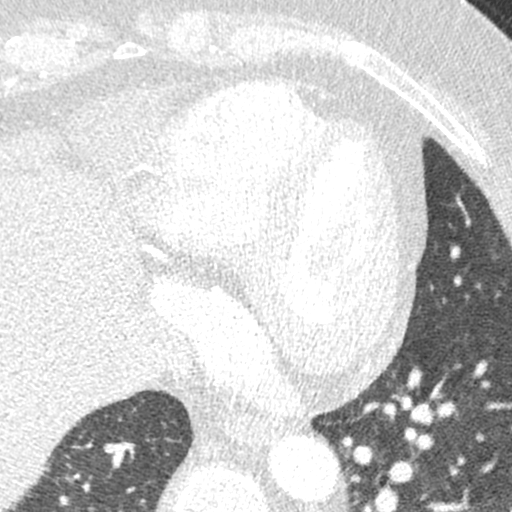
[im 217/326  vessel]
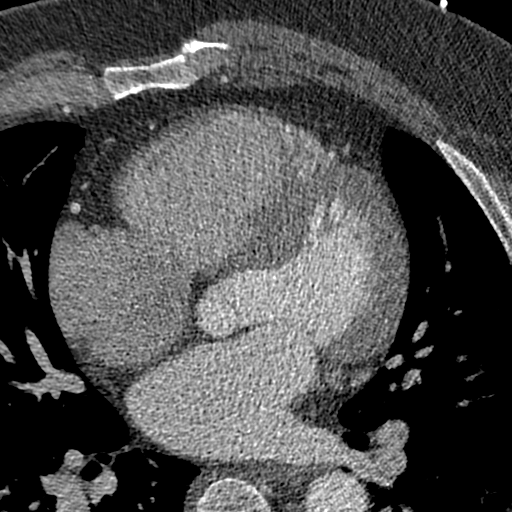

[Series 12: ts syst sharp · axial · 0.39mm/px · z∈[-105,-62]mm · 2 of 326 slices shown]
[im 109/326  lung]
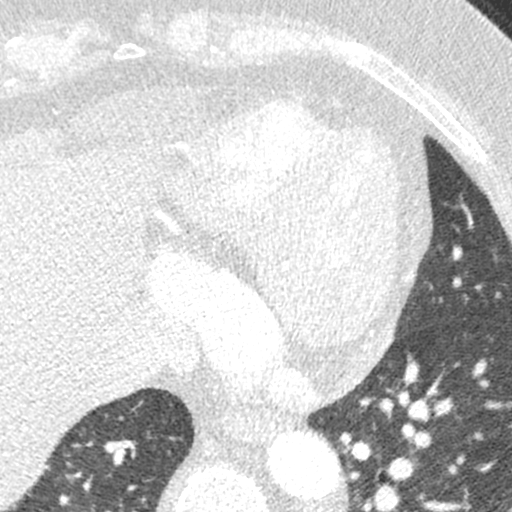
[im 217/326  lung]
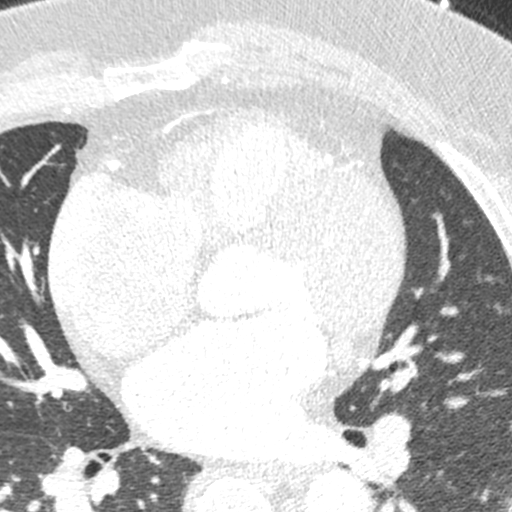

[8 of 20 positions shown; findings below may reference images not displayed]

FINDINGS: Within the visualized portions of the thorax there are no suspicious
appearing pulmonary nodules or masses, there is no acute
consolidative airspace disease, no pleural effusions, no
pneumothorax and no lymphadenopathy. Visualized portions of the
upper abdomen are unremarkable. There are no aggressive appearing
lytic or blastic lesions noted in the visualized portions of the
skeleton.
IMPRESSION: 1. No significant incidental noncardiac findings are noted.
FINDINGS: Image quality: Poor.

Noise artifact is: Moderate.  (Signal-to-noise; obese).

Coronary Arteries:  Normal coronary origin.  Right dominance.

Left main: The left main is a large caliber vessel with a normal
take off from the left coronary cusp that trifurcates into a LAD,
LCX, and ramus intermedius. There is no plaque or stenosis.

Left anterior descending artery: The LAD has minimal (0-24) soft
plaque in the proximal vessel. The LAD gives off 2 small patent
diagonal branches.

Ramus intermedius: Patent with no evidence of plaque or stenosis.

Left circumflex artery: The LCX is non-dominant and patent with no
evidence of plaque or stenosis. The LCX gives off 2 small patent
obtuse marginal branches and a large branching OM3.

Right coronary artery: The RCA is dominant with normal take off from
the right coronary cusp. There is no evidence of plaque or stenosis.
The RCA terminates as a PDA and right posterolateral branch without
evidence of plaque or stenosis.

Right Atrium: Right atrial size is within normal limits.

Right Ventricle: The right ventricular cavity is within normal
limits.

Left Atrium: Left atrial size is normal in size with no left atrial
appendage filling defect.

Left Ventricle: The ventricular cavity size is within normal limits.
There are no stigmata of prior infarction. There is no abnormal
filling defect.

Pulmonary arteries: Normal in size without proximal filling defect.

Pulmonary veins: Normal pulmonary venous drainage.

Pericardium: Normal thickness with no significant effusion or
calcium present.

Cardiac valves: The aortic valve is trileaflet without significant
calcification. The mitral valve is normal structure without
significant calcification.

Aorta: Normal caliber with no significant disease.

Extra-cardiac findings: See attached radiology report for
non-cardiac structures.
IMPRESSION: 1. Coronary calcium score of 0. This was 0 percentile for age-, sex,
and race-matched controls.

2. Normal coronary origin with right dominance.

3. Minimal CAD (0-24 soft plaque stenosis in the proximal LAD).

RECOMMENDATIONS:
CAD-RADS 1: Minimal non-obstructive CAD (0-24%). Consider
non-atherosclerotic causes of chest pain. Consider preventive
therapy and risk factor modification.

*** End of Addendum ***
EXAM:
OVER-READ INTERPRETATION  CT CHEST

The following report is an over-read performed by radiologist Dr.
Munira Kassim [[email or web address]] on 06/12/2021. This
over-read does not include interpretation of cardiac or coronary
anatomy or pathology. The coronary calcium score/coronary CTA
interpretation by the cardiologist is attached.
FINDINGS: Within the visualized portions of the thorax there are no suspicious
appearing pulmonary nodules or masses, there is no acute
consolidative airspace disease, no pleural effusions, no
pneumothorax and no lymphadenopathy. Visualized portions of the
upper abdomen are unremarkable. There are no aggressive appearing
lytic or blastic lesions noted in the visualized portions of the
skeleton.
IMPRESSION: 1. No significant incidental noncardiac findings are noted.

## 2022-01-13 ENCOUNTER — Other Ambulatory Visit: Payer: Self-pay | Admitting: Medical

## 2022-01-14 ENCOUNTER — Other Ambulatory Visit: Payer: Self-pay | Admitting: *Deleted

## 2022-01-19 MED ORDER — FENOFIBRATE 48 MG PO TABS: 48.0000 mg | ORAL_TABLET | Freq: Every day | ORAL | 0 refills | Status: AC

## 2022-02-24 ENCOUNTER — Ambulatory Visit (INDEPENDENT_AMBULATORY_CARE_PROVIDER_SITE_OTHER): Admitting: Nurse Practitioner

## 2022-02-24 ENCOUNTER — Encounter: Payer: Self-pay | Admitting: Nurse Practitioner

## 2022-02-24 VITALS — BP 130/82 | HR 79 | Ht 73.0 in | Wt 303.0 lb

## 2022-02-24 DIAGNOSIS — R072 Precordial pain: Secondary | ICD-10-CM | POA: Diagnosis not present

## 2022-02-24 DIAGNOSIS — E785 Hyperlipidemia, unspecified: Secondary | ICD-10-CM | POA: Diagnosis not present

## 2022-02-24 NOTE — Progress Notes (Signed)
Office Visit    Patient Name: Joseph Wilcox Date of Encounter: 02/24/2022  Primary Care Provider:  Olin Hauser, DO Primary Cardiologist:  Nelva Bush, MD  Chief Complaint    51 y/o ? w/ a h/o chest pain and nonobs CAD, HTN, HL, OSA, GERD, migraines, depression, and anxiety, who presents for f/u related to hyperlipidemia.  Past Medical History    Past Medical History:  Diagnosis Date   Allergy    Anxiety    Chest pain    a. 06/2021 Cor CTA: Ca2+ = 0. LAD 0-24% plaque. Otw nl cors.   Depression    GERD (gastroesophageal reflux disease)    High cholesterol    Hx of migraines    Hypertension    Obstructive sleep apnea    Past Surgical History:  Procedure Laterality Date   COLONOSCOPY WITH PROPOFOL N/A 03/09/2019   Procedure: COLONOSCOPY WITH PROPOFOL;  Surgeon: [name], MD;  Location: [facility] ENDOSCOPY;  Service: Gastroenterology;  Laterality: N/A;   ESOPHAGOGASTRODUODENOSCOPY (EGD) WITH PROPOFOL N/A 03/09/2019   Procedure: ESOPHAGOGASTRODUODENOSCOPY (EGD) WITH PROPOFOL;  Surgeon: [name], MD;  Location: [facility] ENDOSCOPY;  Service: Gastroenterology;  Laterality: N/A;   FOOT SURGERY  2002   Bone spur   TONSILLECTOMY      Allergies  No Known Allergies  History of Present Illness    51 y/o ? w/ a h/o chest pain and nonobs CAD, HTN, HL, OSA, GERD, migraines, depression, and anxiety.  He was previously evaluated in late 2022 with a several year history of intermittent chest pain occurring about once a month.  He underwent coronary CT angiogram in December 2022 showing calcium score of 0 with 0 to 24% LAD plaque and otherwise normal coronary arteries.  He has since been managed for ongoing hyperlipidemia/hypertriglyceridemia.  In February, his total cholesterol was 258 with triglycerides of 230, and an LDL of 166.  At that time, he was only taking pravastatin, and this was switched to atorvastatin 80 mg along with fenofibrate 48 mg daily and Zetia 10 mg  daily.    Since his last visit on August 25, 2021, he reports doing well.  He has been taking his medications and tolerating well, though forgot to come back for follow-up lab work 6 weeks after initiation.  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  He hopes to lose 30 to 50 pounds between now and his birthday in December and plans to begin walking regularly and cutting out sweets in his diet.  He denies chest pain, dyspnea, palpitations, PND, orthopnea, dizziness, syncope, edema, or early satiety.  Home Medications    Current Outpatient Medications  Medication Sig Dispense Refill   amLODipine (NORVASC) 10 MG tablet Take 1 tablet (10 mg total) by mouth daily. 90 tablet 3   atorvastatin (LIPITOR) 80 MG tablet Take 1 tablet (80 mg total) by mouth daily. 90 tablet 3   EDARBYCLOR 40-25 MG TABS Take 1 tablet by mouth daily. 90 tablet 3   ezetimibe (ZETIA) 10 MG tablet Take 1 tablet (10 mg total) by mouth daily. 90 tablet 3   fenofibrate (TRICOR) 48 MG tablet Take 1 tablet (48 mg total) by mouth daily. 90 tablet 0   fluticasone (FLONASE) 50 MCG/ACT nasal spray Place 2 sprays into both nostrils daily. Use for 4-6 weeks then stop and use seasonally or as needed. 16 g 3   omeprazole (PRILOSEC) 40 MG capsule Take 1 capsule (40  mg total) by mouth daily before breakfast. 90 capsule 3   potassium chloride SA (KLOR-CON M) 20 MEQ tablet Take 1 tablet (20 mEq total) by mouth daily. 90 tablet 3   sertraline (ZOLOFT) 100 MG tablet Take 2 tablets (200 mg total) by mouth daily. 180 tablet 1   Testosterone (FORTESTA) 10 MG/ACT (2%) GEL Apply 2 pumps to each thigh region daily (Patient not taking: Reported on 02/24/2022) 60 g 2   No current facility-administered medications for this visit.     Review of Systems    He denies chest pain, palpitations, dyspnea, pnd, orthopnea, n, v, dizziness, syncope, edema, weight gain, or early satiety.  Occasionally notes low back pain.   All other systems reviewed and are otherwise negative except as noted above.    Physical Exam    VS:  BP 130/82 (BP Location: Left Arm, Patient Position: Sitting, Cuff Size: Normal)   Pulse 79   Ht '6\' 1"'$  (1.854 m)   Wt (!) 303 lb (137.4 kg)   SpO2 98%   BMI 39.98 kg/m  , BMI Body mass index is 39.98 kg/m.     GEN: Obese, in no acute distress. HEENT: normal. Neck: Supple, no JVD, carotid bruits, or masses. Cardiac: RRR, no murmurs, rubs, or gallops. No clubbing, cyanosis, edema.  Radials/DP/PT 2+ and equal bilaterally.  Respiratory:  Respirations regular and unlabored, clear to auscultation bilaterally. GI: Obese, soft, nontender, nondistended, BS + x 4. MS: no deformity or atrophy. Skin: warm and dry, no rash. Neuro:  Strength and sensation are intact. Psych: Normal affect.  Accessory Clinical Findings    Lab Results  Component Value Date   WBC 12.4 (H) 11/02/2020   HGB 12.6 (L) 11/02/2020   HCT 38.6 (L) 11/02/2020   MCV 69.3 (L) 11/02/2020   PLT 439 (H) 11/02/2020   Lab Results  Component Value Date   CREATININE 1.21 06/08/2021   BUN 9 06/08/2021   NA 142 06/08/2021   K 3.4 (L) 06/08/2021   CL 102 06/08/2021   CO2 26 06/08/2021   Lab Results  Component Value Date   ALT 25 08/25/2021   AST 26 08/25/2021   ALKPHOS 67 08/25/2021   BILITOT 1.2 08/25/2021   Lab Results  Component Value Date   CHOL 258 (H) 08/25/2021   HDL 46 08/25/2021   LDLCALC 166 (H) 08/25/2021   TRIG 230 (H) 08/25/2021   CHOLHDL 5.6 08/25/2021    Lab Results  Component Value Date   HGBA1C 6.1 (H) 09/19/2020    Assessment & Plan    1.  Precordial chest pain/nonobstructive CAD: Previously evaluated for precordial chest pain with coronary CT angiogram which showed 0 to 24% LAD plaque and calcium score of 0.  He has been doing well without chest pain or dyspnea.  He does plan to increase his activity with a goal of weight loss over the next 4 months.  He remains on statin, fibrate, and  Zetia therapy.  2.  Hyperlipidemia/hypertriglyceridemia: Previously on pravastatin therapy with a total cholesterol of 258, triglycerides of 230, HDL 46, and an LDL of 166 in February.  At that time, pravastatin was discontinued and he was placed on atorvastatin 80 mg, Tricor 48 mg, and Zetia 10 mg.  He was supposed to have follow-up labs 6 weeks afterward, but has yet to have those drawn.  He is not fasting today.  We will put in for follow-up lipids and LFTs and he will plan to come back on Friday when  he is fasting.  Ideally, goal LDL less than 100.  Would like to see him lose weight and increase activity but ultimately, may require referral to lipid clinic and or injectable agent if current regimen inadequate.  3.  Morbid obesity: As above, patient is hoping to lose 30 to 50 pounds over the next 4 months.  We discussed reasonable goals today.  He says he frequently eats sweets including cookies and donuts, and also drinks sugared sodas.  I encouraged 30 minutes of activity daily along with a more whole foods diet.  4.  Disposition: Follow-up lipids and LFTs later this week when fasting.  Follow-up in clinic in 6 months or sooner if necessary.  Murray Hodgkins, NP 02/24/2022, 3:31 PM

## 2022-02-24 NOTE — Patient Instructions (Signed)
Medication Instructions:  - Your physician recommends that you continue on your current medications as directed. Please refer to the Current Medication list given to you today.  *If you need a refill on your cardiac medications before your next appointment, please call your pharmacy*   Lab Work: - Your physician recommends that you return for FASTING lab work: at your [place] Entrance at [facility] 1st desk on the right to check in (REGISTRATION)  Lab hours: Monday- Friday (7:30 am- 5:30 pm)   If you have labs (blood work) drawn today and your tests are completely normal, you will receive your results only by: MyChart Message (if you have MyChart) OR A paper copy in the mail If you have any lab test that is abnormal or we need to change your treatment, we will call you to review the results.   Testing/Procedures: - none ordered   Follow-Up: At [facility] you and your health needs are our priority.  As part of our continuing mission to provide you with exceptional heart care, we have created designated Provider Care Teams.  These Care Teams include your primary Cardiologist (physician) and Advanced Practice Providers (APPs -  Physician Assistants and Nurse Practitioners) who all work together to provide you with the care you need, when you need it.  We recommend signing up for the patient portal called "MyChart".  Sign up information is provided on this After Visit Summary.  MyChart is used to connect with patients for Virtual Visits (Telemedicine).  Patients are able to view lab/test results, encounter notes, upcoming appointments, etc.  Non-urgent messages can be sent to your provider as well.   To learn more about what you can do with MyChart, go to [email or web address].    Your next appointment:   6 month(s)  The format for your next appointment:   In Person  Provider:   You may see Nelva Bush, MD or one of the following Advanced  Practice Providers on your designated Care Team:   Murray Hodgkins, NP    Other Instructions N/a  Important Information About Sugar

## 2022-02-26 ENCOUNTER — Other Ambulatory Visit
Admission: RE | Admit: 2022-02-26 | Discharge: 2022-02-26 | Disposition: A | Discharge status: Home / Self Care | Attending: Nurse Practitioner | Admitting: Nurse Practitioner

## 2022-02-26 ENCOUNTER — Telehealth: Payer: Self-pay

## 2022-02-26 DIAGNOSIS — E785 Hyperlipidemia, unspecified: Secondary | ICD-10-CM | POA: Diagnosis present

## 2022-02-26 LAB — HEPATIC FUNCTION PANEL
ALT: 24 U/L (ref 0–44)
AST: 26 U/L (ref 15–41)
Albumin: 4.1 g/dL (ref 3.5–5.0)
Alkaline Phosphatase: 66 U/L (ref 38–126)
Bilirubin, Direct: 0.1 mg/dL (ref 0.0–0.2)
Indirect Bilirubin: 1.1 mg/dL — ABNORMAL HIGH (ref 0.3–0.9)
Total Bilirubin: 1.2 mg/dL (ref 0.3–1.2)
Total Protein: 7.2 g/dL (ref 6.5–8.1)

## 2022-02-26 LAB — LIPID PANEL
Cholesterol: 164 mg/dL (ref 0–200)
HDL: 39 mg/dL — ABNORMAL LOW (ref >40)
LDL Cholesterol: 103 mg/dL — ABNORMAL HIGH (ref 0–99)
Total CHOL/HDL Ratio: 4.2 RATIO
Triglycerides: 110 mg/dL (ref <150)
VLDL: 22 mg/dL (ref 0–40)

## 2022-02-26 NOTE — Telephone Encounter (Signed)
-----   Message from Theora Gianotti, NP sent at 02/26/2022 12:36 PM EDT ----- Lipids much improved.  TG down from 230 to 110.  LDL down from 166 to 103.  LFTs ok.  We'd ideally like to see LDL < 100, and I think that with his plan for wt loss and exercise over the next year, he will be able to achieve that without additional medicine.

## 2022-02-26 NOTE — Telephone Encounter (Signed)
Left message for pt to call back or to review results via MyChart.

## 2022-03-17 NOTE — Telephone Encounter (Signed)
No answer and My Chart message sent to patient with results.

## 2022-04-16 ENCOUNTER — Other Ambulatory Visit: Payer: Self-pay | Admitting: Family Medicine

## 2022-04-16 DIAGNOSIS — K219 Gastro-esophageal reflux disease without esophagitis: Secondary | ICD-10-CM

## 2022-04-16 NOTE — Telephone Encounter (Signed)
Requested Prescriptions  Pending Prescriptions Disp Refills  . omeprazole (PRILOSEC) 40 MG capsule [Pharmacy Med Name: OMEPRAZOLE '40MG'$  CAPSULES] 90 capsule 0    Sig: TAKE 1 CAPSULE(40 MG) BY MOUTH DAILY BEFORE BREAKFAST     Gastroenterology: Proton Pump Inhibitors Passed - 04/16/2022  3:13 AM      Passed - Valid encounter within last 12 months    Recent Outpatient Visits          11 months ago Gastroesophageal reflux disease without esophagitis   [place], Devonne Doughty, DO   1 year ago Low testosterone in male   [place], Devonne Doughty, DO   1 year ago Annual physical exam   [facility] Olin Hauser, DO   2 years ago Essential hypertension   [facility] Olin Hauser, DO   2 years ago Annual physical exam   [facility] [place], Devonne Doughty, DO      Future Appointments            In 4 months End, Harrell Gave, MD [place]. [place]

## 2022-04-30 ENCOUNTER — Other Ambulatory Visit: Payer: Self-pay | Admitting: Family Medicine

## 2022-04-30 DIAGNOSIS — I10 Essential (primary) hypertension: Secondary | ICD-10-CM

## 2022-04-30 NOTE — Telephone Encounter (Signed)
Requested medications are due for refill today.  yes  Requested medications are on the active medications list.  yes  Last refill. 04/27/2021 #90 3 rf  Future visit scheduled.   no  Notes to clinic.  Pt is more than 3 months over for OV. Pt has missed 2 appts.    Requested Prescriptions  Pending Prescriptions Disp Refills   EDARBYCLOR 40-25 MG TABS [Pharmacy Med Name: EDARBYCLOR 40/'25MG'$  TABLETS] 90 tablet 3    Sig: TAKE 1 TABLET BY MOUTH DAILY     Cardiovascular: ARB + Diuretic Combos - azilsartan / chlorthalidone Failed - 04/30/2022  7:02 AM      Failed - Na in normal range and within 180 days    Sodium  Date Value Ref Range Status  06/08/2021 142 134 - 144 mmol/L Final         Failed - K in normal range and within 180 days    Potassium  Date Value Ref Range Status  06/08/2021 3.4 (L) 3.5 - 5.2 mmol/L Final         Failed - Cr in normal range and within 180 days    Creat  Date Value Ref Range Status  04/30/2020 1.32 0.60 - 1.35 mg/dL Final   Creatinine, Ser  Date Value Ref Range Status  06/08/2021 1.21 0.76 - 1.27 mg/dL Final         Failed - Valid encounter within last 6 months    Recent Outpatient Visits           1 year ago Gastroesophageal reflux disease without esophagitis   [facility] Olin Hauser, DO   1 year ago Low testosterone in male   [place], Devonne Doughty, DO   2 years ago Annual physical exam   [facility] Olin Hauser, DO   2 years ago Essential hypertension   [facility] Olin Hauser, DO   2 years ago Annual physical exam   [place], Devonne Doughty, DO       Future Appointments             In 3 months End, Harrell Gave, MD [place]. [place] - Patient is not pregnant      Passed - Last BP in normal range    BP Readings from  Last 1 Encounters:  02/24/22 130/82

## 2022-04-30 NOTE — Telephone Encounter (Signed)
Unable to refill per protocol, appointment needed. Will refill for 30 until OV is made. Needs OV for additional refills.   Requested Prescriptions  Pending Prescriptions Disp Refills  . EDARBYCLOR 40-25 MG TABS [Pharmacy Med Name: EDARBYCLOR 40/'25MG'$  TABLETS] 90 tablet 3    Sig: TAKE 1 TABLET BY MOUTH DAILY     Cardiovascular: ARB + Diuretic Combos - azilsartan / chlorthalidone Failed - 04/30/2022  7:11 AM      Failed - Na in normal range and within 180 days    Sodium  Date Value Ref Range Status  06/08/2021 142 134 - 144 mmol/L Final         Failed - K in normal range and within 180 days    Potassium  Date Value Ref Range Status  06/08/2021 3.4 (L) 3.5 - 5.2 mmol/L Final         Failed - Cr in normal range and within 180 days    Creat  Date Value Ref Range Status  04/30/2020 1.32 0.60 - 1.35 mg/dL Final   Creatinine, Ser  Date Value Ref Range Status  06/08/2021 1.21 0.76 - 1.27 mg/dL Final         Failed - Valid encounter within last 6 months    Recent Outpatient Visits          1 year ago Gastroesophageal reflux disease without esophagitis   [facility] Olin Hauser, DO   1 year ago Low testosterone in male   [place], Devonne Doughty, DO   2 years ago Annual physical exam   [facility] Olin Hauser, DO   2 years ago Essential hypertension   [facility] Olin Hauser, DO   2 years ago Annual physical exam   [place], Devonne Doughty, DO      Future Appointments            In 3 months End, Harrell Gave, MD [place]. [place] - Patient is not pregnant      Passed - Last BP in normal range    BP Readings from Last 1 Encounters:  02/24/22 130/82

## 2022-08-27 ENCOUNTER — Encounter: Payer: Self-pay | Admitting: Internal Medicine

## 2022-08-27 ENCOUNTER — Ambulatory Visit: Payer: Medicaid Other | Attending: Internal Medicine | Admitting: Internal Medicine

## 2022-08-27 NOTE — Progress Notes (Deleted)
Follow-up Outpatient Visit Date: 08/27/2022  Primary Care Provider: Olin Hauser, DO [address] [address]  Chief Complaint: ***  HPI:  Joseph Wilcox is a 52 y.o. male with history of coronary artery disease, hypertension, hyperlipidemia, obstructive sleep apnea, GERD, migraine headaches, depression, and anxiety, who presents for follow-up of coronary artery disease.  He was last seen in our office in 02/2022 by Ignacia Bayley, NP, at which time he was doing well.  Fasting lipid panel was recommended to assess his response to atorvastatin, fenofibrate, and ezetimibe.  Preceding coronary CTA head showed mild plaquing of the LAD.  He was no longer having chest pain.  --------------------------------------------------------------------------------------------------  Past Medical History:  Diagnosis Date   Allergy    Anxiety    Chest pain    a. 06/2021 Cor CTA: Ca2+ = 0. LAD 0-24% plaque. Otw nl cors.   Depression    GERD (gastroesophageal reflux disease)    High cholesterol    Hx of migraines    Hypertension    Obstructive sleep apnea    Past Surgical History:  Procedure Laterality Date   COLONOSCOPY WITH PROPOFOL N/A 03/09/2019   Procedure: COLONOSCOPY WITH PROPOFOL;  Surgeon: [name], MD;  Location: [facility] ENDOSCOPY;  Service: Gastroenterology;  Laterality: N/A;   ESOPHAGOGASTRODUODENOSCOPY (EGD) WITH PROPOFOL N/A 03/09/2019   Procedure: ESOPHAGOGASTRODUODENOSCOPY (EGD) WITH PROPOFOL;  Surgeon: [name], MD;  Location: [facility] ENDOSCOPY;  Service: Gastroenterology;  Laterality: N/A;   FOOT SURGERY  2002   Bone spur   TONSILLECTOMY      No outpatient medications have been marked as taking for the 08/27/22 encounter (Appointment) with Kalysta Kneisley, Joseph Gave, MD.    Allergies: Patient has no known allergies.  Social History   Tobacco Use   Smoking status: Former    Types: Cigars, Cigarettes    Quit date: 07/05/2010    Years since quitting: 12.1   Smokeless tobacco:  Former   Tobacco comments:    Smokes occasional cigar.  Vaping Use   Vaping Use: Never used  Substance Use Topics   Alcohol use: Yes    Alcohol/week: 1.0 standard drink of alcohol    Types: 1 Standard drinks or equivalent per week    Comment: One drink/week   Drug use: No    Family History  Problem Relation Age of Onset   Diabetes Mother    Heart disease Maternal Aunt        Valve replacement; congenital heart disease   Heart attack Paternal Uncle    Prostate cancer Neg Hx    Colon cancer Neg Hx     Review of Systems: A 12-system review of systems was performed and was negative except as noted in the HPI.  --------------------------------------------------------------------------------------------------  Physical Exam: There were no vitals taken for this visit.  General:  NAD. Neck: No JVD or HJR. Lungs: Clear to auscultation bilaterally without wheezes or crackles. Heart: Regular rate and rhythm without murmurs, rubs, or gallops. Abdomen: Soft, nontender, nondistended. Extremities: No lower extremity edema.  EKG:  ***  Lab Results  Component Value Date   WBC 12.4 (H) 11/02/2020   HGB 12.6 (L) 11/02/2020   HCT 38.6 (L) 11/02/2020   MCV 69.3 (L) 11/02/2020   PLT 439 (H) 11/02/2020    Lab Results  Component Value Date   NA 142 06/08/2021   K 3.4 (L) 06/08/2021   CL 102 06/08/2021   CO2 26 06/08/2021   BUN 9 06/08/2021   CREATININE 1.21 06/08/2021  GLUCOSE 77 06/08/2021   ALT 24 02/26/2022    Lab Results  Component Value Date   CHOL 164 02/26/2022   HDL 39 (L) 02/26/2022   LDLCALC 103 (H) 02/26/2022   TRIG 110 02/26/2022   CHOLHDL 4.2 02/26/2022    --------------------------------------------------------------------------------------------------  ASSESSMENT AND PLAN: Joseph Gave [name], MD 08/27/2022 6:18 AM

## 2022-10-27 ENCOUNTER — Other Ambulatory Visit: Payer: Self-pay | Admitting: Medical

## 2023-05-18 ENCOUNTER — Encounter: Payer: Self-pay | Admitting: Psychiatry

## 2023-08-16 ENCOUNTER — Encounter (HOSPITAL_BASED_OUTPATIENT_CLINIC_OR_DEPARTMENT_OTHER): Payer: Self-pay | Admitting: Internal Medicine

## 2023-08-16 DIAGNOSIS — G4769 Other sleep related movement disorders: Secondary | ICD-10-CM

## 2023-10-24 ENCOUNTER — Ambulatory Visit (HOSPITAL_BASED_OUTPATIENT_CLINIC_OR_DEPARTMENT_OTHER): Payer: Medicaid Other | Attending: Physician Assistant | Admitting: Internal Medicine

## 2023-10-24 DIAGNOSIS — G4769 Other sleep related movement disorders: Secondary | ICD-10-CM | POA: Diagnosis present

## 2023-10-24 DIAGNOSIS — G4733 Obstructive sleep apnea (adult) (pediatric): Secondary | ICD-10-CM | POA: Diagnosis not present

## 2023-10-29 DIAGNOSIS — G4769 Other sleep related movement disorders: Secondary | ICD-10-CM | POA: Diagnosis not present

## 2023-10-29 NOTE — Procedures (Signed)
 Joseph Wilcox [facility] [address] [place], [place] [address] Tel: [phone]   Fax: [phone]  Titration Interpretation  Patient Name:  Joseph Wilcox, Joseph Wilcox Date:  10/24/2023 Referring Physician:  Ulyess Gammons, Pa-C  Indications for Polysomnography: OSA and suspected movement disturbance. [facility] The patient is a 53 year-old Male who is 6\' 1"  and weighs 290.0 lbs. His BMI equals 38.4.  A full night titration treatment study was performed.  Medication taken at 2030.  MAGNESIUM  GLYCINATE   Polysomnogram Data A full night polysomnogram recorded the standard physiologic parameters including EEG, EOG, EMG, EKG, nasal and oral airflow.  Respiratory parameters of chest and abdominal movements were recorded with Respiratory Inductance Plethysmography belts.  Oxygen saturation was recorded by pulse oximetry. Leg and arm leads applied.  Sleep Architecture The total recording time of the polysomnogram was 419.3 minutes.  The total sleep time was 347.5 minutes.  The patient spent 12.2% of total sleep time in Stage N1, 73.5% in Stage N2, 2.9% in Stages N3, and 11.4% in REM.  Sleep latency was 17.1 minutes.  REM latency was 126.5 minutes.  Sleep Efficiency was 82.9%.  Wake after Sleep Onset time was 54.5 minutes.  Titration Summary The patient was titrated at pressures ranging from 5* cm/H20 with supplemental oxygen at - up to 15* cm/H20 with supplemental oxygen at -.  The last pressure used in the study was 15* cm/H20 with supplemental oxygen at -.  Respiratory Events The polysomnogram revealed a presence of - obstructive, - central, and - mixed apneas resulting in an Apnea index of - events per hour.  There were 33 hypopneas (>=3% desaturation and/or arousal) resulting in an Apnea\Hypopnea Index (AHI >=3% desaturation and/or arousal) of 5.7 events per hour.  There were 16 hypopneas (>=4% desaturation) resulting in an  Apnea\Hypopnea Index (AHI >=4% desaturation) of 2.8 events per hour.  There were 58 Respiratory Effort Related Arousals resulting in a RERA index of 10.0 events per hour. The Respiratory Disturbance Index is 15.7 events per hour.  The snore index was - events per hour.  Mean oxygen saturation was 92.2%.  The lowest oxygen saturation during sleep was 86.0%.  Time spent <=88% oxygen saturation was 8.4 minutes (2.0%).  Limb Activity There were 23 limb movements recorded.  Of this total, 21 were classified as PLMs.  Of the PLMs, - were associated with arousals.  The Limb Movement index was 4.0 per hour while the PLM index was 3.6 per hour.  Cardiac Summary The average pulse rate was 71.8 bpm.  The minimum pulse rate was 61.0 bpm while the maximum pulse rate was 88.0 bpm.  Cardiac rhythm was normal with frequent PVCs.  Comment: CPAP titration to 15 cwp with residual AHI (4%) 0/hr and minimum O2 saturation 91%. Occasional limb movements but none qualifying as REM Behavior Disorder and no significant parasomnias on this study night.  Diagnosis: Obstructive sleep apnea  Recommendations: Suggest autopap 10-20 or fixed CPAP 15, EPR 2. Patient wore a small wide ResMed AirFit N30i nasal mask with heated humidification   This study was personally reviewed and electronically signed by: Dr. [employer] Accredited Board Certified in Sleep Medicine Date/Time: 10/29/23  2:564   Titration Report  Patient Name: Joseph Wilcox Study Date: 10/24/2023  Date of Birth: Mar 26, 1971 Study Type: REM Behavior  Age: 76 year MRN #: [id number]  Sex: Male Interpreting Physician: [employer] [id number]  Height: 6\' 1"  Referring Physician: Ulyess Gammons,  Pa-C  Weight: 290.0 lbs Recording Tech: Charlsie Cool CRT RPSGT RST  BMI: 38.4 Scoring Tech: Charlsie Cool CRT RPSGT RST  ESS: 0 Neck Size: 19.5  Mask Type ResMed Airfit N30i Final Pressure: 15  Mask Size: Small Wide Supplemental O2: N/A   Study  Overview  Lights Off: 10:03:01 PM  Count Index  Lights On: 05:02:18 AM Awakenings: 24 4.1  Time in Bed: 419.3 min. Arousals: 156 26.9  Total Sleep Time: 347.5 min. AHI (>=3% Desat and/or Ar.): 33 5.7   Sleep Efficiency: 82.9% AHI (>=4% Desat): 16 2.8   Sleep Latency: 17.1 min. Limb Movements: 23 4.0  Wake After Sleep Onset: 54.5 min. Snore: - -  REM Latency from Sleep Onset: 126.5 min. Desaturations: 53 9.2     Minimum SpO2 TST: 86.0%    Sleep Architecture  % of Time in Bed Stages Time (mins) % Sleep Time  Wake 72.5   Stage N1 42.5 12.2%  Stage N2 255.5 73.5%  Stage N3 10.0 2.9%  REM 39.5 11.4%   Arousal Summary   NREM REM Sleep Index  Respiratory Arousals 73 3 76 13.1  PLM Arousals - - - -  Isolated Limb Movement Arousals 2 - 2 0.3  Snore Arousals - - - -  Spontaneous Arousals 68 10 78 13.5  Total 143 13 156 26.9   Limb Movement Summary   Count Index  Isolated Limb Movements 2 0.3  Periodic Limb Movements (PLMs) 21 3.6  Total Limb Movements 23 4.0    Respiratory Summary   By Sleep Stage By Body Position Total   NREM REM Supine Non-Supine   Time (min) 308.0 39.5 92.5 255.0 347.5         Obstructive Apnea - - - - -  Mixed Apnea - - - - -  Central Apnea - - - - -  Total Apneas - - - - -  Total Apnea Index - - - - -         Hypopneas (>=3% Desat and/or Ar.) 31 2 9 24  33  AHI (>=3% Desat and/or Ar.) 6.0 3.0 5.8 5.6 5.7         Hypopneas (>=4% Desat) 15 1 5 11 16   AHI (>=4% Desat) 2.9 1.5 3.2 2.6 2.8          RERAs 55 3 15 43 58  RERA Index 10.7 4.6 9.7 10.1 10.0         RDI 16.8 7.6 15.6 15.8 15.7     Respiratory Event Durations   Apnea Hypopnea   NREM REM NREM REM  Average (seconds) - - 19.3 22.3  Maximum (seconds) - - 34.6 27.9    Oxygen Saturation Summary   Wake NREM REM TST TIB  Average SpO2 93.3% 91.8% 93.3% 92.0% 92.2%  Minimum SpO2 87.0% 86.0% 89.0% 86.0% 86.0%  Maximum SpO2 97.0% 97.0% 95.0% 97.0% 97.0%   Oxygen Saturation  Distribution  Range (%) Time in range (min) Time in range (%)  90.0 - 100.0 346.5 82.6%  80.0 - 90.0 72.8 17.4%  70.0 - 80.0 - -  60.0 - 70.0 - -  50.0 - 60.0 - -  0.0 - 50.0 - -  Time Spent <=88% SpO2  Range (%) Time in range (min) Time in range (%)  0.0 - 88.0 8.4 2.0%      Count Index  Desaturations 53 9.2    Cardiac Summary   Wake NREM REM Sleep Total  Average Pulse Rate (BPM) 71.3 71.9 72.1  71.9 71.8  Minimum Pulse Rate (BPM) 62.0 63.0 61.0 61.0 61.0  Maximum Pulse Rate (BPM) 88.0 84.0 85.0 85.0 88.0   Pulse Rate Distribution:  Range (bpm) Time in range (min) Time in range (%)  0.0 - 40.0 - -  40.0 - 60.0 - -  60.0 - 80.0 414.8 98.8%  80.0 - 100.0 5.0 1.2%  100.0 - 120.0 - -  120.0 - 140.0 - -  140.0 - 200.0 - -   Titration Summary  PAP Device PAP Level O2 Level Time (min) Wake (min) NREM (min) REM (min) Sleep Eff% OA# CA# MA# Hyp# (>=3%) AHI (>=3%) Hyp# (>=4%) AHI (>=%4) RERA RDI SpO2 <=88% (min) Min SpO2 Mean SpO2 Ar. Index  - Off - 0.5 0.5 0.0 0.0 0.0%               CPAP 5 - 38.5 17.5 21.0 0.0 54.5% - - - 4 11.4 1  2.9 4  22.9  0.1 88.0 90.8 14.3  CPAP 7 - 11.5 0.0 11.5 0.0 100.0% - - - 1 5.2 1  5.2 -  5.2  0.3 86.0 89.9 5.2  CPAP 8 - 74.5 2.0 72.5 0.0 97.3% - - - 16 13.2 10  8.3 24  33.1  7.3 87.0 90.1 39.7  CPAP 10 - 11.5 1.0 10.5 0.0 91.3% - - - 1 5.7 -  - 8  51.4  0.0 89.0 92.0 85.7  CPAP 12 - 33.5 11.5 19.0 3.0 65.7% - - - 5 13.6 2  5.5 2  19.1  0.0 89.0 91.8 40.9  CPAP 13 - 74.5 6.0 68.5 0.0 91.9% - - - 3 2.6 1  0.9 11  12.3  0.1 87.0 91.6 17.5  CPAP 14 - 81.5 30.0 26.5 25.0 63.2% - - - 3 3.5 1  1.2 8  12.8  0.0 90.0 92.9 36.1  CPAP 15 - 94.0 4.0 78.5 11.5 95.7% - - - - - -  - 1  0.7  0.0 91.0 94.0 14.0    Hypnograms                           Technologist Comments  THE 49-YEAR-OLD MALE PATIENT PRESENTED TO THE SLEEP DISORDER CENTER FOR A CPAP TITRATION STUDY WITH ARM LEADS FOR A CHIEF COMPLAINT OF OSA AND TO R/O RBD. THE  PATIENT WAS FITTED WITH A SMALL WIDE RESMED AIRFIT N30i NASAL MASK FOR PRE-CPAP TRIAL, WHICH WAS TOLERATED WELL. ALL BEDTIME MEDICATIONS WERE SELF ADMINISTERED AT 2030. THE LEAD PLACEMENT WAS INITIATED, THEN THE STUDY WAS BEGUN. THE CPAP TRIAL WAS INITIATED USING THE ABOVE MASK TYPE ON A PRESSURE OF 5cmH2O WITH AN EPR OF 2 AND HEATED HUMIDIFICATION. THE CPAP PRESSURE WAS INCREASED FOR SNORING, RESPIRATORY EVENTS AND RERAs UNTIL OPTIMAL PRESSURE WAS ACHIEVED. SUPPLEMENTAL OXYGEN WAS NOT WARRANTED DURING THE STUDY. PLMs - PLMAs WERE NOTED IN REM. NO RESTROOM VISITS WERE MADE. NO OBVIOUS PARASOMNIAs WERE OBSERVED. QUESTIONABLE CARDIAC ARRHYTHMIAS WERE OBSERVED THROUGHOUT THE STUDY. PLEASE SEE ATTACHED SHEETS. THE PATIENT TOLERATED THE CPAP TRIAL WELL. -TECH                         Ambre Kobayashi Garret Kales, Biomedical engineer of Sleep Medicine  ELECTRONICALLY SIGNED ON:  10/29/2023, 2:43 PM [facility] PH: [phone]   FX: [phone] ACCREDITED BY THE [employer] SLEEP [employer]IC[employer]

## 2024-02-07 NOTE — Procedures (Signed)
 SABRA
